# Patient Record
Sex: Male | Born: 1935 | Race: White | Hispanic: No | Marital: Married | Smoking: Former smoker
Health system: Southern US, Community
[De-identification: ages and names within clinical notes are randomized; demographics above are authoritative.]

## PROBLEM LIST (undated history)

## (undated) DIAGNOSIS — D649 Anemia, unspecified: Secondary | ICD-10-CM

## (undated) DIAGNOSIS — Z8701 Personal history of pneumonia (recurrent): Secondary | ICD-10-CM

## (undated) DIAGNOSIS — I2510 Atherosclerotic heart disease of native coronary artery without angina pectoris: Secondary | ICD-10-CM

## (undated) DIAGNOSIS — I10 Essential (primary) hypertension: Secondary | ICD-10-CM

## (undated) DIAGNOSIS — F17200 Nicotine dependence, unspecified, uncomplicated: Secondary | ICD-10-CM

## (undated) DIAGNOSIS — E538 Deficiency of other specified B group vitamins: Secondary | ICD-10-CM

## (undated) DIAGNOSIS — J449 Chronic obstructive pulmonary disease, unspecified: Secondary | ICD-10-CM

## (undated) DIAGNOSIS — I5022 Chronic systolic (congestive) heart failure: Secondary | ICD-10-CM

## (undated) DIAGNOSIS — K5730 Diverticulosis of large intestine without perforation or abscess without bleeding: Secondary | ICD-10-CM

## (undated) DIAGNOSIS — E785 Hyperlipidemia, unspecified: Secondary | ICD-10-CM

## (undated) DIAGNOSIS — M1990 Unspecified osteoarthritis, unspecified site: Secondary | ICD-10-CM

## (undated) DIAGNOSIS — N183 Chronic kidney disease, stage 3 unspecified: Secondary | ICD-10-CM

## (undated) DIAGNOSIS — F1010 Alcohol abuse, uncomplicated: Secondary | ICD-10-CM

## (undated) DIAGNOSIS — I255 Ischemic cardiomyopathy: Secondary | ICD-10-CM

## (undated) DIAGNOSIS — D696 Thrombocytopenia, unspecified: Secondary | ICD-10-CM

## (undated) DIAGNOSIS — I2781 Cor pulmonale (chronic): Secondary | ICD-10-CM

## (undated) HISTORY — DX: Thrombocytopenia, unspecified: D69.6

## (undated) HISTORY — DX: Hyperlipidemia, unspecified: E78.5

## (undated) HISTORY — DX: Chronic obstructive pulmonary disease, unspecified: J44.9

## (undated) HISTORY — DX: Anemia, unspecified: D64.9

## (undated) HISTORY — PX: MRI: SHX5353

## (undated) HISTORY — DX: Alcohol abuse, uncomplicated: F10.10

## (undated) HISTORY — PX: ESOPHAGOGASTRODUODENOSCOPY: SHX1529

## (undated) HISTORY — DX: Deficiency of other specified B group vitamins: E53.8

## (undated) HISTORY — DX: Diverticulosis of large intestine without perforation or abscess without bleeding: K57.30

## (undated) HISTORY — DX: Cor pulmonale (chronic): I27.81

## (undated) HISTORY — DX: Essential (primary) hypertension: I10

## (undated) HISTORY — DX: Unspecified osteoarthritis, unspecified site: M19.90

## (undated) HISTORY — DX: Nicotine dependence, unspecified, uncomplicated: F17.200

## (undated) HISTORY — DX: Atherosclerotic heart disease of native coronary artery without angina pectoris: I25.10

## (undated) HISTORY — PX: CARDIAC CATHETERIZATION: SHX172

---

## 1999-06-15 ENCOUNTER — Inpatient Hospital Stay (HOSPITAL_COMMUNITY): Admission: AD | Admit: 1999-06-15 | Discharge: 1999-06-22 | Payer: Self-pay | Admitting: Cardiology

## 1999-10-13 ENCOUNTER — Encounter: Payer: Self-pay | Admitting: Family Medicine

## 1999-10-13 ENCOUNTER — Ambulatory Visit (HOSPITAL_COMMUNITY): Admission: RE | Admit: 1999-10-13 | Discharge: 1999-10-13 | Payer: Self-pay | Admitting: Family Medicine

## 2000-01-01 ENCOUNTER — Inpatient Hospital Stay (HOSPITAL_COMMUNITY): Admission: EM | Admit: 2000-01-01 | Discharge: 2000-01-09 | Payer: Self-pay | Admitting: Emergency Medicine

## 2000-01-01 ENCOUNTER — Encounter: Payer: Self-pay | Admitting: Family Medicine

## 2000-11-26 ENCOUNTER — Encounter: Payer: Self-pay | Admitting: *Deleted

## 2000-11-26 ENCOUNTER — Inpatient Hospital Stay (HOSPITAL_COMMUNITY): Admission: EM | Admit: 2000-11-26 | Discharge: 2000-11-30 | Payer: Self-pay | Admitting: *Deleted

## 2000-11-27 ENCOUNTER — Encounter: Payer: Self-pay | Admitting: Cardiology

## 2000-11-29 ENCOUNTER — Encounter: Payer: Self-pay | Admitting: *Deleted

## 2002-04-15 ENCOUNTER — Ambulatory Visit (HOSPITAL_BASED_OUTPATIENT_CLINIC_OR_DEPARTMENT_OTHER): Admission: RE | Admit: 2002-04-15 | Discharge: 2002-04-15 | Payer: Self-pay | Admitting: *Deleted

## 2002-04-15 ENCOUNTER — Encounter (INDEPENDENT_AMBULATORY_CARE_PROVIDER_SITE_OTHER): Payer: Self-pay | Admitting: Specialist

## 2003-03-09 ENCOUNTER — Encounter: Payer: Self-pay | Admitting: Family Medicine

## 2003-03-09 ENCOUNTER — Encounter: Admission: RE | Admit: 2003-03-09 | Discharge: 2003-03-09 | Payer: Self-pay | Admitting: Family Medicine

## 2004-02-15 ENCOUNTER — Encounter: Payer: Self-pay | Admitting: Family Medicine

## 2004-07-04 ENCOUNTER — Encounter: Admission: RE | Admit: 2004-07-04 | Discharge: 2004-07-04 | Payer: Self-pay | Admitting: Nephrology

## 2004-12-14 ENCOUNTER — Ambulatory Visit: Payer: Self-pay | Admitting: Family Medicine

## 2004-12-14 ENCOUNTER — Encounter: Admission: RE | Admit: 2004-12-14 | Discharge: 2004-12-14 | Payer: Self-pay | Admitting: Family Medicine

## 2005-01-18 ENCOUNTER — Ambulatory Visit: Payer: Self-pay | Admitting: Family Medicine

## 2005-03-09 ENCOUNTER — Inpatient Hospital Stay (HOSPITAL_COMMUNITY): Admission: EM | Admit: 2005-03-09 | Discharge: 2005-03-16 | Payer: Self-pay | Admitting: *Deleted

## 2005-03-09 ENCOUNTER — Ambulatory Visit: Payer: Self-pay | Admitting: Internal Medicine

## 2005-03-11 ENCOUNTER — Ambulatory Visit: Payer: Self-pay | Admitting: Internal Medicine

## 2005-03-14 ENCOUNTER — Encounter (INDEPENDENT_AMBULATORY_CARE_PROVIDER_SITE_OTHER): Payer: Self-pay | Admitting: Specialist

## 2005-03-30 ENCOUNTER — Ambulatory Visit: Payer: Self-pay | Admitting: Family Medicine

## 2005-03-31 ENCOUNTER — Ambulatory Visit: Payer: Self-pay | Admitting: Cardiovascular Disease

## 2005-07-31 ENCOUNTER — Ambulatory Visit: Payer: Self-pay | Admitting: Family Medicine

## 2005-09-14 ENCOUNTER — Ambulatory Visit: Payer: Self-pay | Admitting: Family Medicine

## 2005-10-04 ENCOUNTER — Ambulatory Visit: Payer: Self-pay | Admitting: Cardiovascular Disease

## 2005-11-03 ENCOUNTER — Ambulatory Visit: Payer: Self-pay | Admitting: Family Medicine

## 2006-03-26 ENCOUNTER — Ambulatory Visit: Payer: Self-pay | Admitting: Cardiovascular Disease

## 2006-03-27 ENCOUNTER — Ambulatory Visit: Payer: Self-pay | Admitting: Internal Medicine

## 2006-05-08 ENCOUNTER — Ambulatory Visit: Payer: Self-pay | Admitting: Internal Medicine

## 2006-09-12 ENCOUNTER — Ambulatory Visit: Payer: Self-pay | Admitting: Family Medicine

## 2006-09-20 ENCOUNTER — Ambulatory Visit: Payer: Self-pay | Admitting: Cardiovascular Disease

## 2006-12-21 ENCOUNTER — Ambulatory Visit: Payer: Self-pay | Admitting: Family Medicine

## 2007-02-13 ENCOUNTER — Ambulatory Visit: Payer: Self-pay | Admitting: Family Medicine

## 2007-03-05 ENCOUNTER — Ambulatory Visit: Payer: Self-pay | Admitting: Family Medicine

## 2007-03-05 LAB — CONVERTED CEMR LAB
Albumin: 3.3 g/dL — ABNORMAL LOW (ref 3.5–5.2)
BUN: 28 mg/dL — ABNORMAL HIGH (ref 6–23)
Basophils Absolute: 0 10*3/uL (ref 0.0–0.1)
CO2: 34 meq/L — ABNORMAL HIGH (ref 19–32)
Calcium: 8.7 mg/dL (ref 8.4–10.5)
Cholesterol: 144 mg/dL (ref 0–200)
Creatinine, Ser: 1.5 mg/dL (ref 0.4–1.5)
Eosinophils Absolute: 0.3 10*3/uL (ref 0.0–0.6)
Eosinophils Relative: 4.6 % (ref 0.0–5.0)
GFR calc non Af Amer: 48 mL/min
Glucose, Bld: 106 mg/dL — ABNORMAL HIGH (ref 70–99)
HCT: 38.5 % — ABNORMAL LOW (ref 39.0–52.0)
HDL: 65.7 mg/dL (ref >39.0)
Lymphocytes Relative: 17.2 % (ref 12.0–46.0)
MCHC: 33.5 g/dL (ref 30.0–36.0)
Neutro Abs: 4.1 10*3/uL (ref 1.4–7.7)
Neutrophils Relative %: 68.8 % (ref 43.0–77.0)
Phosphorus: 3.4 mg/dL (ref 2.3–4.6)
Sodium: 143 meq/L (ref 135–145)
Triglycerides: 95 mg/dL (ref 0–149)
VLDL: 19 mg/dL (ref 0–40)
WBC: 5.9 10*3/uL (ref 4.5–10.5)

## 2007-08-23 ENCOUNTER — Ambulatory Visit: Payer: Self-pay | Admitting: Family Medicine

## 2007-10-08 ENCOUNTER — Telehealth: Payer: Self-pay | Admitting: Family Medicine

## 2007-10-21 ENCOUNTER — Encounter: Payer: Self-pay | Admitting: Family Medicine

## 2007-10-21 DIAGNOSIS — M1990 Unspecified osteoarthritis, unspecified site: Secondary | ICD-10-CM | POA: Insufficient documentation

## 2007-10-21 DIAGNOSIS — F17200 Nicotine dependence, unspecified, uncomplicated: Secondary | ICD-10-CM

## 2007-10-21 DIAGNOSIS — I2510 Atherosclerotic heart disease of native coronary artery without angina pectoris: Secondary | ICD-10-CM

## 2007-10-21 DIAGNOSIS — I10 Essential (primary) hypertension: Secondary | ICD-10-CM

## 2007-10-21 DIAGNOSIS — F1011 Alcohol abuse, in remission: Secondary | ICD-10-CM

## 2007-10-21 DIAGNOSIS — M109 Gout, unspecified: Secondary | ICD-10-CM

## 2007-10-21 DIAGNOSIS — I279 Pulmonary heart disease, unspecified: Secondary | ICD-10-CM | POA: Insufficient documentation

## 2007-10-21 DIAGNOSIS — H9190 Unspecified hearing loss, unspecified ear: Secondary | ICD-10-CM | POA: Insufficient documentation

## 2007-10-21 DIAGNOSIS — D649 Anemia, unspecified: Secondary | ICD-10-CM

## 2007-10-21 DIAGNOSIS — K5730 Diverticulosis of large intestine without perforation or abscess without bleeding: Secondary | ICD-10-CM | POA: Insufficient documentation

## 2007-10-21 DIAGNOSIS — J439 Emphysema, unspecified: Secondary | ICD-10-CM

## 2007-10-21 DIAGNOSIS — N289 Disorder of kidney and ureter, unspecified: Secondary | ICD-10-CM

## 2007-10-22 ENCOUNTER — Ambulatory Visit (HOSPITAL_COMMUNITY): Admission: RE | Admit: 2007-10-22 | Discharge: 2007-10-22 | Payer: Self-pay | Admitting: Rheumatology

## 2007-10-22 ENCOUNTER — Ambulatory Visit: Payer: Self-pay | Admitting: Vascular Surgery

## 2007-10-22 ENCOUNTER — Encounter: Payer: Self-pay | Admitting: Family Medicine

## 2007-10-22 ENCOUNTER — Encounter (INDEPENDENT_AMBULATORY_CARE_PROVIDER_SITE_OTHER): Payer: Self-pay | Admitting: Rheumatology

## 2007-11-01 ENCOUNTER — Encounter: Payer: Self-pay | Admitting: Family Medicine

## 2007-11-13 ENCOUNTER — Ambulatory Visit: Payer: Self-pay | Admitting: Family Medicine

## 2008-01-16 ENCOUNTER — Telehealth: Payer: Self-pay | Admitting: Family Medicine

## 2008-01-22 ENCOUNTER — Encounter: Payer: Self-pay | Admitting: Internal Medicine

## 2008-01-27 ENCOUNTER — Telehealth (INDEPENDENT_AMBULATORY_CARE_PROVIDER_SITE_OTHER): Payer: Self-pay | Admitting: *Deleted

## 2008-01-28 ENCOUNTER — Ambulatory Visit: Payer: Self-pay | Admitting: Family Medicine

## 2008-03-03 ENCOUNTER — Encounter: Payer: Self-pay | Admitting: Family Medicine

## 2008-03-13 ENCOUNTER — Telehealth: Payer: Self-pay | Admitting: Family Medicine

## 2008-06-16 ENCOUNTER — Telehealth: Payer: Self-pay | Admitting: Family Medicine

## 2008-06-29 ENCOUNTER — Encounter: Payer: Self-pay | Admitting: Family Medicine

## 2008-07-03 ENCOUNTER — Encounter: Payer: Self-pay | Admitting: Family Medicine

## 2008-08-11 ENCOUNTER — Ambulatory Visit: Payer: Self-pay | Admitting: Family Medicine

## 2008-10-05 ENCOUNTER — Encounter: Payer: Self-pay | Admitting: Internal Medicine

## 2008-10-12 ENCOUNTER — Encounter: Payer: Self-pay | Admitting: Family Medicine

## 2008-12-28 ENCOUNTER — Encounter: Payer: Self-pay | Admitting: Family Medicine

## 2009-01-06 ENCOUNTER — Telehealth (INDEPENDENT_AMBULATORY_CARE_PROVIDER_SITE_OTHER): Payer: Self-pay | Admitting: *Deleted

## 2009-01-14 ENCOUNTER — Telehealth (INDEPENDENT_AMBULATORY_CARE_PROVIDER_SITE_OTHER): Payer: Self-pay | Admitting: *Deleted

## 2009-01-19 ENCOUNTER — Telehealth (INDEPENDENT_AMBULATORY_CARE_PROVIDER_SITE_OTHER): Payer: Self-pay | Admitting: *Deleted

## 2009-01-22 ENCOUNTER — Encounter: Payer: Self-pay | Admitting: Internal Medicine

## 2009-02-02 ENCOUNTER — Ambulatory Visit: Payer: Self-pay | Admitting: Family Medicine

## 2009-02-05 LAB — CONVERTED CEMR LAB
ALT: 13 units/L (ref 0–53)
Albumin: 3.4 g/dL — ABNORMAL LOW (ref 3.5–5.2)
Basophils Relative: 0.1 % (ref 0.0–3.0)
Cholesterol: 119 mg/dL (ref 0–200)
Creatinine, Ser: 1.5 mg/dL (ref 0.4–1.5)
Glucose, Bld: 107 mg/dL — ABNORMAL HIGH (ref 70–99)
HDL: 64.1 mg/dL (ref >39.00)
Lymphocytes Relative: 14.4 % (ref 12.0–46.0)
Lymphs Abs: 0.8 10*3/uL (ref 0.7–4.0)
Monocytes Absolute: 0.6 10*3/uL (ref 0.1–1.0)
Neutro Abs: 4.4 10*3/uL (ref 1.4–7.7)
Neutrophils Relative %: 74.1 % (ref 43.0–77.0)
Phosphorus: 3.5 mg/dL (ref 2.3–4.6)
Potassium: 4.7 meq/L (ref 3.5–5.1)
Sodium: 143 meq/L (ref 135–145)
TSH: 1.69 microintl units/mL (ref 0.35–5.50)
Total Bilirubin: 0.9 mg/dL (ref 0.3–1.2)
Total CHOL/HDL Ratio: 2
Total Protein: 5.7 g/dL — ABNORMAL LOW (ref 6.0–8.3)
Triglycerides: 51 mg/dL (ref 0.0–149.0)
VLDL: 10.2 mg/dL (ref 0.0–40.0)

## 2009-02-16 ENCOUNTER — Telehealth: Payer: Self-pay | Admitting: Family Medicine

## 2009-02-19 ENCOUNTER — Ambulatory Visit: Payer: Self-pay | Admitting: Family Medicine

## 2009-02-19 DIAGNOSIS — D696 Thrombocytopenia, unspecified: Secondary | ICD-10-CM | POA: Insufficient documentation

## 2009-02-22 LAB — CONVERTED CEMR LAB
Basophils Relative: 0.3 % (ref 0.0–3.0)
Eosinophils Relative: 2.5 % (ref 0.0–5.0)
HCT: 36.1 % — ABNORMAL LOW (ref 39.0–52.0)
Lymphs Abs: 1.1 10*3/uL (ref 0.7–4.0)
Neutro Abs: 4.4 10*3/uL (ref 1.4–7.7)
Platelets: 124 10*3/uL — ABNORMAL LOW (ref 150.0–400.0)
RDW: 13.8 % (ref 11.5–14.6)

## 2009-02-24 ENCOUNTER — Ambulatory Visit: Payer: Self-pay | Admitting: Oncology

## 2009-03-17 ENCOUNTER — Encounter: Payer: Self-pay | Admitting: Family Medicine

## 2009-03-17 LAB — CMP (CANCER CENTER ONLY)
ALT(SGPT): 9 U/L — ABNORMAL LOW (ref 10–47)
AST: 21 U/L (ref 11–38)
Albumin: 3.3 g/dL (ref 3.3–5.5)
Alkaline Phosphatase: 61 U/L (ref 26–84)
Glucose, Bld: 109 mg/dL (ref 73–118)
Potassium: 4 mEq/L (ref 3.3–4.7)
Sodium: 139 mEq/L (ref 128–145)
Total Bilirubin: 0.8 mg/dl (ref 0.20–1.60)
Total Protein: 5.8 g/dL — ABNORMAL LOW (ref 6.4–8.1)

## 2009-03-17 LAB — CBC WITH DIFFERENTIAL (CANCER CENTER ONLY)
BASO%: 0.3 % (ref 0.0–2.0)
HCT: 36.1 % — ABNORMAL LOW (ref 38.7–49.9)
LYMPH%: 18.6 % (ref 14.0–48.0)
MCH: 32.3 pg (ref 28.0–33.4)
MCHC: 34.3 g/dL (ref 32.0–35.9)
MCV: 94 fL (ref 82–98)
MONO#: 0.5 10*3/uL (ref 0.1–0.9)
MONO%: 7.3 % (ref 0.0–13.0)
NEUT%: 72 % (ref 40.0–80.0)
Platelets: 131 10*3/uL — ABNORMAL LOW (ref 145–400)
RDW: 12.4 % (ref 10.5–14.6)
WBC: 6.3 10*3/uL (ref 4.0–10.0)

## 2009-03-17 LAB — MORPHOLOGY - CHCC SATELLITE

## 2009-03-19 LAB — RETICULOCYTES (CHCC)
ABS Retic: 31.4 10*3/uL (ref 19.0–186.0)
RBC.: 3.92 MIL/uL — ABNORMAL LOW (ref 4.22–5.81)
Retic Ct Pct: 0.8 % (ref 0.4–3.1)

## 2009-03-19 LAB — PROTEIN ELECTROPHORESIS, SERUM
Albumin ELP: 65.9 % (ref 55.8–66.1)
Total Protein, Serum Electrophoresis: 5.5 g/dL — ABNORMAL LOW (ref 6.0–8.3)

## 2009-03-19 LAB — FOLATE: Folate: 10 ng/mL

## 2009-03-19 LAB — IRON AND TIBC: %SAT: 43 % (ref 20–55)

## 2009-03-19 LAB — VITAMIN B12: Vitamin B-12: 179 pg/mL — ABNORMAL LOW (ref 211–911)

## 2009-03-23 ENCOUNTER — Ambulatory Visit (HOSPITAL_COMMUNITY): Admission: RE | Admit: 2009-03-23 | Discharge: 2009-03-23 | Payer: Self-pay | Admitting: Oncology

## 2009-03-24 ENCOUNTER — Encounter: Payer: Self-pay | Admitting: Internal Medicine

## 2009-03-31 ENCOUNTER — Encounter: Payer: Self-pay | Admitting: Internal Medicine

## 2009-04-02 ENCOUNTER — Encounter: Payer: Self-pay | Admitting: Internal Medicine

## 2009-04-09 ENCOUNTER — Ambulatory Visit: Payer: Self-pay | Admitting: Oncology

## 2009-04-14 ENCOUNTER — Encounter: Payer: Self-pay | Admitting: Family Medicine

## 2009-04-14 LAB — CBC WITH DIFFERENTIAL (CANCER CENTER ONLY)
BASO#: 0 10*3/uL (ref 0.0–0.2)
Eosinophils Absolute: 0.2 10*3/uL (ref 0.0–0.5)
HCT: 36.1 % — ABNORMAL LOW (ref 38.7–49.9)
HGB: 12.4 g/dL — ABNORMAL LOW (ref 13.0–17.1)
LYMPH%: 20.4 % (ref 14.0–48.0)
MCH: 31.7 pg (ref 28.0–33.4)
MCV: 92 fL (ref 82–98)
MONO#: 0.5 10*3/uL (ref 0.1–0.9)
MONO%: 7.8 % (ref 0.0–13.0)
NEUT%: 68.7 % (ref 40.0–80.0)
Platelets: 132 10*3/uL — ABNORMAL LOW (ref 145–400)
RBC: 3.91 10*6/uL — ABNORMAL LOW (ref 4.20–5.70)
WBC: 6.9 10*3/uL (ref 4.0–10.0)

## 2009-04-16 ENCOUNTER — Ambulatory Visit (HOSPITAL_COMMUNITY): Admission: RE | Admit: 2009-04-16 | Discharge: 2009-04-16 | Payer: Self-pay | Admitting: Oncology

## 2009-05-11 ENCOUNTER — Ambulatory Visit: Payer: Self-pay | Admitting: Oncology

## 2009-05-31 ENCOUNTER — Telehealth: Payer: Self-pay | Admitting: Family Medicine

## 2009-06-10 ENCOUNTER — Ambulatory Visit: Payer: Self-pay | Admitting: Oncology

## 2009-07-04 ENCOUNTER — Inpatient Hospital Stay (HOSPITAL_COMMUNITY): Admission: EM | Admit: 2009-07-04 | Discharge: 2009-07-10 | Payer: Self-pay | Admitting: Emergency Medicine

## 2009-07-04 ENCOUNTER — Ambulatory Visit: Payer: Self-pay | Admitting: Cardiology

## 2009-07-07 ENCOUNTER — Ambulatory Visit: Payer: Self-pay | Admitting: Internal Medicine

## 2009-07-07 ENCOUNTER — Encounter: Payer: Self-pay | Admitting: Cardiovascular Disease

## 2009-07-07 ENCOUNTER — Encounter: Payer: Self-pay | Admitting: Emergency Medicine

## 2009-07-08 ENCOUNTER — Ambulatory Visit: Payer: Self-pay | Admitting: Internal Medicine

## 2009-07-08 ENCOUNTER — Encounter: Payer: Self-pay | Admitting: Family Medicine

## 2009-07-08 ENCOUNTER — Telehealth (INDEPENDENT_AMBULATORY_CARE_PROVIDER_SITE_OTHER): Payer: Self-pay

## 2009-07-13 ENCOUNTER — Ambulatory Visit: Payer: Self-pay | Admitting: Oncology

## 2009-07-14 ENCOUNTER — Encounter: Payer: Self-pay | Admitting: Family Medicine

## 2009-07-14 LAB — CBC WITH DIFFERENTIAL (CANCER CENTER ONLY)
BASO#: 0 10*3/uL (ref 0.0–0.2)
Eosinophils Absolute: 0.3 10*3/uL (ref 0.0–0.5)
HGB: 10.6 g/dL — ABNORMAL LOW (ref 13.0–17.1)
MCH: 30.7 pg (ref 28.0–33.4)
MONO#: 0.4 10*3/uL (ref 0.1–0.9)
MONO%: 6.7 % (ref 0.0–13.0)
NEUT#: 3.9 10*3/uL (ref 1.5–6.5)
Platelets: 170 10*3/uL (ref 145–400)
RBC: 3.46 10*6/uL — ABNORMAL LOW (ref 4.20–5.70)
WBC: 5.7 10*3/uL (ref 4.0–10.0)

## 2009-07-23 ENCOUNTER — Ambulatory Visit: Payer: Self-pay | Admitting: Family Medicine

## 2009-07-26 ENCOUNTER — Telehealth (INDEPENDENT_AMBULATORY_CARE_PROVIDER_SITE_OTHER): Payer: Self-pay | Admitting: Radiology

## 2009-07-27 ENCOUNTER — Ambulatory Visit: Payer: Self-pay

## 2009-07-27 ENCOUNTER — Encounter: Payer: Self-pay | Admitting: Cardiology

## 2009-07-29 DIAGNOSIS — I214 Non-ST elevation (NSTEMI) myocardial infarction: Secondary | ICD-10-CM

## 2009-07-29 DIAGNOSIS — I428 Other cardiomyopathies: Secondary | ICD-10-CM | POA: Insufficient documentation

## 2009-07-29 DIAGNOSIS — E538 Deficiency of other specified B group vitamins: Secondary | ICD-10-CM

## 2009-07-29 DIAGNOSIS — E785 Hyperlipidemia, unspecified: Secondary | ICD-10-CM | POA: Insufficient documentation

## 2009-07-30 ENCOUNTER — Ambulatory Visit: Payer: Self-pay | Admitting: Cardiovascular Disease

## 2009-08-11 ENCOUNTER — Ambulatory Visit: Payer: Self-pay | Admitting: Oncology

## 2009-08-13 ENCOUNTER — Encounter: Payer: Self-pay | Admitting: Family Medicine

## 2009-08-13 LAB — CBC WITH DIFFERENTIAL (CANCER CENTER ONLY)
BASO#: 0 10*3/uL (ref 0.0–0.2)
EOS%: 4.8 % (ref 0.0–7.0)
Eosinophils Absolute: 0.3 10*3/uL (ref 0.0–0.5)
HGB: 12.2 g/dL — ABNORMAL LOW (ref 13.0–17.1)
LYMPH#: 1.2 10*3/uL (ref 0.9–3.3)
NEUT#: 4.3 10*3/uL (ref 1.5–6.5)
Platelets: 121 10*3/uL — ABNORMAL LOW (ref 145–400)
RBC: 3.95 10*6/uL — ABNORMAL LOW (ref 4.20–5.70)

## 2009-08-13 LAB — BASIC METABOLIC PANEL - CANCER CENTER ONLY
CO2: 34 mEq/L — ABNORMAL HIGH (ref 18–33)
Calcium: 9.3 mg/dL (ref 8.0–10.3)
Chloride: 100 mEq/L (ref 98–108)
Glucose, Bld: 113 mg/dL (ref 73–118)
Sodium: 148 mEq/L — ABNORMAL HIGH (ref 128–145)

## 2009-08-18 ENCOUNTER — Ambulatory Visit: Payer: Self-pay | Admitting: Family Medicine

## 2009-08-23 ENCOUNTER — Encounter: Payer: Self-pay | Admitting: Family Medicine

## 2009-09-09 ENCOUNTER — Ambulatory Visit: Payer: Self-pay | Admitting: Oncology

## 2009-09-27 ENCOUNTER — Telehealth: Payer: Self-pay | Admitting: Cardiovascular Disease

## 2009-10-12 ENCOUNTER — Ambulatory Visit: Payer: Self-pay | Admitting: Oncology

## 2009-11-10 ENCOUNTER — Ambulatory Visit: Payer: Self-pay | Admitting: Oncology

## 2009-11-11 ENCOUNTER — Ambulatory Visit: Payer: Self-pay | Admitting: Internal Medicine

## 2009-12-10 ENCOUNTER — Ambulatory Visit: Payer: Self-pay | Admitting: Oncology

## 2009-12-16 ENCOUNTER — Encounter: Payer: Self-pay | Admitting: Family Medicine

## 2009-12-16 LAB — CBC WITH DIFFERENTIAL (CANCER CENTER ONLY)
BASO%: 0.4 % (ref 0.0–2.0)
EOS%: 2.8 % (ref 0.0–7.0)
HCT: 36 % — ABNORMAL LOW (ref 38.7–49.9)
LYMPH#: 0.9 10*3/uL (ref 0.9–3.3)
MCHC: 33.4 g/dL (ref 32.0–35.9)
MONO#: 0.5 10*3/uL (ref 0.1–0.9)
NEUT#: 5.2 10*3/uL (ref 1.5–6.5)
NEUT%: 76.4 % (ref 40.0–80.0)
Platelets: 126 10*3/uL — ABNORMAL LOW (ref 145–400)
RDW: 14.7 % — ABNORMAL HIGH (ref 10.5–14.6)
WBC: 6.8 10*3/uL (ref 4.0–10.0)

## 2009-12-16 LAB — BASIC METABOLIC PANEL - CANCER CENTER ONLY
CO2: 35 mEq/L — ABNORMAL HIGH (ref 18–33)
Chloride: 102 mEq/L (ref 98–108)
Creat: 1.7 mg/dl — ABNORMAL HIGH (ref 0.6–1.2)
Potassium: 4.2 mEq/L (ref 3.3–4.7)
Sodium: 145 mEq/L (ref 128–145)

## 2010-01-11 ENCOUNTER — Ambulatory Visit: Payer: Self-pay | Admitting: Oncology

## 2010-01-19 ENCOUNTER — Ambulatory Visit: Payer: Self-pay | Admitting: Cardiovascular Disease

## 2010-01-24 ENCOUNTER — Telehealth: Payer: Self-pay | Admitting: Family Medicine

## 2010-02-11 ENCOUNTER — Ambulatory Visit: Payer: Self-pay | Admitting: Oncology

## 2010-02-24 ENCOUNTER — Telehealth: Payer: Self-pay | Admitting: Family Medicine

## 2010-02-24 ENCOUNTER — Encounter: Payer: Self-pay | Admitting: Family Medicine

## 2010-02-25 ENCOUNTER — Ambulatory Visit: Payer: Self-pay | Admitting: Family Medicine

## 2010-02-25 DIAGNOSIS — R5383 Other fatigue: Secondary | ICD-10-CM

## 2010-02-25 DIAGNOSIS — R5381 Other malaise: Secondary | ICD-10-CM

## 2010-02-25 DIAGNOSIS — R3915 Urgency of urination: Secondary | ICD-10-CM

## 2010-02-25 LAB — CONVERTED CEMR LAB
Bilirubin Urine: NEGATIVE
Casts: 0 /lpf
Glucose, Urine, Semiquant: NEGATIVE
Ketones, urine, test strip: NEGATIVE
Nitrite: NEGATIVE
Specific Gravity, Urine: 1.01
WBC Urine, dipstick: NEGATIVE
Yeast, UA: 0

## 2010-02-26 ENCOUNTER — Encounter: Payer: Self-pay | Admitting: Family Medicine

## 2010-03-01 LAB — CONVERTED CEMR LAB
ALT: 44 units/L (ref 0–53)
AST: 33 units/L (ref 0–37)
Albumin: 3.1 g/dL — ABNORMAL LOW (ref 3.5–5.2)
Glucose, Bld: 109 mg/dL — ABNORMAL HIGH (ref 70–99)
HCT: 32.7 % — ABNORMAL LOW (ref 39.0–52.0)
Hemoglobin: 11.1 g/dL — ABNORMAL LOW (ref 13.0–17.0)
MCV: 95.5 fL (ref 78.0–100.0)
Neutro Abs: 7 10*3/uL (ref 1.4–7.7)
Neutrophils Relative %: 81.4 % — ABNORMAL HIGH (ref 43.0–77.0)
Phosphorus: 3.8 mg/dL (ref 2.3–4.6)
Platelets: 162 10*3/uL (ref 150.0–400.0)
Potassium: 4.9 meq/L (ref 3.5–5.1)
Pro B Natriuretic peptide (BNP): 142 pg/mL — ABNORMAL HIGH (ref 0.0–100.0)
RDW: 15.7 % — ABNORMAL HIGH (ref 11.5–14.6)
Sodium: 137 meq/L (ref 135–145)
TSH: 1.93 microintl units/mL (ref 0.35–5.50)
Total Protein: 5.6 g/dL — ABNORMAL LOW (ref 6.0–8.3)
Vitamin B-12: >1500 pg/mL — ABNORMAL HIGH (ref 211–911)

## 2010-03-08 ENCOUNTER — Ambulatory Visit: Payer: Self-pay | Admitting: Cardiovascular Disease

## 2010-03-10 ENCOUNTER — Telehealth: Payer: Self-pay | Admitting: Cardiovascular Disease

## 2010-03-10 LAB — CONVERTED CEMR LAB
BUN: 30 mg/dL — ABNORMAL HIGH (ref 6–23)
CO2: 35 meq/L — ABNORMAL HIGH (ref 19–32)
Calcium: 8.4 mg/dL (ref 8.4–10.5)
Creatinine, Ser: 1.6 mg/dL — ABNORMAL HIGH (ref 0.4–1.5)
Pro B Natriuretic peptide (BNP): 102 pg/mL — ABNORMAL HIGH (ref 0.0–100.0)

## 2010-03-14 ENCOUNTER — Telehealth (INDEPENDENT_AMBULATORY_CARE_PROVIDER_SITE_OTHER): Payer: Self-pay | Admitting: *Deleted

## 2010-03-14 ENCOUNTER — Ambulatory Visit: Payer: Self-pay | Admitting: Oncology

## 2010-03-24 ENCOUNTER — Ambulatory Visit: Payer: Self-pay | Admitting: Internal Medicine

## 2010-03-24 ENCOUNTER — Inpatient Hospital Stay (HOSPITAL_COMMUNITY): Admission: EM | Admit: 2010-03-24 | Discharge: 2010-03-26 | Payer: Self-pay | Admitting: Emergency Medicine

## 2010-03-25 ENCOUNTER — Ambulatory Visit (HOSPITAL_COMMUNITY): Admission: RE | Admit: 2010-03-25 | Discharge: 2010-03-25 | Payer: Self-pay | Admitting: Cardiovascular Disease

## 2010-04-05 ENCOUNTER — Telehealth: Payer: Self-pay | Admitting: Family Medicine

## 2010-04-12 ENCOUNTER — Ambulatory Visit: Payer: Self-pay | Admitting: Cardiovascular Disease

## 2010-04-13 ENCOUNTER — Ambulatory Visit: Payer: Self-pay | Admitting: Oncology

## 2010-04-15 ENCOUNTER — Encounter: Payer: Self-pay | Admitting: Family Medicine

## 2010-04-15 LAB — CBC WITH DIFFERENTIAL (CANCER CENTER ONLY)
BASO%: 0.4 % (ref 0.0–2.0)
Eosinophils Absolute: 0.2 10*3/uL (ref 0.0–0.5)
LYMPH%: 15.8 % (ref 14.0–48.0)
MCH: 31.3 pg (ref 28.0–33.4)
MCV: 94 fL (ref 82–98)
MONO%: 7.2 % (ref 0.0–13.0)
NEUT#: 5.3 10*3/uL (ref 1.5–6.5)
Platelets: 149 10*3/uL (ref 145–400)
RBC: 3.71 10*6/uL — ABNORMAL LOW (ref 4.20–5.70)
RDW: 14.4 % (ref 10.5–14.6)
WBC: 7.1 10*3/uL (ref 4.0–10.0)

## 2010-04-15 LAB — VITAMIN B12: Vitamin B-12: 493 pg/mL (ref 211–911)

## 2010-04-28 ENCOUNTER — Encounter: Payer: Self-pay | Admitting: Family Medicine

## 2010-05-13 ENCOUNTER — Ambulatory Visit: Payer: Self-pay | Admitting: Oncology

## 2010-05-31 ENCOUNTER — Encounter: Payer: Self-pay | Admitting: Family Medicine

## 2010-06-08 ENCOUNTER — Encounter: Payer: Self-pay | Admitting: Family Medicine

## 2010-07-07 ENCOUNTER — Ambulatory Visit: Payer: Self-pay | Admitting: Oncology

## 2010-07-11 ENCOUNTER — Ambulatory Visit: Payer: Self-pay | Admitting: Cardiovascular Disease

## 2010-07-11 DIAGNOSIS — T82190A Other mechanical complication of cardiac electrode, initial encounter: Secondary | ICD-10-CM | POA: Insufficient documentation

## 2010-07-11 DIAGNOSIS — R609 Edema, unspecified: Secondary | ICD-10-CM

## 2010-07-13 ENCOUNTER — Telehealth: Payer: Self-pay | Admitting: Cardiovascular Disease

## 2010-07-13 LAB — CONVERTED CEMR LAB
Calcium: 8.5 mg/dL (ref 8.4–10.5)
Creatinine, Ser: 1.4 mg/dL (ref 0.4–1.5)
GFR calc non Af Amer: 51.32 mL/min (ref >60)

## 2010-07-25 ENCOUNTER — Encounter: Payer: Self-pay | Admitting: Family Medicine

## 2010-08-21 IMAGING — CR DG CHEST 1V PORT
1 series · 1 of 1 positions shown · non-contrast
Comparison: Chest radiograph 11/11/2009

CLINICAL DATA: Chest pain

PORTABLE CHEST - 1 VIEW

[view not recorded]
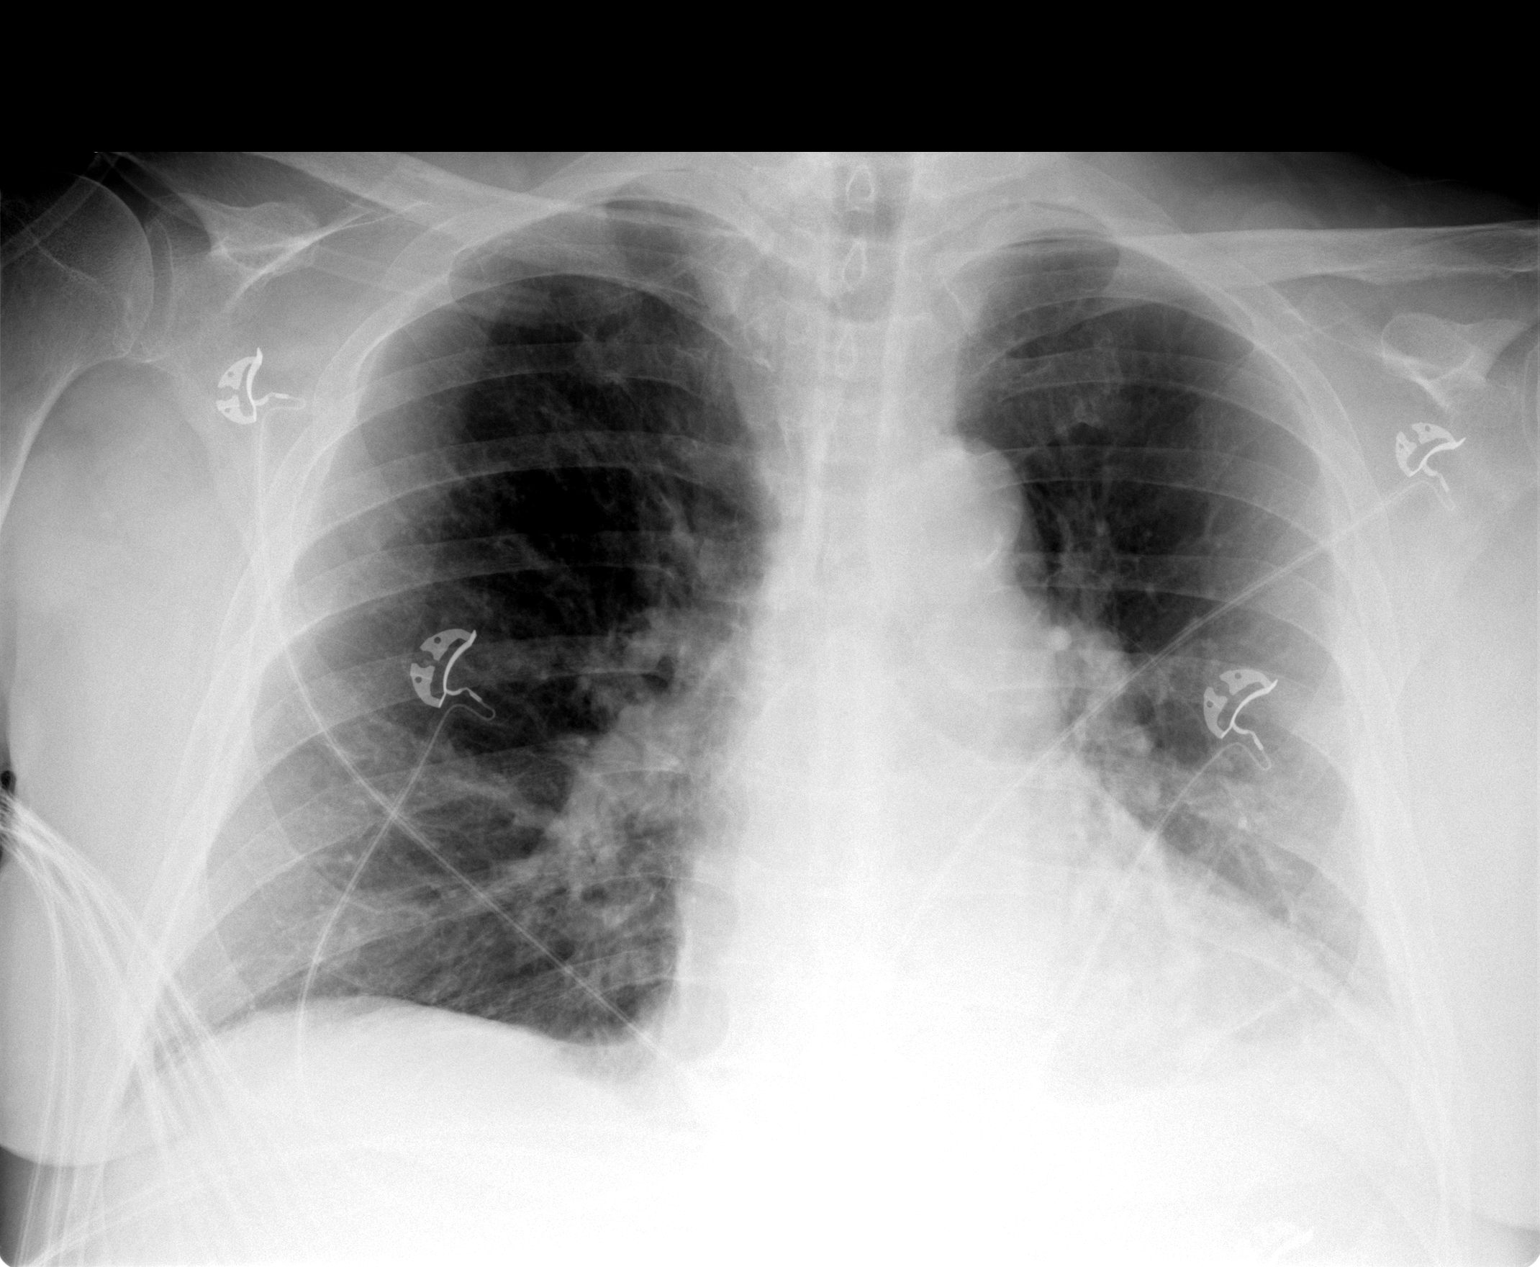

[1 of 1 positions shown; findings below may reference images not displayed]

FINDINGS: Stable mildly enlarged cardiac silhouette.  Costophrenic
angles are clear.  There is bibasilar atelectasis.  Small left
effusion.  No evidence effusion, infiltrate, or pneumothorax.
IMPRESSION: 1. Small left base effusion.

2.  Bibasilar atelectasis.

## 2010-08-30 ENCOUNTER — Ambulatory Visit: Payer: Self-pay | Admitting: Oncology

## 2010-09-01 ENCOUNTER — Ambulatory Visit: Payer: Self-pay | Admitting: Family Medicine

## 2010-09-01 LAB — CBC WITH DIFFERENTIAL (CANCER CENTER ONLY)
BASO#: 0 10*3/uL (ref 0.0–0.2)
EOS%: 0.9 % (ref 0.0–7.0)
HGB: 12.6 g/dL — ABNORMAL LOW (ref 13.0–17.1)
LYMPH#: 0.8 10*3/uL — ABNORMAL LOW (ref 0.9–3.3)
MCH: 31.2 pg (ref 28.0–33.4)
MCHC: 33.6 g/dL (ref 32.0–35.9)
MCV: 93 fL (ref 82–98)
MONO%: 6.6 % (ref 0.0–13.0)
NEUT#: 6.6 10*3/uL — ABNORMAL HIGH (ref 1.5–6.5)
RBC: 4.03 10*6/uL — ABNORMAL LOW (ref 4.20–5.70)

## 2010-09-07 ENCOUNTER — Ambulatory Visit: Payer: Self-pay | Admitting: Vascular Surgery

## 2010-09-07 ENCOUNTER — Encounter (INDEPENDENT_AMBULATORY_CARE_PROVIDER_SITE_OTHER): Payer: Self-pay | Admitting: Rheumatology

## 2010-09-07 ENCOUNTER — Ambulatory Visit (HOSPITAL_COMMUNITY): Admission: RE | Admit: 2010-09-07 | Discharge: 2010-09-07 | Payer: Self-pay | Admitting: Rheumatology

## 2010-09-28 ENCOUNTER — Telehealth: Payer: Self-pay | Admitting: Family Medicine

## 2010-09-29 ENCOUNTER — Ambulatory Visit: Payer: Self-pay | Admitting: Oncology

## 2010-10-27 ENCOUNTER — Ambulatory Visit: Payer: Self-pay | Admitting: Family Medicine

## 2010-11-25 ENCOUNTER — Ambulatory Visit: Payer: Self-pay | Admitting: Oncology

## 2010-11-29 ENCOUNTER — Encounter: Payer: Self-pay | Admitting: Family Medicine

## 2010-11-29 LAB — CBC WITH DIFFERENTIAL/PLATELET
BASO%: 0.4 % (ref 0.0–2.0)
Basophils Absolute: 0 10*3/uL (ref 0.0–0.1)
EOS%: 3.4 % (ref 0.0–7.0)
Eosinophils Absolute: 0.3 10*3/uL (ref 0.0–0.5)
HCT: 36.2 % — ABNORMAL LOW (ref 38.4–49.9)
HGB: 11.7 g/dL — ABNORMAL LOW (ref 13.0–17.1)
LYMPH%: 10.7 % — ABNORMAL LOW (ref 14.0–49.0)
MCH: 31.5 pg (ref 27.2–33.4)
MCHC: 32.4 g/dL (ref 32.0–36.0)
MCV: 96.9 fL (ref 79.3–98.0)
MONO#: 0.6 10*3/uL (ref 0.1–0.9)
MONO%: 7.3 % (ref 0.0–14.0)
NEUT#: 6.3 10*3/uL (ref 1.5–6.5)
NEUT%: 78.2 % — ABNORMAL HIGH (ref 39.0–75.0)
Platelets: 139 10*3/uL — ABNORMAL LOW (ref 140–400)
RBC: 3.73 10*6/uL — ABNORMAL LOW (ref 4.20–5.82)
RDW: 16.1 % — ABNORMAL HIGH (ref 11.0–14.6)
WBC: 8.1 10*3/uL (ref 4.0–10.3)
lymph#: 0.9 10*3/uL (ref 0.9–3.3)

## 2010-11-29 LAB — COMPREHENSIVE METABOLIC PANEL
ALT: <8 U/L (ref 0–53)
AST: 14 U/L (ref 0–37)
Albumin: 4 g/dL (ref 3.5–5.2)
Alkaline Phosphatase: 67 U/L (ref 39–117)
BUN: 44 mg/dL — ABNORMAL HIGH (ref 6–23)
CO2: 35 mEq/L — ABNORMAL HIGH (ref 19–32)
Calcium: 9.3 mg/dL (ref 8.4–10.5)
Chloride: 102 mEq/L (ref 96–112)
Creatinine, Ser: 1.75 mg/dL — ABNORMAL HIGH (ref 0.40–1.50)
Glucose, Bld: 106 mg/dL — ABNORMAL HIGH (ref 70–99)
Potassium: 4.8 mEq/L (ref 3.5–5.3)
Sodium: 143 mEq/L (ref 135–145)
Total Bilirubin: 0.5 mg/dL (ref 0.3–1.2)
Total Protein: 5.7 g/dL — ABNORMAL LOW (ref 6.0–8.3)

## 2010-11-29 LAB — VITAMIN B12: Vitamin B-12: 611 pg/mL (ref 211–911)

## 2010-11-30 ENCOUNTER — Telehealth: Payer: Self-pay | Admitting: Family Medicine

## 2010-12-13 ENCOUNTER — Encounter: Payer: Self-pay | Admitting: Family Medicine

## 2010-12-13 ENCOUNTER — Ambulatory Visit
Admission: RE | Admit: 2010-12-13 | Discharge: 2010-12-13 | Payer: Self-pay | Source: Home / Self Care | Attending: Family Medicine | Admitting: Family Medicine

## 2010-12-13 ENCOUNTER — Telehealth (INDEPENDENT_AMBULATORY_CARE_PROVIDER_SITE_OTHER): Payer: Self-pay | Admitting: *Deleted

## 2010-12-15 NOTE — Assessment & Plan Note (Signed)
Summary: FLU SHOT/TOWER/JRR  Nurse Visit   Allergies: 1)  Penicillin 2)  Cardura  Orders Added: 1)  Flu Vaccine 79yrs + MEDICARE PATIENTS [Q2039] 2)  Administration Flu vaccine - MCR [G0008]        Flu Vaccine Consent Questions     Do you have a history of severe allergic reactions to this vaccine? no    Any prior history of allergic reactions to egg and/or gelatin? no    Do you have a sensitivity to the preservative Thimersol? no    Do you have a past history of Guillan-Barre Syndrome? no    Do you currently have an acute febrile illness? no    Have you ever had a severe reaction to latex? no    Vaccine information given and explained to patient? yes    Are you currently pregnant? no    Lot Number:[id number]   Exp Date:05/13/2011   Site Given  Left Deltoid IM1 Lewanda Rife LPN  September 01, 2010 3:27 PM

## 2010-12-15 NOTE — Progress Notes (Signed)
Summary: wants appt for tomorrow  Phone Note Call from Patient Call back at Home Phone [phone]   Caller: Spouse- Ann Call For: [name] MD Summary of Call: Wife says that patient has been feeling sick for the past week. She says that he's very tired and can't walk but just a little bit without getting tired and having to sit down. She says that he has not been eating all week. He is doing crazy things. He has not had any vomitting, but did notice him sorta spitting up at the first of the week. Wife has an appt with you tomorrow at 9:30 and she wants to know if you could see him when she comes in. Could I put him on the schedule for the 10:00. Please advise.  Initial call taken by: Melody Comas,  February 24, 2010 9:07 AM  Follow-up for Phone Call        since he is a complex pt - and does sound sick- I would prefer to give him her 30 min appt and re sched her asap for any 30 min availible - see if that is ok with them please Follow-up by: [name] MD,  February 24, 2010 9:19 AM  Additional Follow-up for Phone Call Additional follow up Details #1::        Wife rescheduled appt. Husband coming in her place.  Additional Follow-up by: Melody Comas,  February 24, 2010 9:45 AM

## 2010-12-15 NOTE — Progress Notes (Signed)
Summary: wants to get B12 here  Phone Note Call from Patient   Caller: Aram Beecham at [facility]    [phone] Call For: [name] MD Summary of Call: Pt is currently going to Dr Santo Held office for B12 injections but that office is closing and the pt doesnt want to travel to [place].  He would like to start getting these injections here.  Please advise if ok. Initial call taken by: Lowella Petties CMA, AAMA,  September 28, 2010 11:29 AM  Follow-up for Phone Call        it should not be a problem-- just schedule nurse visit when it is due  sounds like he gets them monthly Follow-up by: [name] MD,  September 28, 2010 11:35 AM  Additional Follow-up for Phone Call Additional follow up Details #1::        cancer center advised and they will advise patient of instruction.Consuello Masse CMA   Additional Follow-up by: Benny Lennert CMA Duncan Dull),  September 28, 2010 11:59 AM

## 2010-12-15 NOTE — Letter (Signed)
Summary: [facility]  [facility]   Imported By: Lanelle Bal 09/16/2010 09:32:43  _____________________________________________________________________  External Attachment:    Type:   Image     Comment:   External Document

## 2010-12-15 NOTE — Assessment & Plan Note (Signed)
Summary: 30 min acute/ per dr. Milinda Antis   Vital Signs:  Patient profile:   77 year old male Height:      71 inches Weight:      259.25 pounds BMI:     36.29 O2 Sat:      94 % on 2 L/min Temp:     97.5 degrees F oral Pulse rate:   80 / minute Pulse rhythm:   regular Resp:     18 per minute BP sitting:   114 / 64  (left arm) Cuff size:   large  Vitals Entered By: Lewanda Rife LPN (February 25, 2010 9:43 AM)  O2 Flow:  2 L/min CC: Very tired, walk short distances and not eating  all week   History of Present Illness: here because he is not feeling well is very tired -- esp on exertion -- gets tired (not really sob)   no appetite-- just feeling bad for 4-5 days  that is unusual for him  ? if a little confusion -- was imagining things -- and that is better (this was brief)  this started end of last week -- started having some chest pains (thinks it was his angina)  took his nitroglycerin and aspirin as instructed by cardiol in past  eased off fairly quickly  that has not come back yet -- (this happens occasionally ) -- does not tend to call cardiologist unless it does not resolve with nitro   one small cough spell with pollen - some drippy nose - not too bad breathing has been pretty good   one day had frequency of urination - better the next day  no burnig/ blood or back pain   just saw Dr Eden Emms last month- has been doing very well with his medically controlled coronary artery disease   Allergies: 1)  Penicillin 2)  Cardura  Past History:  Past Medical History: Last updated: 11/11/2009 CARDIOMYOPATHY (ICD-425.4) HYPERTENSION (ICD-401.9) CORONARY ARTERY DISEASE (ICD-414.00) MI (ICD-410.90) HYPERLIPIDEMIA (ICD-272.4) VITAMIN B12 DEFICIENCY (ICD-266.2) PNEUMONIA, LEFT (ICD-486) PNEUMONIA ORGANISM NOS (ICD-486) THROMBOCYTOPENIA (ICD-287.5) COR PULMONALE (ICD-416.9) HEARING LOSS (ICD-389.9) Hx of ALCOHOL ABUSE (ICD-305.00) Hx of TOBACCO ABUSE (ICD-305.1) RENAL  INSUFFICIENCY (ICD-588.9) OSTEOARTHRITIS (ICD-715.90) GOUT (ICD-274.9) DIVERTICULOSIS, COLON (ICD-562.10) COPD (ICD-496) ANEMIA-NOS (ICD-285.9)(follwed by heme) PURE HYPERCHOLESTEROLEMIA (ICD-272.0)  BRONCHIECTASIS    -  CT Chest 07/07/09 mild  bilateral lower lobe medial bronchiectasis Coronary artery disease Diverticulosis, colon pulm- Wert (in hosp) cardiol- Nishan  heme- Dr Park Breed  Past Surgical History: Last updated: 07/29/2009 PTCA/stent (1997) Cardiac catheterization and stent implantation.  Coronary artery disease, status post prior stenting of the mid right coronary artery in 1997 with 30% left main stenosis, 70-80% stenosis in the proximal left anterior descending with 80% stenosis in the second large diagonal branch and total occlusion of the mid left anterior descending,50% stenosis in the distal circumflex artery, a 95% stenosis in the proximal right coronary artery with less than 20% stenosis at the stent site in the mid right coronary artery and 70% stenosis in the distal right coronary artery with apical wall akinesis. Successful placement of tandem overlying stent in the proximal right coronary artery with improvement in percent diameter narrowing from 95% to 0%. Hosp- CHF (06/1999) Echo- EF 45%- dilated RV MRILS- disk disease (09/1999) Hosp- diverticul- bleed (10/2000) EGD- gastritis (12/1999) Colonoscopy- diverticulosis (12/1999) Cardiac cath- 4 vessel disease- stent PTCA (12/2000) Cardiolite- no change, LVH (01/2002) Olecranon bursitis- gout, not operable (10/2004) Admit- EGD, candidal esophagitis (03/2005) cardiac cath 8/10 - with total  LAD occlusion/ other partial occlusions/patent stent   Family History: Last updated: 11/11/2009 Lymphoma- Brother  Social History: Last updated: 11/11/2009  The patient lives in [place] with his wife.  He is   retired.  He has no significant tobacco, EtOH, or illicit drug use   history.  He takes no herbal medications, has a  regular diet, and does   not regularly exercise secondary to his comorbidities.  He is able to   get round a little bit, but cannot walk long distances.  Former smoker.  Quit in 1998.  Smoked for approx 48 yrs 1 ppd  Risk Factors: Smoking Status: quit (10/21/2007)  Review of Systems General:  Complains of fatigue and loss of appetite; denies chills, fever, and malaise. Eyes:  Denies blurring, discharge, and eye irritation. CV:  Denies chest pain or discomfort, lightheadness, palpitations, and swelling of feet. Resp:  Denies cough, shortness of breath, and wheezing. GI:  Complains of loss of appetite; denies abdominal pain, change in bowel habits, indigestion, nausea, and vomiting. GU:  Complains of urinary frequency; denies discharge, dysuria, and hematuria. MS:  Denies joint redness and joint swelling. Derm:  Denies itching, lesion(s), poor wound healing, and rash. Neuro:  Denies falling down, headaches, inability to speak, memory loss, numbness, tingling, and tremors. Psych:  Denies anxiety and depression; mood has been generally good . Endo:  Denies cold intolerance, excessive thirst, excessive urination, and heat intolerance. Heme:  Denies abnormal bruising and bleeding.  Physical Exam  General:  overweight but generally well appearing  Head:  normocephalic, atraumatic, and no abnormalities observed.  no sinus tenderness  Eyes:  vision grossly intact, pupils equal, pupils round, and pupils reactive to light.  no conjunctival pallor, injection or icterus  Ears:  hearing aides   Nose:  no nasal discharge.   Mouth:  pharynx pink and moist.   Neck:  .supple with full rom and no masses or thyromegally, no JVD or carotid bruit Chest Wall:  No deformities, masses, tenderness or gynecomastia noted. Lungs:  CTA with diffusely distant bs- harsh at bases no wheeze/rales/rhonchi not sob today   Heart:  Normal rate and regular rhythm. S1 and S2 normal without gallop, murmur, click, rub  or other extra sounds. Abdomen:  Bowel sounds positive,abdomen soft and non-tender without masses, organomegaly or hernias noted. no renal bruits  Msk:  No deformity or scoliosis noted of thoracic or lumbar spine.   Extremities:  trace edema - baseline pedal  Neurologic:  sensation intact to light touch, gait normal, and DTRs symmetrical and normal.   Skin:  Intact without suspicious lesions or rashes nl skin color and turgor with brisk cap refil time  Cervical Nodes:  No lymphadenopathy noted Psych:  nl affect hoh but mentally sharp and can give good hx    Impression & Recommendations:  Problem # 1:  FATIGUE (ICD-780.79) Assessment New new - ? multifactorial no acute change on ekg -- pt adv to call cardiol of any more anginal symptoms  pend urine cx labs todayOrders: Venipuncture (16109) TLB-Renal Function Panel (80069-RENAL) TLB-CBC Platelet - w/Differential (85025-CBCD) TLB-Hepatic/Liver Function Pnl (80076-HEPATIC) TLB-TSH (Thyroid Stimulating Hormone) (84443-TSH) TLB-B12 + Folate Pnl (60454_09811-B14/NWG) TLB-BNP (B-Natriuretic Peptide) (83880-BNPR) T-Culture, Urine (95621-30865) Specimen Handling (78469) UA Dipstick W/ Micro (manual) (81000) EKG w/ Interpretation (93000)  Problem # 2:  URGENCY OF URINATION (GEX-528.41) Assessment: New ua micro looks fairly clear in light of fatigue - cx sent Orders: T-Culture, Urine (32440-10272) Specimen Handling (53664) UA Dipstick W/ Micro (manual) (81000)  Problem # 3:  CARDIOMYOPATHY (ICD-425.4) Assessment: Unchanged bnp today with labs in light of fatigue on exertion Orders: Venipuncture (11914) TLB-Renal Function Panel (80069-RENAL) TLB-CBC Platelet - w/Differential (85025-CBCD) TLB-Hepatic/Liver Function Pnl (80076-HEPATIC) TLB-TSH (Thyroid Stimulating Hormone) (84443-TSH) TLB-B12 + Folate Pnl (78295_62130-Q65/HQI) TLB-BNP (B-Natriuretic Peptide) (83880-BNPR) EKG w/ Interpretation (93000)  Problem # 4:  CORONARY  ARTERY DISEASE (ICD-414.00) Assessment: Unchanged  EKG no acute changes from summer one episode of angina  will keep cardiologist updated  feeling fine today  His updated medication list for this problem includes:    Furosemide 80 Mg Tabs (Furosemide) ..... One by mouth once daily    Imdur 30 Mg Tb24 (Isosorbide mononitrate) .Marland Kitchen... 2 by mouth once daily    Toprol Xl 100 Mg Tb24 (Metoprolol succinate) ..... One by mouth once daily    Cozaar 50 Mg Tabs (Losartan potassium) ..... One by mouth two times a day    Plavix 75 Mg Tabs (Clopidogrel bisulfate) ..... One by mouth once daily    Aspirin 325 Mg Tabs (Aspirin) ..... One by mouth qd    Nitroglycerin 0.4 Mg Subl (Nitroglycerin) .Marland Kitchen... As needed chest pain  Labs Reviewed: Chol: 119 (02/02/2009)   HDL: 64.10 (02/02/2009)   LDL: 45 (02/02/2009)   TG: 51.0 (02/02/2009)  Orders: EKG w/ Interpretation (93000)  Problem # 5:  VITAMIN B12 DEFICIENCY (ICD-266.2) Assessment: Unchanged check B12 today in light of worsening fatigue  Orders: Venipuncture (69629) TLB-Renal Function Panel (80069-RENAL) TLB-CBC Platelet - w/Differential (85025-CBCD) TLB-Hepatic/Liver Function Pnl (80076-HEPATIC) TLB-TSH (Thyroid Stimulating Hormone) (84443-TSH) TLB-B12 + Folate Pnl (52841_32440-N02/VOZ) TLB-BNP (B-Natriuretic Peptide) (83880-BNPR)  Problem # 6:  RENAL INSUFFICIENCY (ICD-588.9) Assessment: Unchanged lab today- has been stable have to remind about fluid intake Orders: Venipuncture (36644) TLB-Renal Function Panel (80069-RENAL) TLB-CBC Platelet - w/Differential (85025-CBCD) TLB-Hepatic/Liver Function Pnl (80076-HEPATIC) TLB-TSH (Thyroid Stimulating Hormone) (84443-TSH) TLB-B12 + Folate Pnl (03474_25956-L87/FIE) TLB-BNP (B-Natriuretic Peptide) (83880-BNPR)  Problem # 7:  ANEMIA-NOS (ICD-285.9) Assessment: Comment Only check cbc today in light of fatigue  Orders: Venipuncture (33295) TLB-Renal Function Panel (80069-RENAL) TLB-CBC Platelet  - w/Differential (85025-CBCD) TLB-Hepatic/Liver Function Pnl (80076-HEPATIC) TLB-TSH (Thyroid Stimulating Hormone) (84443-TSH) TLB-B12 + Folate Pnl (18841_66063-K16/WFU) TLB-BNP (B-Natriuretic Peptide) (83880-BNPR)  Complete Medication List: 1)  Spiriva Handihaler 18 Mcg Caps (Tiotropium bromide monohydrate) .... Inhale 1 capsule by mouth once a day 2)  Furosemide 80 Mg Tabs (Furosemide) .... One by mouth once daily 3)  Imdur 30 Mg Tb24 (Isosorbide mononitrate) .... 2 by mouth once daily 4)  Toprol Xl 100 Mg Tb24 (Metoprolol succinate) .... One by mouth once daily 5)  Cozaar 50 Mg Tabs (Losartan potassium) .... One by mouth two times a day 6)  Plavix 75 Mg Tabs (Clopidogrel bisulfate) .... One by mouth once daily 7)  Zocor 20 Mg Tabs (Simvastatin) .... One by mouth once daily 8)  Aspirin 325 Mg Tabs (Aspirin) .... One by mouth qd 9)  Allopurinol 300 Mg Tabs (Allopurinol) .... One by mouth qd 10)  Oxygen  .... 2 litres q 24 hours 11)  Nitroglycerin 0.4 Mg Subl (Nitroglycerin) .... As needed chest pain 12)  B12 Shot  .... Once monthly  Patient Instructions: 1)  doing labs today and urine culture 2)  let me know if worse or seek care inER  if worse after hours  Prescriptions: PLAVIX 75 MG  TABS (CLOPIDOGREL BISULFATE) one by mouth once daily  #90 x 3   Entered and Authorized by:   [name] MD   Signed by:   Colon Flattery  Jamille Fisher MD on 02/25/2010   Method used:   Print then Give to Patient   RxID:   [id number] SPIRIVA HANDIHALER 18 MCG CAPS (TIOTROPIUM BROMIDE MONOHYDRATE) Inhale 1 capsule by mouth once a day  #3 months x 3   Entered and Authorized by:   [name] MD   Signed by:   [name] MD on 02/25/2010   Method used:   Print then Give to Patient   RxID:   [id number]   Current Allergies (reviewed today): PENICILLIN CARDURA   Laboratory Results   Urine Tests  Date/Time Received: February 25, 2010 10:24 AM  Date/Time Reported: February 25, 2010 10:24 AM    Routine Urinalysis   Color: yellow Appearance: Clear Glucose: negative   (Normal Range: Negative) Bilirubin: negative   (Normal Range: Negative) Ketone: negative   (Normal Range: Negative) Spec. Gravity: 1.010   (Normal Range: 1.003-1.035) Blood: small   (Normal Range: Negative) pH: 6.0   (Normal Range: 5.0-8.0) Protein: trace   (Normal Range: Negative) Urobilinogen: 0.2   (Normal Range: 0-1) Nitrite: negative   (Normal Range: Negative) Leukocyte Esterace: negative   (Normal Range: Negative)  Urine Microscopic WBC/HPF: 1 RBC/HPF: 0-1 Bacteria/HPF: few Mucous/HPF: few Epithelial/HPF: 2-3 Crystals/HPF: few Casts/LPF: 0 Yeast/HPF: 0 Other: 0        Laboratory Results   Urine Tests    Routine Urinalysis   Color: yellow Appearance: Clear Glucose: negative   (Normal Range: Negative) Bilirubin: negative   (Normal Range: Negative) Ketone: negative   (Normal Range: Negative) Spec. Gravity: 1.010   (Normal Range: 1.003-1.035) Blood: small   (Normal Range: Negative) pH: 6.0   (Normal Range: 5.0-8.0) Protein: trace   (Normal Range: Negative) Urobilinogen: 0.2   (Normal Range: 0-1) Nitrite: negative   (Normal Range: Negative) Leukocyte Esterace: negative   (Normal Range: Negative)  Urine Microscopic WBC/hpf: 1 RBC/hpf: 0-1 Bacteria: few Mucous: few Epithelial: 2-3 Crystals/LPF: few Casts/LPF: 0 Yeast/HPF: 0 Other: 0         EKG  Procedure date:  02/25/2010  Findings:      nsr with rate of 79  changes of prev infarct are stable from last EKG

## 2010-12-15 NOTE — Progress Notes (Signed)
Summary: pt is to continue B12 injections  Phone Note From Other Clinic   Caller: Amy with Dr Santo Held office Summary of Call: Pt was seen yesterday at the cancer center and they want him to continue to get monthly B12 injections for the next year.  Just an FYI. Initial call taken by: Lowella Petties CMA, AAMA,  November 30, 2010 12:39 PM  Follow-up for Phone Call        thanks for the heads up  schedule next shot when he needs it  I think he gets one monthly Follow-up by: [name] MD,  November 30, 2010 1:48 PM  Additional Follow-up for Phone Call Additional follow up Details #1::        Per patient's wife-B12 yesterday at [facility] center. Scheduled here for February and March.  Additional Follow-up by: Janee Morn CMA (AAMA),  November 30, 2010 3:12 PM

## 2010-12-15 NOTE — Letter (Signed)
Summary: Stacey Drain MD Rheumatology  Stacey Drain MD Rheumatology   Imported By: Lanelle Bal 08/05/2010 09:28:42  _____________________________________________________________________  External Attachment:    Type:   Image     Comment:   External Document

## 2010-12-15 NOTE — Assessment & Plan Note (Signed)
Summary: F3M/DM   Referring Provider:  Dr. Eden Emms Primary Provider:  Dr. Milinda Antis  CC:  check up.  History of Present Illness: Bill Walker is seen today post hospital D/C.  He was cathed by Dr Riley Kill and had 3VD.  I reviewed his cath films and agree that his lesions are not amenable to PCI and he is not a CABG candidate due to his age and advanced oxygen dependant COPD.  Since D/C he has not had SSCP  His activity is limited by dypnea.  The heat in particular bothers him.  He has been compliant with his meds and had is sl nitro with him today. He has had some increasing LE edema.  His wife has taken the salt shaker away.  He has not needed any nitro.      Current Problems (verified): 1)  Urgency of Urination  (ICD-788.63) 2)  Fatigue  (ICD-780.79) 3)  Cardiomyopathy  (ICD-425.4) 4)  Hypertension  (ICD-401.9) 5)  Coronary Artery Disease  (ICD-414.00) 6)  Mi  (ICD-410.90) 7)  Hyperlipidemia  (ICD-272.4) 8)  Vitamin B12 Deficiency  (ICD-266.2) 9)  Thrombocytopenia  (ICD-287.5) 10)  Cor Pulmonale  (ICD-416.9) 11)  Hearing Loss  (ICD-389.9) 12)  Hx of Alcohol Abuse  (ICD-305.00) 13)  Hx of Tobacco Abuse  (ICD-305.1) 14)  Renal Insufficiency  (ICD-588.9) 15)  Osteoarthritis  (ICD-715.90) 16)  Gout  (ICD-274.9) 17)  Diverticulosis, Colon  (ICD-562.10) 18)  COPD  (ICD-496) 19)  Anemia-nos  (ICD-285.9) 20)  Pure Hypercholesterolemia  (ICD-272.0)  Current Medications (verified): 1)  Spiriva Handihaler 18 Mcg Caps (Tiotropium Bromide Monohydrate) .... Inhale 1 Capsule By Mouth Once A Day 2)  Furosemide 80 Mg  Tabs (Furosemide) .... One By Mouth Once Daily 3)  Imdur 30 Mg  Tb24 (Isosorbide Mononitrate) .... 2 By Mouth Once Daily 4)  Toprol Xl 100 Mg  Tb24 (Metoprolol Succinate) .... One By Mouth Once Daily 5)  Cozaar 50 Mg  Tabs (Losartan Potassium) .... One By Mouth Two Times A Day 6)  Plavix 75 Mg  Tabs (Clopidogrel Bisulfate) .... One By Mouth Once Daily 7)  Zocor 20 Mg  Tabs (Simvastatin)  .... One By Mouth Once Daily 8)  Aspirin 325 Mg  Tabs (Aspirin) .... One By Mouth Qd 9)  Allopurinol 300 Mg  Tabs (Allopurinol) .... One By Mouth Qd 10)  Oxygen .... 2 Litres Q 24 Hours 11)  Nitroglycerin 0.4 Mg Subl (Nitroglycerin) .... As Needed Chest Pain 12)  B12 Shot .... Once Monthly  Allergies (verified): 1)  Penicillin 2)  Cardura  Past History:  Past Medical History: Last updated: 11/11/2009 CARDIOMYOPATHY (ICD-425.4) HYPERTENSION (ICD-401.9) CORONARY ARTERY DISEASE (ICD-414.00) MI (ICD-410.90) HYPERLIPIDEMIA (ICD-272.4) VITAMIN B12 DEFICIENCY (ICD-266.2) PNEUMONIA, LEFT (ICD-486) PNEUMONIA ORGANISM NOS (ICD-486) THROMBOCYTOPENIA (ICD-287.5) COR PULMONALE (ICD-416.9) HEARING LOSS (ICD-389.9) Hx of ALCOHOL ABUSE (ICD-305.00) Hx of TOBACCO ABUSE (ICD-305.1) RENAL INSUFFICIENCY (ICD-588.9) OSTEOARTHRITIS (ICD-715.90) GOUT (ICD-274.9) DIVERTICULOSIS, COLON (ICD-562.10) COPD (ICD-496) ANEMIA-NOS (ICD-285.9)(follwed by heme) PURE HYPERCHOLESTEROLEMIA (ICD-272.0)  BRONCHIECTASIS    -  CT Chest 07/07/09 mild  bilateral lower lobe medial bronchiectasis Coronary artery disease Diverticulosis, colon pulm- Wert (in hosp) cardiol- Nishan  heme- Dr Park Breed  Past Surgical History: Last updated: 07/29/2009 PTCA/stent (1997) Cardiac catheterization and stent implantation.  Coronary artery disease, status post prior stenting of the mid right coronary artery in 1997 with 30% left main stenosis, 70-80% stenosis in the proximal left anterior descending with 80% stenosis in the second large diagonal branch and total occlusion of the mid left anterior descending,50%  stenosis in the distal circumflex artery, a 95% stenosis in the proximal right coronary artery with less than 20% stenosis at the stent site in the mid right coronary artery and 70% stenosis in the distal right coronary artery with apical wall akinesis. Successful placement of tandem overlying stent in the proximal right  coronary artery with improvement in percent diameter narrowing from 95% to 0%. Hosp- CHF (06/1999) Echo- EF 45%- dilated RV MRILS- disk disease (09/1999) Hosp- diverticul- bleed (10/2000) EGD- gastritis (12/1999) Colonoscopy- diverticulosis (12/1999) Cardiac cath- 4 vessel disease- stent PTCA (12/2000) Cardiolite- no change, LVH (01/2002) Olecranon bursitis- gout, not operable (10/2004) Admit- EGD, candidal esophagitis (03/2005) cardiac cath 8/10 - with total LAD occlusion/ other partial occlusions/patent stent   Family History: Last updated: 11/11/2009 Lymphoma- Brother  Social History: Last updated: 11/11/2009  The patient lives in [place] with his wife.  He is   retired.  He has no significant tobacco, EtOH, or illicit drug use   history.  He takes no herbal medications, has a regular diet, and does   not regularly exercise secondary to his comorbidities.  He is able to   get round a little bit, but cannot walk long distances.  Former smoker.  Quit in 1998.  Smoked for approx 48 yrs 1 ppd  Review of Systems       Denies fever, malais, weight loss, blurry vision, decreased visual acuity, cough, sputum, SOB, hemoptysis, pleuritic pain, palpitaitons, heartburn, abdominal pain, melena,  claudication, or rash.   Vital Signs:  Patient profile:   77 year old male Height:      71 inches Weight:      266 pounds BMI:     37.23 Pulse rate:   69 / minute Resp:     18 per minute BP sitting:   121 / 56  (left arm)  Vitals Entered By: Kem Parkinson (July 11, 2010 9:04 AM)  Physical Exam  General:  Affect appropriate Healthy:  appears stated age HEENT: normal Neck supple with no adenopathy JVP normal no bruits no thyromegaly Lungs clear with no wheezing and good diaphragmatic motion Heart:  S1/S2 no murmur,rub, gallop or click PMI normal Abdomen: benighn, BS positve, no tenderness, no AAA no bruit.  No HSM or HJR Distal pulses intact with no bruits Plus 2 bilateral    Neuro non-focal Skin warm and dry    Impression & Recommendations:  Problem # 1:  CORONARY ARTERY DISEASE (ICD-414.00) 3VD not amenable to CABG or PCI.  Continue medical Rx His updated medication list for this problem includes:    Imdur 30 Mg Tb24 (Isosorbide mononitrate) .Marland Kitchen... 2 by mouth once daily    Toprol Xl 100 Mg Tb24 (Metoprolol succinate) ..... One by mouth once daily    Plavix 75 Mg Tabs (Clopidogrel bisulfate) ..... One by mouth once daily    Aspirin 325 Mg Tabs (Aspirin) ..... One by mouth qd    Nitroglycerin 0.4 Mg Subl (Nitroglycerin) .Marland Kitchen... As needed chest pain  Problem # 2:  HYPERTENSION (ICD-401.9)  Well controlled His updated medication list for this problem includes:    Furosemide 80 Mg Tabs (Furosemide) ..... One by mouth once daily and 1/2 tablet in the evening on mon, wed, fri    Toprol Xl 100 Mg Tb24 (Metoprolol succinate) ..... One by mouth once daily    Cozaar 50 Mg Tabs (Losartan potassium) ..... One by mouth two times a day    Aspirin 325 Mg Tabs (Aspirin) ..... One by mouth qd  Orders: TLB-BMP (Basic Metabolic Panel-BMET) (80048-METABOL)  Problem # 3:  HYPERLIPIDEMIA (ICD-272.4) At goal with no side effects His updated medication list for this problem includes:    Zocor 20 Mg Tabs (Simvastatin) ..... One by mouth once daily  CHOL: 119 (02/02/2009)   LDL: 45 (02/02/2009)   HDL: 64.10 (02/02/2009)   TG: 51.0 (02/02/2009)  Problem # 4:  COPD (ICD-496) Stable has portable oxygen.  f/U pulmonary His updated medication list for this problem includes:    Spiriva Handihaler 18 Mcg Caps (Tiotropium bromide monohydrate) ..... Inhale 1 capsule by mouth once a day  Problem # 5:  EDEMA (ICD-782.3) Increase Lasix 80/40 M,W,F BMET today.  Continue low sodium diet  Patient Instructions: 1)  Your physician recommends that you schedule a follow-up appointment in: 6 MONTHS 2)  Your physician has recommended you make the following change in your medication:  INCREASE FUROSEMIDE TO 80MG  IN THE MORNING AND 40MG  IN THE EVENING ON MONDAY, [facility] AND FRIDAY Prescriptions: FUROSEMIDE 80 MG  TABS (FUROSEMIDE) one by mouth once daily and 1/2 tablet in the evening on mon, wed, fri  #135 x 4   Entered by:   Deliah Goody, RN   Authorized by:   Colon Branch, MD, [facility]   Signed by:   Deliah Goody, RN on 07/11/2010   Method used:   Electronically to        [facility]* (mail-order)       [address] [place], [place]  [address]       Ph: [phone]       Fax: [phone]   RxID:   [id number] TOPROL XL 100 MG  TB24 (METOPROLOL SUCCINATE) one by mouth once daily  #90 x 3   Entered by:   Deliah Goody, RN   Authorized by:   Colon Branch, MD, [facility]   Signed by:   Deliah Goody, RN on 07/11/2010   Method used:   Electronically to        [facility]* (mail-order)       [address] [place], [place]  [address]       Ph: [phone]       Fax: [phone]   RxID:   [id number] TOPROL XL 100 MG  TB24 (METOPROLOL SUCCINATE) one by mouth once daily  #90 x 3   Entered by:   Kem Parkinson   Authorized by:   Colon Branch, MD, [facility]   Signed by:   Kem Parkinson on 07/11/2010   Method used:   Electronically to        [facility]* (mail-order)       [address] [place], [place]  [address]       Ph: [phone]       Fax: [phone]   RxID:   [id number] FUROSEMIDE 80 MG  TABS (FUROSEMIDE) one by mouth once daily  #90 x 3   Entered by:   Kem Parkinson   Authorized by:   Colon Branch, MD, [facility]   Signed by:   Kem Parkinson on 07/11/2010   Method used:   Electronically to        [facility]* (mail-order)       [address] [place], [place]  [address]       Ph: [phone]  Fax: [phone]   RxID:   [id number]

## 2010-12-15 NOTE — Assessment & Plan Note (Signed)
Summary: EPH   Visit Type:  Follow-up Referring Provider:  Dr. Eden Emms Primary Provider:  Dr. Milinda Antis  CC:  no   complaints.  History of Present Illness: Bill Walker is seen today post hospital D/C.  He was cathed by Dr Riley Kill and had 3VD.  I reviewed his cath films and agree that his lesions are not amenable to PCI and he is not a CABG candidate due to his age and advanced oxygen dependant COPD.  Since D/C he has not had SSCP and not taken any angina.  His activity is limited by dypnea.  The heat in particular bothers him.  He has been compliant with his meds and had is sl nitro with him today.  Current Problems (verified): 1)  Urgency of Urination  (ICD-788.63) 2)  Fatigue  (ICD-780.79) 3)  Cardiomyopathy  (ICD-425.4) 4)  Hypertension  (ICD-401.9) 5)  Coronary Artery Disease  (ICD-414.00) 6)  Mi  (ICD-410.90) 7)  Hyperlipidemia  (ICD-272.4) 8)  Vitamin B12 Deficiency  (ICD-266.2) 9)  Thrombocytopenia  (ICD-287.5) 10)  Cor Pulmonale  (ICD-416.9) 11)  Hearing Loss  (ICD-389.9) 12)  Hx of Alcohol Abuse  (ICD-305.00) 13)  Hx of Tobacco Abuse  (ICD-305.1) 14)  Renal Insufficiency  (ICD-588.9) 15)  Osteoarthritis  (ICD-715.90) 16)  Gout  (ICD-274.9) 17)  Diverticulosis, Colon  (ICD-562.10) 18)  COPD  (ICD-496) 19)  Anemia-nos  (ICD-285.9) 20)  Pure Hypercholesterolemia  (ICD-272.0)  Current Medications (verified): 1)  Spiriva Handihaler 18 Mcg Caps (Tiotropium Bromide Monohydrate) .... Inhale 1 Capsule By Mouth Once A Day 2)  Furosemide 80 Mg  Tabs (Furosemide) .... One By Mouth Once Daily 3)  Imdur 30 Mg  Tb24 (Isosorbide Mononitrate) .... 2 By Mouth Once Daily 4)  Toprol Xl 100 Mg  Tb24 (Metoprolol Succinate) .... One By Mouth Once Daily 5)  Cozaar 50 Mg  Tabs (Losartan Potassium) .... One By Mouth Two Times A Day 6)  Plavix 75 Mg  Tabs (Clopidogrel Bisulfate) .... One By Mouth Once Daily 7)  Zocor 20 Mg  Tabs (Simvastatin) .... One By Mouth Once Daily 8)  Aspirin 325 Mg  Tabs  (Aspirin) .... One By Mouth Qd 9)  Allopurinol 300 Mg  Tabs (Allopurinol) .... One By Mouth Qd 10)  Oxygen .... 2 Litres Q 24 Hours 11)  Nitroglycerin 0.4 Mg Subl (Nitroglycerin) .... As Needed Chest Pain 12)  B12 Shot .... Once Monthly  Allergies (verified): 1)  Penicillin 2)  Cardura  Past History:  Past Medical History: Last updated: 11/11/2009 CARDIOMYOPATHY (ICD-425.4) HYPERTENSION (ICD-401.9) CORONARY ARTERY DISEASE (ICD-414.00) MI (ICD-410.90) HYPERLIPIDEMIA (ICD-272.4) VITAMIN B12 DEFICIENCY (ICD-266.2) PNEUMONIA, LEFT (ICD-486) PNEUMONIA ORGANISM NOS (ICD-486) THROMBOCYTOPENIA (ICD-287.5) COR PULMONALE (ICD-416.9) HEARING LOSS (ICD-389.9) Hx of ALCOHOL ABUSE (ICD-305.00) Hx of TOBACCO ABUSE (ICD-305.1) RENAL INSUFFICIENCY (ICD-588.9) OSTEOARTHRITIS (ICD-715.90) GOUT (ICD-274.9) DIVERTICULOSIS, COLON (ICD-562.10) COPD (ICD-496) ANEMIA-NOS (ICD-285.9)(follwed by heme) PURE HYPERCHOLESTEROLEMIA (ICD-272.0)  BRONCHIECTASIS    -  CT Chest 07/07/09 mild  bilateral lower lobe medial bronchiectasis Coronary artery disease Diverticulosis, colon pulm- Wert (in hosp) cardiol- Eliel Dudding  heme- Dr Park Breed  Past Surgical History: Last updated: 07/29/2009 PTCA/stent (1997) Cardiac catheterization and stent implantation.  Coronary artery disease, status post prior stenting of the mid right coronary artery in 1997 with 30% left main stenosis, 70-80% stenosis in the proximal left anterior descending with 80% stenosis in the second large diagonal branch and total occlusion of the mid left anterior descending,50% stenosis in the distal circumflex artery, a 95% stenosis in the proximal right coronary artery with  less than 20% stenosis at the stent site in the mid right coronary artery and 70% stenosis in the distal right coronary artery with apical wall akinesis. Successful placement of tandem overlying stent in the proximal right coronary artery with improvement in percent diameter  narrowing from 95% to 0%. Hosp- CHF (06/1999) Echo- EF 45%- dilated RV MRILS- disk disease (09/1999) Hosp- diverticul- bleed (10/2000) EGD- gastritis (12/1999) Colonoscopy- diverticulosis (12/1999) Cardiac cath- 4 vessel disease- stent PTCA (12/2000) Cardiolite- no change, LVH (01/2002) Olecranon bursitis- gout, not operable (10/2004) Admit- EGD, candidal esophagitis (03/2005) cardiac cath 8/10 - with total LAD occlusion/ other partial occlusions/patent stent   Family History: Last updated: 11/11/2009 Lymphoma- Brother  Social History: Last updated: 11/11/2009  The patient lives in [place] with his wife.  He is   retired.  He has no significant tobacco, EtOH, or illicit drug use   history.  He takes no herbal medications, has a regular diet, and does   not regularly exercise secondary to his comorbidities.  He is able to   get round a little bit, but cannot walk long distances.  Former smoker.  Quit in 1998.  Smoked for approx 48 yrs 1 ppd  Review of Systems       Denies fever, malais, weight loss, blurry vision, decreased visual acuity, cough, sputum, , hemoptysis, pleuritic pain, palpitaitons, heartburn, abdominal pain, melena, lower extremity edema, claudication, or rash.   Vital Signs:  Patient profile:   77 year old male Height:      71 inches Weight:      255 pounds Pulse rate:   74 / minute BP sitting:   134 / 69  (left arm) Cuff size:   large  Vitals Entered By: Burnett Kanaris, CNA (Apr 12, 2010 11:11 AM)  Physical Exam  General:  Affect appropriate Healthy:  appears stated age HEENT: normal Neck supple with no adenopathy JVP elevated no bruits no thyromegaly Lungs clear with no wheezing and good diaphragmatic motion Heart:  S1/S2 no murmur,rub, gallop or click PMI normal Abdomen: benighn, BS positve, no tenderness, no AAA no bruit.  No HSM or HJR Distal pulses intact with no bruits Plus one bilateral edema Neuro non-focal Skin warm and  dry    Impression & Recommendations:  Problem # 1:  CORONARY ARTERY DISEASE (ICD-414.00) 3VD recent cath.  Angina relieved by medical Rx.  Consdier Ranexa in future if symptoms worsen His updated medication list for this problem includes:    Imdur 30 Mg Tb24 (Isosorbide mononitrate) .Marland Kitchen... 2 by mouth once daily    Toprol Xl 100 Mg Tb24 (Metoprolol succinate) ..... One by mouth once daily    Plavix 75 Mg Tabs (Clopidogrel bisulfate) ..... One by mouth once daily    Aspirin 325 Mg Tabs (Aspirin) ..... One by mouth qd    Nitroglycerin 0.4 Mg Subl (Nitroglycerin) .Marland Kitchen... As needed chest pain  Problem # 2:  HYPERTENSION (ICD-401.9) Well controlled His updated medication list for this problem includes:    Furosemide 80 Mg Tabs (Furosemide) ..... One by mouth once daily    Toprol Xl 100 Mg Tb24 (Metoprolol succinate) ..... One by mouth once daily    Cozaar 50 Mg Tabs (Losartan potassium) ..... One by mouth two times a day    Aspirin 325 Mg Tabs (Aspirin) ..... One by mouth qd  Problem # 3:  HYPERLIPIDEMIA (ICD-272.4) At goal with no side effects His updated medication list for this problem includes:    Zocor 20 Mg Tabs (Simvastatin) .Marland KitchenMarland KitchenMarland KitchenMarland Kitchen  One by mouth once daily  CHOL: 119 (02/02/2009)   LDL: 45 (02/02/2009)   HDL: 64.10 (02/02/2009)   TG: 51.0 (02/02/2009)  Problem # 4:  COR PULMONALE (ICD-416.9) On oxygen.  Continue diurectic for right sided symptoms and edema.  F/U pulmonary  Patient Instructions: 1)  Your physician recommends that you schedule a follow-up appointment in: 3 MONTHS Prescriptions: IMDUR 30 MG  TB24 (ISOSORBIDE MONONITRATE) 2 by mouth once daily  #180 x 3   Entered by:   Burnett Kanaris, CNA   Authorized by:   Colon Branch, MD, [facility]   Signed by:   Burnett Kanaris, CNA on 04/12/2010   Method used:   Electronically to        [facility]* (mail-order)       [address] [place], [place]  [address]       Ph: [phone]       Fax: [phone]    RxID:   [id number]

## 2010-12-15 NOTE — Letter (Signed)
Summary: Dr.Jay Patel,[facility],Note  Dr.Jay Patel,[facility],Note   Imported By: Beau Fanny 06/24/2010 08:51:21  _____________________________________________________________________  External Attachment:    Type:   Image     Comment:   External Document

## 2010-12-15 NOTE — Progress Notes (Signed)
Summary: rx vreifacation  Phone Note From Pharmacy   Caller: [facility]* Summary of Call: pharmacy rec two faxes for furosemide with different directions-pls verfiy directions [phone] ref #[id number] Initial call taken by: Glynda Jaeger,  July 13, 2010 12:36 PM  Follow-up for Phone Call        talked to pharmacist gave pt 3 refills...furoemide was increased Follow-up by: Kem Parkinson,  July 13, 2010 12:55 PM

## 2010-12-15 NOTE — Progress Notes (Signed)
Summary: refill request for zocor  Phone Note Refill Request Message from:  Fax from Pharmacy  Refills Requested: Medication #1:  ZOCOR 20 MG  TABS one by mouth once daily F[facility]ed form from [facility] is on your shelf.  Initial call taken by: Lowella Petties CMA,  January 24, 2010 8:28 AM  Follow-up for Phone Call        he is actually due for lab this month  sched fasting lab when able  lipid/ast/alt/renal / cbc with diff/ tsh 401.1, 272,   form done and in nurse in box  Follow-up by: [name] MD,  January 24, 2010 11:48 AM  Additional Follow-up for Phone Call Additional follow up Details #1::        completed form faxed to [phone].Lewanda Rife LPN  January 24, 2010 12:12 PM     New/Updated Medications: ZOCOR 20 MG  TABS (SIMVASTATIN) one by mouth once daily Prescriptions: ZOCOR 20 MG  TABS (SIMVASTATIN) one by mouth once daily  #90 x 3   Entered and Authorized by:   [name] MD   Signed by:   [name] MD on 01/24/2010   Method used:   Historical   RxID:   [id number]

## 2010-12-15 NOTE — Progress Notes (Signed)
Summary: Referral Update  Phone Note From Other Clinic   Caller: Referral Coordinator Summary of Call: Referral was sent to [facility]. Pt seen there years ago. [facility] called back to let us know that Pts may not get an appt until July 2011 or longer, after doctor reviewed the records.Marland KitchenMarland KitchenDaine Gip  Mar 14, 2010 12:07 PM  Initial call taken by: Daine Gip,  Mar 14, 2010 12:07 PM  Follow-up for Phone Call        that is ok with me for summer if they reviewed the records  Follow-up by: [name] MD,  Mar 14, 2010 1:21 PM

## 2010-12-15 NOTE — Progress Notes (Signed)
Summary: Appointment to [facility]  Phone Note Call from Patient Call back at Home Phone [phone]   Caller: Spouse Call For: Bill Part MD Summary of Call: Ms. Curl is responding to your question:  If [facility] has  contacted him directly about an appointment.  They have contacted them  and it is taken care of. Initial call taken by: Delilah Shan CMA Duncan Dull),  Apr 05, 2010 11:38 AM

## 2010-12-15 NOTE — Assessment & Plan Note (Signed)
Summary: rov/edema in lower/jml   Referring Provider:  Dr. Eden Emms Primary Provider:  Dr. Milinda Antis  CC:  ROV; Fatigue; occasional ankle edema.  History of Present Illness: Bill Walker is seen today for F/U of dyspnea, CAD and DCM  He has had some pneumonia this winter and is on chronic home oxygen.   His last  heart cath that showed an occluded LAD that was collaterlized  He has an old IMI with PCI in 2002 and and EF of 41%.  His myovue 07/29/09 was reviewed and non-ischemic with an old IMI.  He has seen Dr. Milinda Antis since D/C and needs a F/U appt with Dr. Sherene Sires as he has server COPD on home oxygen. .    Given the advanced stage of his COPD I told Bill Walker that I think we should Rx him medically unless his symptoms worsen.  He has had malaise and weakness for about 2 weeks.  He saw Dr Milinda Antis and lab work showed significant prerenal azotemia.  Apparantly his Lasix was not decreased.  We will chekc a BMET and BNP today.  Current Problems (verified): 1)  Urgency of Urination  (ICD-788.63) 2)  Fatigue  (ICD-780.79) 3)  Cardiomyopathy  (ICD-425.4) 4)  Hypertension  (ICD-401.9) 5)  Coronary Artery Disease  (ICD-414.00) 6)  Mi  (ICD-410.90) 7)  Hyperlipidemia  (ICD-272.4) 8)  Vitamin B12 Deficiency  (ICD-266.2) 9)  Thrombocytopenia  (ICD-287.5) 10)  Cor Pulmonale  (ICD-416.9) 11)  Hearing Loss  (ICD-389.9) 12)  Hx of Alcohol Abuse  (ICD-305.00) 13)  Hx of Tobacco Abuse  (ICD-305.1) 14)  Renal Insufficiency  (ICD-588.9) 15)  Osteoarthritis  (ICD-715.90) 16)  Gout  (ICD-274.9) 17)  Diverticulosis, Colon  (ICD-562.10) 18)  COPD  (ICD-496) 19)  Anemia-nos  (ICD-285.9) 20)  Pure Hypercholesterolemia  (ICD-272.0)  Current Medications (verified): 1)  Spiriva Handihaler 18 Mcg Caps (Tiotropium Bromide Monohydrate) .... Inhale 1 Capsule By Mouth Once A Day 2)  Furosemide 80 Mg  Tabs (Furosemide) .... One By Mouth Once Daily 3)  Imdur 30 Mg  Tb24 (Isosorbide Mononitrate) .... 2 By Mouth Once Daily 4)  Toprol  Xl 100 Mg  Tb24 (Metoprolol Succinate) .... One By Mouth Once Daily 5)  Cozaar 50 Mg  Tabs (Losartan Potassium) .... One By Mouth Two Times A Day 6)  Plavix 75 Mg  Tabs (Clopidogrel Bisulfate) .... One By Mouth Once Daily 7)  Zocor 20 Mg  Tabs (Simvastatin) .... One By Mouth Once Daily 8)  Aspirin 325 Mg  Tabs (Aspirin) .... One By Mouth Qd 9)  Allopurinol 300 Mg  Tabs (Allopurinol) .... One By Mouth Qd 10)  Oxygen .... 2 Litres Q 24 Hours 11)  Nitroglycerin 0.4 Mg Subl (Nitroglycerin) .... As Needed Chest Pain 12)  B12 Shot .... Once Monthly  Allergies: 1)  Penicillin 2)  Cardura  Past History:  Past Medical History: Last updated: 11/11/2009 CARDIOMYOPATHY (ICD-425.4) HYPERTENSION (ICD-401.9) CORONARY ARTERY DISEASE (ICD-414.00) MI (ICD-410.90) HYPERLIPIDEMIA (ICD-272.4) VITAMIN B12 DEFICIENCY (ICD-266.2) PNEUMONIA, LEFT (ICD-486) PNEUMONIA ORGANISM NOS (ICD-486) THROMBOCYTOPENIA (ICD-287.5) COR PULMONALE (ICD-416.9) HEARING LOSS (ICD-389.9) Hx of ALCOHOL ABUSE (ICD-305.00) Hx of TOBACCO ABUSE (ICD-305.1) RENAL INSUFFICIENCY (ICD-588.9) OSTEOARTHRITIS (ICD-715.90) GOUT (ICD-274.9) DIVERTICULOSIS, COLON (ICD-562.10) COPD (ICD-496) ANEMIA-NOS (ICD-285.9)(follwed by heme) PURE HYPERCHOLESTEROLEMIA (ICD-272.0)  BRONCHIECTASIS    -  CT Chest 07/07/09 mild  bilateral lower lobe medial bronchiectasis Coronary artery disease Diverticulosis, colon pulm- Wert (in hosp) cardiol- [facility]  heme- Dr Park Breed  Past Surgical History: Last updated: 07/29/2009 PTCA/stent (1997) Cardiac catheterization and stent implantation.  Coronary artery disease, status post prior stenting of the mid right coronary artery in 1997 with 30% left main stenosis, 70-80% stenosis in the proximal left anterior descending with 80% stenosis in the second large diagonal branch and total occlusion of the mid left anterior descending,50% stenosis in the distal circumflex artery, a 95% stenosis in the proximal right  coronary artery with less than 20% stenosis at the stent site in the mid right coronary artery and 70% stenosis in the distal right coronary artery with apical wall akinesis. Successful placement of tandem overlying stent in the proximal right coronary artery with improvement in percent diameter narrowing from 95% to 0%. Hosp- CHF (06/1999) Echo- EF 45%- dilated RV MRILS- disk disease (09/1999) Hosp- diverticul- bleed (10/2000) EGD- gastritis (12/1999) Colonoscopy- diverticulosis (12/1999) Cardiac cath- 4 vessel disease- stent PTCA (12/2000) Cardiolite- no change, LVH (01/2002) Olecranon bursitis- gout, not operable (10/2004) Admit- EGD, candidal esophagitis (03/2005) cardiac cath 8/10 - with total LAD occlusion/ other partial occlusions/patent stent   Family History: Last updated: 11/11/2009 Lymphoma- Brother  Social History: Last updated: 11/11/2009  The patient lives in [place] with his wife.  He is   retired.  He has no significant tobacco, EtOH, or illicit drug use   history.  He takes no herbal medications, has a regular diet, and does   not regularly exercise secondary to his comorbidities.  He is able to   get round a little bit, but cannot walk long distances.  Former smoker.  Quit in 1998.  Smoked for approx 48 yrs 1 ppd  Review of Systems       Denies fever, malais, weight loss, blurry vision, decreased visual acuity, cough, sputum, , hemoptysis, pleuritic pain, palpitaitons, heartburn, abdominal pain, melena, lower extremity edema, claudication, or rash.   Vital Signs:  Patient profile:   77 year old male Height:      71 inches Weight:      255 pounds BMI:     35.69 O2 Sat:      95 % on 2 L/min Pulse rate:   68 / minute BP sitting:   140 / 70  (left arm)  Vitals Entered By: Stanton Kidney, EMT-P (March 08, 2010 2:41 PM)  O2 Flow:  2 L/min  Physical Exam  General:  Affect appropriate Healthy:  appears stated age HEENT: normal Neck supple with no  adenopathy JVP normal no bruits no thyromegaly Lungs clear with no wheezing and good diaphragmatic motion Heart:  S1/S2 no murmur,rub, gallop or click PMI normal Abdomen: benighn, BS positve, no tenderness, no AAA no bruit.  No HSM or HJR Distal pulses intact with no bruits Plus one  edema Neuro non-focal Skin warm and dry    Impression & Recommendations:  Problem # 1:  CARDIOMYOPATHY (ICD-425.4) Stable check EF by echo.  BNP and BMET to see if prerenal azotemia persists and Lasix should be decreased His updated medication list for this problem includes:    Furosemide 80 Mg Tabs (Furosemide) ..... One by mouth once daily    Imdur 30 Mg Tb24 (Isosorbide mononitrate) .Marland Kitchen... 2 by mouth once daily    Toprol Xl 100 Mg Tb24 (Metoprolol succinate) ..... One by mouth once daily    Cozaar 50 Mg Tabs (Losartan potassium) ..... One by mouth two times a day    Plavix 75 Mg Tabs (Clopidogrel bisulfate) ..... One by mouth once daily    Aspirin 325 Mg Tabs (Aspirin) ..... One by mouth qd    Nitroglycerin 0.4 Mg  Subl (Nitroglycerin) .Marland Kitchen... As needed chest pain  Orders: TLB-BNP (B-Natriuretic Peptide) (83880-BNPR) Echocardiogram (Echo)  Problem # 2:  HYPERTENSION (ICD-401.9) Well controlled His updated medication list for this problem includes:    Furosemide 80 Mg Tabs (Furosemide) ..... One by mouth once daily    Toprol Xl 100 Mg Tb24 (Metoprolol succinate) ..... One by mouth once daily    Cozaar 50 Mg Tabs (Losartan potassium) ..... One by mouth two times a day    Aspirin 325 Mg Tabs (Aspirin) ..... One by mouth qd  Orders: TLB-BMP (Basic Metabolic Panel-BMET) (80048-METABOL)  Problem # 3:  CORONARY ARTERY DISEASE (ICD-414.00) Stable no angina Continue ASA and BB His updated medication list for this problem includes:    Imdur 30 Mg Tb24 (Isosorbide mononitrate) .Marland Kitchen... 2 by mouth once daily    Toprol Xl 100 Mg Tb24 (Metoprolol succinate) ..... One by mouth once daily    Plavix 75 Mg Tabs  (Clopidogrel bisulfate) ..... One by mouth once daily    Aspirin 325 Mg Tabs (Aspirin) ..... One by mouth qd    Nitroglycerin 0.4 Mg Subl (Nitroglycerin) .Marland Kitchen... As needed chest pain  Problem # 4:  HYPERLIPIDEMIA (ICD-272.4) At goal with no side effects His updated medication list for this problem includes:    Zocor 20 Mg Tabs (Simvastatin) ..... One by mouth once daily  CHOL: 119 (02/02/2009)   LDL: 45 (02/02/2009)   HDL: 64.10 (02/02/2009)   TG: 51.0 (02/02/2009)  Problem # 5:  COR PULMONALE (ICD-416.9) Home oxygen with chronic dyspnea.  F/ U pulmonary  Patient Instructions: 1)  Your physician recommends that you schedule a follow-up appointment in: 6 MONTHS 2)  Your physician has requested that you have an echocardiogram.  Echocardiography is a painless test that uses sound waves to create images of your heart. It provides your doctor with information about the size and shape of your heart and how well your heart's chambers and valves are working.  This procedure takes approximately one hour. There are no restrictions for this procedure.

## 2010-12-15 NOTE — Consult Note (Signed)
Summary: Dr.Jay Patel,[facility],Note  Dr.Jay Patel,[facility],Note   Imported By: Beau Fanny 05/10/2010 15:38:55  _____________________________________________________________________  External Attachment:    Type:   Image     Comment:   External Document

## 2010-12-15 NOTE — Assessment & Plan Note (Signed)
Summary: 6 month rov/sl   Referring Provider:  Dr. Eden Emms Primary Provider:  Dr. Milinda Antis  CC:  check up.  History of Present Illness: Bill Walker is seen today for F/U of dyspnea, CAD and DCM  He has had some pneumonia this winter and is on chronic home oxygen.   His last  heart cath that showed an occluded LAD that was collaterlized  He has an old IMI with PCI form 2002 and and EF of 41%.  His myovue 9/16 was reviewed and non-ischemic with an old IMI.  He has seen Dr. Milinda Antis since D/C and needs a F/U appt with Dr. Sherene Sires as he has server COPD on home oxygen. Marland Kitchen   He indicates having one episode of angina in the last 3 months and only took one nitro for this.  Given the advanced stage of his COPD I told Bill Walker that I think we should Rx him medically unless his symptoms worsen.  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  I told him the only way it could be removed safely is with local as he is not a candidate for general anesthesia  Current Problems (verified): 1)  Septic/hypovolemic Shock  (ICD-785.59) 2)  Cardiomyopathy  (ICD-425.4) 3)  Hypertension  (ICD-401.9) 4)  Coronary Artery Disease  (ICD-414.00) 5)  Mi  (ICD-410.90) 6)  Hyperlipidemia  (ICD-272.4) 7)  Vitamin B12 Deficiency  (ICD-266.2) 8)  Pneumonia, Left  (ICD-486) 9)  Pneumonia Organism Nos  (ICD-486) 10)  Thrombocytopenia  (ICD-287.5) 11)  Cor Pulmonale  (ICD-416.9) 12)  Hearing Loss  (ICD-389.9) 13)  Hx of Alcohol Abuse  (ICD-305.00) 14)  Hx of Tobacco Abuse  (ICD-305.1) 15)  Renal Insufficiency  (ICD-588.9) 16)  Osteoarthritis  (ICD-715.90) 17)  Gout  (ICD-274.9) 18)  Diverticulosis, Colon  (ICD-562.10) 19)  COPD  (ICD-496) 20)  Anemia-nos  (ICD-285.9) 21)  Pure Hypercholesterolemia  (ICD-272.0)  Current Medications (verified): 1)  Spiriva Handihaler 18 Mcg Caps (Tiotropium Bromide Monohydrate) .... Inhale 1 Capsule By Mouth Once A Day 2)  Furosemide 80 Mg  Tabs (Furosemide) .... One By Mouth  Once Daily 3)  Imdur 30 Mg  Tb24 (Isosorbide Mononitrate) .... 2 By Mouth Once Daily 4)  Toprol Xl 100 Mg  Tb24 (Metoprolol Succinate) .... One By Mouth Once Daily 5)  Cozaar 50 Mg  Tabs (Losartan Potassium) .... One By Mouth Two Times A Day 6)  Plavix 75 Mg  Tabs (Clopidogrel Bisulfate) .... One By Mouth Once Daily 7)  Zocor 20 Mg  Tabs (Simvastatin) .... One By Mouth Once Daily 8)  Aspirin 325 Mg  Tabs (Aspirin) .... One By Mouth Qd 9)  Allopurinol 300 Mg  Tabs (Allopurinol) .... One By Mouth Qd 10)  Oxygen .... 2 Litres Q 24 Hours 11)  Nitroglycerin 0.4 Mg Subl (Nitroglycerin) .... As Needed Chest Pain 12)  B12 Shot .... Once Monthly  Allergies (verified): 1)  Penicillin 2)  Cardura  Past History:  Past Medical History: Last updated: 11/11/2009 CARDIOMYOPATHY (ICD-425.4) HYPERTENSION (ICD-401.9) CORONARY ARTERY DISEASE (ICD-414.00) MI (ICD-410.90) HYPERLIPIDEMIA (ICD-272.4) VITAMIN B12 DEFICIENCY (ICD-266.2) PNEUMONIA, LEFT (ICD-486) PNEUMONIA ORGANISM NOS (ICD-486) THROMBOCYTOPENIA (ICD-287.5) COR PULMONALE (ICD-416.9) HEARING LOSS (ICD-389.9) Hx of ALCOHOL ABUSE (ICD-305.00) Hx of TOBACCO ABUSE (ICD-305.1) RENAL INSUFFICIENCY (ICD-588.9) OSTEOARTHRITIS (ICD-715.90) GOUT (ICD-274.9) DIVERTICULOSIS, COLON (ICD-562.10) COPD (ICD-496) ANEMIA-NOS (ICD-285.9)(follwed by heme) PURE HYPERCHOLESTEROLEMIA (ICD-272.0)  BRONCHIECTASIS    -  CT Chest 07/07/09 mild  bilateral lower lobe medial bronchiectasis Coronary artery disease Diverticulosis, colon pulm- Wert (in hosp)  cardiol- Abhi Moccia  heme- Dr Park Breed  Past Surgical History: Last updated: 07/29/2009 PTCA/stent (1997) Cardiac catheterization and stent implantation.  Coronary artery disease, status post prior stenting of the mid right coronary artery in 1997 with 30% left main stenosis, 70-80% stenosis in the proximal left anterior descending with 80% stenosis in the second large diagonal branch and total occlusion of the  mid left anterior descending,50% stenosis in the distal circumflex artery, a 95% stenosis in the proximal right coronary artery with less than 20% stenosis at the stent site in the mid right coronary artery and 70% stenosis in the distal right coronary artery with apical wall akinesis. Successful placement of tandem overlying stent in the proximal right coronary artery with improvement in percent diameter narrowing from 95% to 0%. Hosp- CHF (06/1999) Echo- EF 45%- dilated RV MRILS- disk disease (09/1999) Hosp- diverticul- bleed (10/2000) EGD- gastritis (12/1999) Colonoscopy- diverticulosis (12/1999) Cardiac cath- 4 vessel disease- stent PTCA (12/2000) Cardiolite- no change, LVH (01/2002) Olecranon bursitis- gout, not operable (10/2004) Admit- EGD, candidal esophagitis (03/2005) cardiac cath 8/10 - with total LAD occlusion/ other partial occlusions/patent stent   Family History: Last updated: 11/11/2009 Lymphoma- Brother  Social History: Last updated: 11/11/2009  The patient lives in [place] with his wife.  He is   retired.  He has no significant tobacco, EtOH, or illicit drug use   history.  He takes no herbal medications, has a regular diet, and does   not regularly exercise secondary to his comorbidities.  He is able to   get round a little bit, but cannot walk long distances.  Former smoker.  Quit in 1998.  Smoked for approx 48 yrs 1 ppd  Review of Systems       Denies fever, malais, weight loss, blurry vision, decreased visual acuity, cough, sputum, hemoptysis, pleuritic pain, palpitaitons, heartburn, abdominal pain, melena, lower extremity edema, claudication, or rash.   Vital Signs:  Patient profile:   77 year old male Height:      71 inches Weight:      260 pounds BMI:     36.39 Pulse rate:   63 / minute Resp:     18 per minute BP sitting:   129 / 71  (left arm)  Vitals Entered By: Bill Walker (January 19, 2010 10:01 AM)  Physical Exam  General:  Affect  appropriate Healthy:  appears stated age HEENT: normal Neck supple with no adenopathy JVP normal no bruits no thyromegaly Lungs clear with poor air movement but no active wheezing on oxygen Heart:  S1/S2 no murmur,rub, gallop or click PMI normal Abdomen: benighn, BS positve, no tenderness, no AAA no bruit.  No HSM or HJR Distal pulses intact with no bruits Plus one bilagteral edema Neuro non-focal Skin warm and dry echymosis on both arms Tophi on right elbow   Impression & Recommendations:  Problem # 1:  CARDIOMYOPATHY (ICD-425.4) Euvolemic with some LE edema from cor pulmonale  Continue current dose of Lasix His updated medication list for this problem includes:    Furosemide 80 Mg Tabs (Furosemide) ..... One by mouth once daily    Imdur 30 Mg Tb24 (Isosorbide mononitrate) .Marland Kitchen... 2 by mouth once daily    Toprol Xl 100 Mg Tb24 (Metoprolol succinate) ..... One by mouth once daily    Cozaar 50 Mg Tabs (Losartan potassium) ..... One by mouth two times a day    Plavix 75 Mg Tabs (Clopidogrel bisulfate) ..... One by mouth once daily    Aspirin 325  Mg Tabs (Aspirin) ..... One by mouth qd    Nitroglycerin 0.4 Mg Subl (Nitroglycerin) .Marland Kitchen... As needed chest pain  Problem # 2:  HYPERTENSION (ICD-401.9) Well controlled His updated medication list for this problem includes:    Furosemide 80 Mg Tabs (Furosemide) ..... One by mouth once daily    Toprol Xl 100 Mg Tb24 (Metoprolol succinate) ..... One by mouth once daily    Cozaar 50 Mg Tabs (Losartan potassium) ..... One by mouth two times a day    Aspirin 325 Mg Tabs (Aspirin) ..... One by mouth qd  Problem # 3:  CORONARY ARTERY DISEASE (ICD-414.00) Stable only one episode of angina.  Continue current meds.  Cath only for more advanced symptoms given other comorbidities His updated medication list for this problem includes:    Imdur 30 Mg Tb24 (Isosorbide mononitrate) .Marland Kitchen... 2 by mouth once daily    Toprol Xl 100 Mg Tb24 (Metoprolol  succinate) ..... One by mouth once daily    Plavix 75 Mg Tabs (Clopidogrel bisulfate) ..... One by mouth once daily    Aspirin 325 Mg Tabs (Aspirin) ..... One by mouth qd    Nitroglycerin 0.4 Mg Subl (Nitroglycerin) .Marland Kitchen... As needed chest pain  Patient Instructions: 1)  Your physician recommends that you schedule a follow-up appointment in: 6 MONTHS

## 2010-12-15 NOTE — Letter (Signed)
[facility]   Imported By: Lanelle Bal 12/27/2009 11:54:39  _____________________________________________________________________  External Attachment:    Type:   Image     Comment:   External Document

## 2010-12-15 NOTE — Assessment & Plan Note (Signed)
Summary: B-12 INJ/TOWER/CLE  Nurse Visit   Allergies: 1)  Penicillin 2)  Cardura  Medication Administration  Injection # 1:    Medication: Vit B12 1000 mcg    Diagnosis: VITAMIN B12 DEFICIENCY (ICD-266.2)    Route: IM    Site: R deltoid    Exp Date: 02/12/2012    Lot #: [id number]    Mfr: American Regent    Patient tolerated injection without complications    Given by: Mervin Hack CMA Duncan Dull) (October 27, 2010 8:35 AM)  Orders Added: 1)  Vit B12 1000 mcg [J3420] 2)  Admin of Therapeutic Inj  intramuscular or subcutaneous [96372]   Medication Administration  Injection # 1:    Medication: Vit B12 1000 mcg    Diagnosis: VITAMIN B12 DEFICIENCY (ICD-266.2)    Route: IM    Site: R deltoid    Exp Date: 02/12/2012    Lot #: [id number]    Mfr: American Regent    Patient tolerated injection without complications    Given by: Mervin Hack CMA Duncan Dull) (October 27, 2010 8:35 AM)  Orders Added: 1)  Vit B12 1000 mcg [J3420] 2)  Admin of Therapeutic Inj  intramuscular or subcutaneous [[id number]]

## 2010-12-15 NOTE — Letter (Signed)
[facility]   Imported By: Lester [place] 04/30/2010 08:28:37  _____________________________________________________________________  External Attachment:    Type:   Image     Comment:   External Document

## 2010-12-15 NOTE — Letter (Signed)
Summary: [facility]  [facility]   Imported By: Maryln Gottron 12/07/2010 13:18:39  _____________________________________________________________________  External Attachment:    Type:   Image     Comment:   External Document

## 2010-12-15 NOTE — Progress Notes (Signed)
Summary: returning call/ called  pfh,rn  Phone Note Call from Patient Call back at Home Phone [phone]   Caller: Patient Reason for Call: Talk to Nurse Summary of Call: returning call Initial call taken by: Migdalia Dk,  March 10, 2010 8:13 AM  Follow-up for Phone Call        wife aware of results.  Pt going to see a "kidney doctor that his primary care is sending him to". Follow-up by: Charolotte Capuchin, RN,  March 10, 2010 8:41 AM

## 2010-12-21 NOTE — Assessment & Plan Note (Signed)
Summary: COUGH,DRAINAGE/CLE   Vital Signs:  Patient profile:   77 year old male Height:      71 inches Weight:      260.75 pounds BMI:     36.50 O2 Sat:      95 % on 2 L/min Temp:     98.3 degrees F oral Pulse rate:   96 / minute Pulse rhythm:   regular BP sitting:   116 / 58  (left arm) Cuff size:   large  Vitals Entered By: Delilah Shan CMA  Dull) (December 13, 2010 2:32 PM)  O2 Flow:  2 L/min CC: Cough, drainage   History of Present Illness: Schedule for cataract surgery on next Tuesday.  Recently with cough and post nasal gtt.  On O2 at baseline.  No fevers.  Occ sputum, no sig increase from baseline.  No ear pain.  No ST.  No  change in baseline breathing.  Not short of breath. No wheeze.    Allergies: 1)  Penicillin 2)  Cardura  Review of Systems       See HPI.  Otherwise negative.    Physical Exam  General:  no apparent distress normocephalic atraumatic on O2 mucous membranes moist, no erythema, mild cobblestoning tm wnl nasal epithelium injected neck supple regular rate and rhythm scattered ronchi but no increase in wob  nail beds well perfused   Impression & Recommendations:  Problem # 1:  BRONCHITIS- ACUTE (ICD-466.0) Given the scattered ronchi, will use SABA as needed if cough continues and start antibiotics today.  The patient is to notify the eye clinic.  Nontoxic and okay for outpatient follow up.  He agrees.   His updated medication list for this problem includes:    Spiriva Handihaler 18 Mcg Caps (Tiotropium bromide monohydrate) ..... Inhale 1 capsule by mouth once a day    Zithromax 250 Mg Tabs (Azithromycin) .Marland Kitchen... 2 by mouth today and then 1 by mouth once daily    Ventolin Hfa 108 (90 Base) Mcg/act Aers (Albuterol sulfate) .Marland Kitchen... 1-2 puffs every 4 hours as needed for cough  Orders: Prescription Created Electronically [phone])  Complete Medication List: 1)  Spiriva Handihaler 18 Mcg Caps (Tiotropium bromide monohydrate) .... Inhale 1 capsule  by mouth once a day 2)  Furosemide 80 Mg Tabs (Furosemide) .... One by mouth once daily and 1/2 tablet in the evening on mon, wed, fri 3)  Imdur 30 Mg Tb24 (Isosorbide mononitrate) .... 2 by mouth once daily 4)  Toprol Xl 100 Mg Tb24 (Metoprolol succinate) .... One by mouth once daily 5)  Cozaar 50 Mg Tabs (Losartan potassium) .... One by mouth two times a day 6)  Plavix 75 Mg Tabs (Clopidogrel bisulfate) .... One by mouth once daily 7)  Zocor 20 Mg Tabs (Simvastatin) .... One by mouth once daily 8)  Aspirin 325 Mg Tabs (Aspirin) .... One by mouth qd 9)  Allopurinol 300 Mg Tabs (Allopurinol) .... One by mouth qd 10)  Oxygen  .... 2 litres q 24 hours 11)  Nitroglycerin 0.4 Mg Subl (Nitroglycerin) .... As needed chest pain 12)  B12 Shot  .... Once monthly 13)  Zithromax 250 Mg Tabs (Azithromycin) .... 2 by mouth today and then 1 by mouth once daily 14)  Ventolin Hfa 108 (90 Base) Mcg/act Aers (Albuterol sulfate) .Marland Kitchen.. 1-2 puffs every 4 hours as needed for cough  Patient Instructions: 1)  Get plenty of rest, drink clear liquids, and use Tylenol for fever and comfort. Start the antibiotics today and use  the ventolin inhaler if needed for cough.  If you get short of breath, notify the clinic or a doctor.  Call the eye doctor with an update.  Prescriptions: VENTOLIN HFA 108 (90 BASE) MCG/ACT AERS (ALBUTEROL SULFATE) 1-2 puffs every 4 hours as needed for cough  #1 x 1   Entered and Authorized by:   Crawford Givens MD   Signed by:   Crawford Givens MD on 12/13/2010   Method used:   Print then Give to Patient   RxID:   [id number] ZITHROMAX 250 MG TABS (AZITHROMYCIN) 2 by mouth today and then 1 by mouth once daily  #6 x 0   Entered and Authorized by:   Crawford Givens MD   Signed by:   Crawford Givens MD on 12/13/2010   Method used:   Electronically to        [facility]  [address] [phone]* (retail)       [address]       [place], [place]  [id number]       Ph:  [phone] or [phone]       Fax: [phone]   RxID:   [id number]    Orders Added: 1)  Prescription Created Electronically [[id number]] 2)  Est. Patient Level III [43329]    Current Allergies (reviewed today): PENICILLIN CARDURA

## 2010-12-21 NOTE — Progress Notes (Signed)
Summary: Records Request  Faxed OV, EKG & Stress to Jodie at [facility] ([phone]).  Debby Freiberg  December 13, 2010 4:17 PM

## 2010-12-22 ENCOUNTER — Emergency Department (HOSPITAL_COMMUNITY): Payer: Medicare Other

## 2010-12-22 ENCOUNTER — Other Ambulatory Visit: Payer: Self-pay | Admitting: Family Medicine

## 2010-12-22 ENCOUNTER — Encounter: Payer: Self-pay | Admitting: Family Medicine

## 2010-12-22 ENCOUNTER — Ambulatory Visit (INDEPENDENT_AMBULATORY_CARE_PROVIDER_SITE_OTHER)
Admission: RE | Admit: 2010-12-22 | Discharge: 2010-12-22 | Disposition: A | Discharge status: Home / Self Care | Payer: Medicare Other | Source: Ambulatory Visit | Attending: Family Medicine | Admitting: Family Medicine

## 2010-12-22 ENCOUNTER — Ambulatory Visit (INDEPENDENT_AMBULATORY_CARE_PROVIDER_SITE_OTHER): Payer: Medicare Other | Admitting: Family Medicine

## 2010-12-22 ENCOUNTER — Other Ambulatory Visit: Payer: Self-pay | Admitting: *Deleted

## 2010-12-22 ENCOUNTER — Ambulatory Visit
Admission: RE | Admit: 2010-12-22 | Discharge: 2010-12-22 | Disposition: A | Discharge status: Home / Self Care | Payer: Medicare Other | Source: Ambulatory Visit | Attending: Family Medicine | Admitting: Family Medicine

## 2010-12-22 ENCOUNTER — Inpatient Hospital Stay (HOSPITAL_COMMUNITY)
Admission: EM | Admit: 2010-12-22 | Discharge: 2011-01-04 | DRG: 193 | Disposition: A | Discharge status: Skilled Nursing Facility | Payer: Medicare Other | Attending: Internal Medicine | Admitting: Internal Medicine

## 2010-12-22 DIAGNOSIS — D696 Thrombocytopenia, unspecified: Secondary | ICD-10-CM | POA: Diagnosis not present

## 2010-12-22 DIAGNOSIS — Z9981 Dependence on supplemental oxygen: Secondary | ICD-10-CM

## 2010-12-22 DIAGNOSIS — R531 Weakness: Secondary | ICD-10-CM

## 2010-12-22 DIAGNOSIS — K5730 Diverticulosis of large intestine without perforation or abscess without bleeding: Secondary | ICD-10-CM | POA: Diagnosis present

## 2010-12-22 DIAGNOSIS — Z66 Do not resuscitate: Secondary | ICD-10-CM | POA: Diagnosis present

## 2010-12-22 DIAGNOSIS — R404 Transient alteration of awareness: Secondary | ICD-10-CM | POA: Diagnosis present

## 2010-12-22 DIAGNOSIS — R5381 Other malaise: Secondary | ICD-10-CM

## 2010-12-22 DIAGNOSIS — I252 Old myocardial infarction: Secondary | ICD-10-CM

## 2010-12-22 DIAGNOSIS — E538 Deficiency of other specified B group vitamins: Secondary | ICD-10-CM | POA: Diagnosis present

## 2010-12-22 DIAGNOSIS — I2510 Atherosclerotic heart disease of native coronary artery without angina pectoris: Secondary | ICD-10-CM | POA: Diagnosis present

## 2010-12-22 DIAGNOSIS — Z7982 Long term (current) use of aspirin: Secondary | ICD-10-CM

## 2010-12-22 DIAGNOSIS — Z6835 Body mass index (BMI) 35.0-35.9, adult: Secondary | ICD-10-CM

## 2010-12-22 DIAGNOSIS — R5383 Other fatigue: Secondary | ICD-10-CM

## 2010-12-22 DIAGNOSIS — R1310 Dysphagia, unspecified: Secondary | ICD-10-CM | POA: Diagnosis present

## 2010-12-22 DIAGNOSIS — M109 Gout, unspecified: Secondary | ICD-10-CM | POA: Diagnosis present

## 2010-12-22 DIAGNOSIS — E785 Hyperlipidemia, unspecified: Secondary | ICD-10-CM | POA: Diagnosis present

## 2010-12-22 DIAGNOSIS — E875 Hyperkalemia: Secondary | ICD-10-CM | POA: Diagnosis present

## 2010-12-22 DIAGNOSIS — J9600 Acute respiratory failure, unspecified whether with hypoxia or hypercapnia: Secondary | ICD-10-CM | POA: Diagnosis present

## 2010-12-22 DIAGNOSIS — I4891 Unspecified atrial fibrillation: Secondary | ICD-10-CM | POA: Diagnosis present

## 2010-12-22 DIAGNOSIS — F1011 Alcohol abuse, in remission: Secondary | ICD-10-CM | POA: Diagnosis present

## 2010-12-22 DIAGNOSIS — F0390 Unspecified dementia without behavioral disturbance: Secondary | ICD-10-CM | POA: Diagnosis present

## 2010-12-22 DIAGNOSIS — IMO0002 Reserved for concepts with insufficient information to code with codable children: Secondary | ICD-10-CM | POA: Diagnosis present

## 2010-12-22 DIAGNOSIS — I129 Hypertensive chronic kidney disease with stage 1 through stage 4 chronic kidney disease, or unspecified chronic kidney disease: Secondary | ICD-10-CM | POA: Diagnosis present

## 2010-12-22 DIAGNOSIS — Z515 Encounter for palliative care: Secondary | ICD-10-CM

## 2010-12-22 DIAGNOSIS — I509 Heart failure, unspecified: Secondary | ICD-10-CM | POA: Diagnosis present

## 2010-12-22 DIAGNOSIS — J4489 Other specified chronic obstructive pulmonary disease: Secondary | ICD-10-CM | POA: Diagnosis present

## 2010-12-22 DIAGNOSIS — N179 Acute kidney failure, unspecified: Secondary | ICD-10-CM | POA: Diagnosis present

## 2010-12-22 DIAGNOSIS — Z87891 Personal history of nicotine dependence: Secondary | ICD-10-CM

## 2010-12-22 DIAGNOSIS — N189 Chronic kidney disease, unspecified: Secondary | ICD-10-CM | POA: Diagnosis present

## 2010-12-22 DIAGNOSIS — G9341 Metabolic encephalopathy: Secondary | ICD-10-CM | POA: Diagnosis present

## 2010-12-22 DIAGNOSIS — I279 Pulmonary heart disease, unspecified: Secondary | ICD-10-CM | POA: Diagnosis present

## 2010-12-22 DIAGNOSIS — D649 Anemia, unspecified: Secondary | ICD-10-CM | POA: Diagnosis present

## 2010-12-22 DIAGNOSIS — J449 Chronic obstructive pulmonary disease, unspecified: Secondary | ICD-10-CM | POA: Diagnosis present

## 2010-12-22 DIAGNOSIS — I5022 Chronic systolic (congestive) heart failure: Secondary | ICD-10-CM | POA: Diagnosis present

## 2010-12-22 DIAGNOSIS — Z9861 Coronary angioplasty status: Secondary | ICD-10-CM

## 2010-12-22 DIAGNOSIS — M1990 Unspecified osteoarthritis, unspecified site: Secondary | ICD-10-CM | POA: Diagnosis present

## 2010-12-22 DIAGNOSIS — J690 Pneumonitis due to inhalation of food and vomit: Secondary | ICD-10-CM | POA: Diagnosis present

## 2010-12-22 DIAGNOSIS — E870 Hyperosmolality and hypernatremia: Secondary | ICD-10-CM | POA: Diagnosis present

## 2010-12-22 DIAGNOSIS — J189 Pneumonia, unspecified organism: Principal | ICD-10-CM | POA: Diagnosis present

## 2010-12-22 LAB — URINE MICROSCOPIC-ADD ON

## 2010-12-22 LAB — DIFFERENTIAL
Basophils Relative: 0 % (ref 0–1)
Eosinophils Absolute: 0.1 10*3/uL (ref 0.0–0.7)
Eosinophils Relative: 0 % (ref 0–5)
Lymphs Abs: 0.5 10*3/uL — ABNORMAL LOW (ref 0.7–4.0)
Monocytes Absolute: 0.6 10*3/uL (ref 0.1–1.0)
Monocytes Relative: 3 % (ref 3–12)
Neutrophils Relative %: 94 % — ABNORMAL HIGH (ref 43–77)

## 2010-12-22 LAB — CBC
MCV: 92.1 fL (ref 78.0–100.0)
Platelets: 225 10*3/uL (ref 150–400)
RBC: 3.8 MIL/uL — ABNORMAL LOW (ref 4.22–5.81)
RDW: 14.7 % (ref 11.5–15.5)
WBC: 19.6 10*3/uL — ABNORMAL HIGH (ref 4.0–10.5)

## 2010-12-22 LAB — BASIC METABOLIC PANEL
BUN: 143 mg/dL — ABNORMAL HIGH (ref 6–23)
CO2: 28 mEq/L (ref 19–32)
Calcium: 8.7 mg/dL (ref 8.4–10.5)
Creatinine, Ser: 4.59 mg/dL — ABNORMAL HIGH (ref 0.4–1.5)
GFR calc Af Amer: 15 mL/min — ABNORMAL LOW (ref >60)
Glucose, Bld: 142 mg/dL — ABNORMAL HIGH (ref 70–99)

## 2010-12-22 LAB — CONVERTED CEMR LAB
Nitrite: NEGATIVE
Specific Gravity, Urine: 1.015
WBC Urine, dipstick: NEGATIVE

## 2010-12-22 LAB — URINALYSIS, ROUTINE W REFLEX MICROSCOPIC
Ketones, ur: NEGATIVE mg/dL
Leukocytes, UA: NEGATIVE
Nitrite: NEGATIVE
Specific Gravity, Urine: 1.015 (ref 1.005–1.030)
Urine Glucose, Fasting: NEGATIVE mg/dL
pH: 5 (ref 5.0–8.0)

## 2010-12-22 LAB — HEPATIC FUNCTION PANEL
AST: 40 U/L — ABNORMAL HIGH (ref 0–37)
Albumin: 2.5 g/dL — ABNORMAL LOW (ref 3.5–5.2)
Total Bilirubin: 0.5 mg/dL (ref 0.3–1.2)
Total Protein: 5.8 g/dL — ABNORMAL LOW (ref 6.0–8.3)

## 2010-12-22 LAB — CREATININE, URINE, RANDOM: Creatinine, Urine: 77.6 mg/dL

## 2010-12-22 LAB — MRSA PCR SCREENING: MRSA by PCR: NEGATIVE

## 2010-12-23 ENCOUNTER — Encounter: Payer: Self-pay | Admitting: Family Medicine

## 2010-12-23 LAB — MRSA PCR SCREENING: MRSA by PCR: NEGATIVE

## 2010-12-23 LAB — CBC
HCT: 31.3 % — ABNORMAL LOW (ref 39.0–52.0)
Hemoglobin: 10.1 g/dL — ABNORMAL LOW (ref 13.0–17.0)
MCH: 29.9 pg (ref 26.0–34.0)
MCHC: 32.3 g/dL (ref 30.0–36.0)
RDW: 14.7 % (ref 11.5–15.5)

## 2010-12-23 LAB — BASIC METABOLIC PANEL
CO2: 29 mEq/L (ref 19–32)
Calcium: 8.3 mg/dL — ABNORMAL LOW (ref 8.4–10.5)
Creatinine, Ser: 3.1 mg/dL — ABNORMAL HIGH (ref 0.4–1.5)
GFR calc Af Amer: 24 mL/min — ABNORMAL LOW (ref >60)
GFR calc non Af Amer: 19 mL/min — ABNORMAL LOW (ref >60)
Glucose, Bld: 115 mg/dL — ABNORMAL HIGH (ref 70–99)

## 2010-12-24 LAB — RENAL FUNCTION PANEL
Albumin: 1.9 g/dL — ABNORMAL LOW (ref 3.5–5.2)
BUN: 97 mg/dL — ABNORMAL HIGH (ref 6–23)
CO2: 30 mEq/L (ref 19–32)
Calcium: 8.2 mg/dL — ABNORMAL LOW (ref 8.4–10.5)
Chloride: 109 mEq/L (ref 96–112)
Creatinine, Ser: 2.2 mg/dL — ABNORMAL HIGH (ref 0.4–1.5)
GFR calc Af Amer: 35 mL/min — ABNORMAL LOW (ref >60)
GFR calc non Af Amer: 29 mL/min — ABNORMAL LOW (ref >60)
Glucose, Bld: 104 mg/dL — ABNORMAL HIGH (ref 70–99)
Phosphorus: 5.2 mg/dL — ABNORMAL HIGH (ref 2.3–4.6)
Potassium: 5.2 mEq/L — ABNORMAL HIGH (ref 3.5–5.1)
Sodium: 144 mEq/L (ref 135–145)

## 2010-12-24 LAB — CBC
HCT: 31.2 % — ABNORMAL LOW (ref 39.0–52.0)
Hemoglobin: 9.8 g/dL — ABNORMAL LOW (ref 13.0–17.0)
RBC: 3.2 MIL/uL — ABNORMAL LOW (ref 4.22–5.81)

## 2010-12-25 ENCOUNTER — Inpatient Hospital Stay (HOSPITAL_COMMUNITY): Payer: Medicare Other

## 2010-12-25 LAB — CBC
HCT: 31.1 % — ABNORMAL LOW (ref 39.0–52.0)
Hemoglobin: 9.7 g/dL — ABNORMAL LOW (ref 13.0–17.0)
RBC: 3.19 MIL/uL — ABNORMAL LOW (ref 4.22–5.81)
RDW: 14.9 % (ref 11.5–15.5)
WBC: 8.9 10*3/uL (ref 4.0–10.5)

## 2010-12-25 LAB — BASIC METABOLIC PANEL
CO2: 27 mEq/L (ref 19–32)
Chloride: 110 mEq/L (ref 96–112)
GFR calc non Af Amer: 27 mL/min — ABNORMAL LOW (ref >60)
Glucose, Bld: 132 mg/dL — ABNORMAL HIGH (ref 70–99)
Potassium: 6.2 mEq/L — ABNORMAL HIGH (ref 3.5–5.1)
Sodium: 144 mEq/L (ref 135–145)

## 2010-12-25 NOTE — H&P (Signed)
NAME:  Bill Walker, Bill Walker NO.:  [id number]  MEDICAL RECORD NO.:  [id number]           PATIENT TYPE:  E  LOCATION:  [facility]                         FACILITY:  [facility]  PHYSICIAN:  Vania Rea, M.D. DATE OF BIRTH:  04-26-1928  DATE OF ADMISSION:  12/22/2010 DATE OF DISCHARGE:                             HISTORY & PHYSICAL   PRIMARY CARE PHYSICIAN:  Dr. Roxy Manns, [facility]  CARDIOLOGIST:  [facility], Noralyn Pick. Eden Emms, MD, [facility]  CHIEF COMPLAINT:  Confusion for the past few days.  HISTORY OF PRESENT ILLNESS:  This is an 77 year old Caucasian gentleman with multiple medical problems including hypertension, COPD, coronary artery disease, who was diagnosed with a bronchitis about 10 days by his primary care physician, given a course of Zithromax, but this failed to improve.  He has been more lethargic in over the past few days, has had worsening cough, increased lethargy, and confusion, and some hallucinations.  Eventually, he was brought back to the emergency room today, evaluated at his primary physician's office and sent to the emergency room for admission.  The patient's wife who is giving most of the history of reports that he has become more lethargic.  She has been giving him his medications only episodically because she is worried about him getting dehydrated.  He takes both Lasix and Cozaar, but has been taking them only on and off and has not taken any for at least the past 2 days.  He has been having no fever, no chest pain.  He has been having a cough productive of white sputum.  He has been having no vomiting or diarrhea.  At baseline, the patient is able to drive and walks without the assistance of any device.  PAST MEDICAL HISTORY:  Coronary artery disease status post acute MI, status post stenting, hyperlipidemia, vitamin B12 deficiency, thrombocytopenia, cor pulmonale, history of alcohol abuse, history of tobacco abuse, remote  history of kidney disease, osteoarthritis, gout, diverticulosis, COPD, chronic anemia, hyperlipidemia, bronchiectasis, systolic heart failure, ejection fraction of 45%.  MEDICATIONS: 1. Zithromax 500 mg daily. 2. Albuterol inhaler 1-2 puffs every 4 hours as needed. 3. Spiriva 1 capsule inhaled daily. 4. Vitamin B12 injection monthly. 5. Nitroglycerin sublingually p.r.n. 6. Allopurinol 300 mg daily. 7. Coated aspirin 325 mg daily. 8. Zocor 20 mg daily. 9. Plavix 75 mg daily. 10.Toprol-XL 100 mg daily. 11.Cozaar 50 mg twice daily. 12.Imdur 30 mg 2 tablets daily. 13.Furosemide 80 mg daily and 40 mg in the afternoon on Monday,     Wednesday, Friday.  ALLERGIES:  To PENICILLIN which causes swelling and DOXAZOSIN which cause a rash.  SOCIAL HISTORY:  Retired, lives in [place] with his wife.  He quit smoking in 1998 after smoking 1 pack a day for 48 years.  Has a past history of alcohol abuse.  No current alcohol use.  FAMILY HISTORY:  Unable to obtain because of the patient's confusion at this time.  REVIEW OF SYSTEMS:  Other than noted above, unable to obtain.  PHYSICAL EXAMINATION:  GENERAL:  Lethargic and confused, obese, elderly Caucasian gentleman, lying in the stretcher. VITAL SIGNS:  His temperature  is 98.5, his pulse is 117, respirations 24, blood pressure 94/60.  He is saturating 100% on 6 liters.  The patient usually uses 2 liters of oxygen at home.  In his doctor's office today, he was saturating at 80% on 2 liters. HEENT:  His pupils are round and equal.  Mucous membranes pink, dry, anicteric.  No cervical lymphadenopathy.  He has a very thick neck. Unable to appreciate thyromegaly or carotid bruit. CHEST:  He has coarse crackles at the right base and diffuse mild rhonchi. CARDIOVASCULAR SYSTEM:  He is tachycardic.  No murmur heard. ABDOMEN:  Massively obese but soft, nontender.  No masses. EXTREMITIES:  Without edema.  Muscle tone and bulk are good.   Dorsalis pedis pulses 2+ bilaterally.  He has arthritic deformities of the hands, knees, ankles. CENTRAL NERVOUS SYSTEM:  Cranial nerves III-XII appeared grossly intact. He has no focal lateralizing signs.  He, however, has generalized lethargy. PSYCHIATRIC:  He is alert and oriented to himself.  He is not oriented to his location nor to date nor he is unable to describe the reason for being in hospital.  LABS:  His white count is 19.6, hemoglobin is 11.3, platelets 225, 94% neutrophils, absolute neutrophil count 18.5.  His sodium is 134, potassium 5.2, chloride 92, CO2 28, glucose 142, BUN 143, creatinine 4.59, calcium 8.7.  His lactic acid is 1.4.  Chest x-ray shows cardiomegaly with mild pulmonary vascular congestion, right upper lobe lung airspace disease, suspicious for pneumonia.  ASSESSMENT: 1. Acute respiratory failure. 2. Multilobar pneumonia due to failed outpatient treatment. 3. Acute renal failure. 4. Coronary artery disease. 5. Chronic obstructive pulmonary disease. 6. Congestive heart failure. 7. Gout. 8. Coronary artery disease.  PLAN: 1. Because of this patient's multiple medical problems and his     family's desire for him to be a full code, we will admit him to     ICU for vigorous hydration.  We will give antibiotic     coverage with aztreonam, vancomycin, and Avelox in view of his     penicillin allergy. 2. Because of his hypotension, we will hold his antihypertensives and     will also hold nephrotoxins because of his renal failure. 3. Because of his markedly elevated BUN, we will also need to consider     gastrointestinal bleeding and will get a stool Hemoccult but it may     also be that this gentleman's mental status is due to metabolic     encephalopathy from uremia.  We will therefore consult neurologist     for assistance with management. 4. As the patient will be in the ICU, we will advise Critical     Care Services for assistance with management.   Other plans as per     orders.     Vania Rea, M.D.     LC/MEDQ  D:  12/22/2010  T:  12/22/2010  Job:  [id number]  cc:   Dr. Ivor Reining C. Eden Emms, MD, [facility]  Electronically Signed by Vania Rea M.D. on 12/25/2010 01:44:49 AM

## 2010-12-26 LAB — BASIC METABOLIC PANEL
BUN: 103 mg/dL — ABNORMAL HIGH (ref 6–23)
Calcium: 8.7 mg/dL (ref 8.4–10.5)
Chloride: 112 mEq/L (ref 96–112)
GFR calc non Af Amer: 26 mL/min — ABNORMAL LOW (ref >60)
GFR calc non Af Amer: 28 mL/min — ABNORMAL LOW (ref >60)
Glucose, Bld: 149 mg/dL — ABNORMAL HIGH (ref 70–99)
Glucose, Bld: 159 mg/dL — ABNORMAL HIGH (ref 70–99)
Potassium: 5.1 mEq/L (ref 3.5–5.1)
Sodium: 149 mEq/L — ABNORMAL HIGH (ref 135–145)
Sodium: 150 mEq/L — ABNORMAL HIGH (ref 135–145)

## 2010-12-26 LAB — CK TOTAL AND CKMB (NOT AT ARMC)
CK, MB: 3.4 ng/mL (ref 0.3–4.0)
Relative Index: INVALID (ref 0.0–2.5)

## 2010-12-26 LAB — IRON AND TIBC
Saturation Ratios: 30 % (ref 20–55)
UIBC: 123 ug/dL

## 2010-12-26 LAB — TROPONIN I: Troponin I: 0.05 ng/mL (ref 0.00–0.06)

## 2010-12-26 LAB — FOLATE: Folate: 8.1 ng/mL

## 2010-12-27 LAB — BASIC METABOLIC PANEL
BUN: 105 mg/dL — ABNORMAL HIGH (ref 6–23)
CO2: 32 mEq/L (ref 19–32)
Chloride: 120 mEq/L — ABNORMAL HIGH (ref 96–112)
Chloride: 118 mEq/L — ABNORMAL HIGH (ref 96–112)
Creatinine, Ser: 2.13 mg/dL — ABNORMAL HIGH (ref 0.4–1.5)
GFR calc Af Amer: 34 mL/min — ABNORMAL LOW (ref >60)
GFR calc Af Amer: 36 mL/min — ABNORMAL LOW (ref >60)
GFR calc non Af Amer: 28 mL/min — ABNORMAL LOW (ref >60)
GFR calc non Af Amer: 30 mL/min — ABNORMAL LOW (ref >60)
Potassium: 5.9 mEq/L — ABNORMAL HIGH (ref 3.5–5.1)
Sodium: 154 mEq/L — ABNORMAL HIGH (ref 135–145)

## 2010-12-28 LAB — BASIC METABOLIC PANEL
BUN: 95 mg/dL — ABNORMAL HIGH (ref 6–23)
CO2: 35 mEq/L — ABNORMAL HIGH (ref 19–32)
Chloride: 116 mEq/L — ABNORMAL HIGH (ref 96–112)
Potassium: 4.8 mEq/L (ref 3.5–5.1)

## 2010-12-28 LAB — CARDIAC PANEL(CRET KIN+CKTOT+MB+TROPI)
CK, MB: 3.3 ng/mL (ref 0.3–4.0)
CK, MB: 2.9 ng/mL (ref 0.3–4.0)
Relative Index: INVALID (ref 0.0–2.5)
Troponin I: 0.08 ng/mL — ABNORMAL HIGH (ref 0.00–0.06)
Troponin I: 0.09 ng/mL — ABNORMAL HIGH (ref 0.00–0.06)

## 2010-12-28 LAB — TSH: TSH: 2.07 u[IU]/mL (ref 0.350–4.500)

## 2010-12-28 LAB — MAGNESIUM: Magnesium: 2.3 mg/dL (ref 1.5–2.5)

## 2010-12-28 LAB — CULTURE, BLOOD (ROUTINE X 2)

## 2010-12-29 ENCOUNTER — Inpatient Hospital Stay (HOSPITAL_COMMUNITY): Payer: Medicare Other

## 2010-12-29 LAB — RENAL FUNCTION PANEL
BUN: 75 mg/dL — ABNORMAL HIGH (ref 6–23)
CO2: 38 mEq/L — ABNORMAL HIGH (ref 19–32)
Chloride: 117 mEq/L — ABNORMAL HIGH (ref 96–112)
Creatinine, Ser: 1.7 mg/dL — ABNORMAL HIGH (ref 0.4–1.5)
GFR calc non Af Amer: 39 mL/min — ABNORMAL LOW (ref >60)

## 2010-12-29 LAB — BASIC METABOLIC PANEL
CO2: 37 mEq/L — ABNORMAL HIGH (ref 19–32)
Calcium: 8.3 mg/dL — ABNORMAL LOW (ref 8.4–10.5)
Glucose, Bld: 152 mg/dL — ABNORMAL HIGH (ref 70–99)
Sodium: 161 mEq/L (ref 135–145)

## 2010-12-29 LAB — CBC
HCT: 38.5 % — ABNORMAL LOW (ref 39.0–52.0)
Hemoglobin: 11.6 g/dL — ABNORMAL LOW (ref 13.0–17.0)
MCH: 29.7 pg (ref 26.0–34.0)
MCHC: 30.1 g/dL (ref 30.0–36.0)

## 2010-12-29 NOTE — Assessment & Plan Note (Signed)
Summary: not eating, not feeling well/alc   Vital Signs:  Patient profile:   77 year old male Weight:      259 pounds O2 Sat:      80 % on 2 L/min Temp:     97.6 degrees F oral Pulse rate:   110 / minute Pulse rhythm:   regular BP sitting:   118 / 80  (left arm) Cuff size:   large  Vitals Entered By: Selena Batten Dance CMA (AAMA) (December 22, 2010 11:44 AM)  O2 Flow:  2 L/min  Serial Vital Signs/Assessments:  Time      Position  BP       Pulse  Resp  Temp     By                              120                   Eustaquio Boyden  MD                              125                   Eustaquio Boyden  MD                                PEF    PreRx  PostRx Time      O2 Sat  O2 Type     L/min  L/min  L/min   By           80  %   2 L/min                           Eustaquio Boyden  MD           86  %   4 L/min                           Eustaquio Boyden  MD  CC: Not eating/not feeling well, Depression Comments **Unable to obtain SPO2** (Attempted on 2 different machines)   History of Present Illness: CC: not feeling well.  Presents with wife and son who help give story.  on chronic 2L O2.  h/o COPD, bronchiectasis, CAD, DM, HTN, HLD  seen last week by Dr. Algis Downs, thought bronchitis and treated with zpck.  h/o recurrent bronchitis.  feeling tired.  Pt states more short of breath, still has congestion and cough productive of milky sputum.  taking 2 ensures a day, drinking some water.  Not eating or drinking much.  Worried about being dehydrated.  Starting to hallucinate and babble last night.  No pain, no fevers/chills, no diarrhea or stool changes, urinating less.  Last urine this am, last BM yesterday.  Not taking medicines as should be for last few days.  denies pain with urination.  cancelled cataract surgery because not feeling well.  did recently take 2 days worth of medicines last wednesday by accident (8 days ago)  labwork done at [facility] for Dr. Allena Katz kidney doctor last week.   last week O2 was 95% on 2LNC.  Current Medications (verified): 1)  Spiriva Handihaler 18 Mcg Caps (Tiotropium Bromide Monohydrate) .... Inhale 1 Capsule By Mouth Once  A Day 2)  Furosemide 80 Mg  Tabs (Furosemide) .... One By Mouth Once Daily and 1/2 Tablet in The Evening On Mon, Wed, Fri 3)  Imdur 30 Mg  Tb24 (Isosorbide Mononitrate) .... 2 By Mouth Once Daily 4)  Toprol Xl 100 Mg  Tb24 (Metoprolol Succinate) .... One By Mouth Once Daily 5)  Cozaar 50 Mg  Tabs (Losartan Potassium) .... One By Mouth Two Times A Day 6)  Plavix 75 Mg  Tabs (Clopidogrel Bisulfate) .... One By Mouth Once Daily 7)  Zocor 20 Mg  Tabs (Simvastatin) .... One By Mouth Once Daily 8)  Aspirin 325 Mg  Tabs (Aspirin) .... One By Mouth Qd 9)  Allopurinol 300 Mg  Tabs (Allopurinol) .... One By Mouth Qd 10)  Oxygen .... 2 Litres Q 24 Hours 11)  Nitroglycerin 0.4 Mg Subl (Nitroglycerin) .... As Needed Chest Pain 12)  B12 Shot .... Once Monthly 13)  Zithromax 250 Mg Tabs (Azithromycin) .... 2 By Mouth Today and Then 1 By Mouth Once Daily 14)  Ventolin Hfa 108 (90 Base) Mcg/act Aers (Albuterol Sulfate) .Marland Kitchen.. 1-2 Puffs Every 4 Hours As Needed For Cough  Allergies: 1)  Penicillin 2)  Cardura  Past History:  Past Medical History: Last updated: 11/11/2009 CARDIOMYOPATHY (ICD-425.4) HYPERTENSION (ICD-401.9) CORONARY ARTERY DISEASE (ICD-414.00) MI (ICD-410.90) HYPERLIPIDEMIA (ICD-272.4) VITAMIN B12 DEFICIENCY (ICD-266.2) PNEUMONIA, LEFT (ICD-486) PNEUMONIA ORGANISM NOS (ICD-486) THROMBOCYTOPENIA (ICD-287.5) COR PULMONALE (ICD-416.9) HEARING LOSS (ICD-389.9) Hx of ALCOHOL ABUSE (ICD-305.00) Hx of TOBACCO ABUSE (ICD-305.1) RENAL INSUFFICIENCY (ICD-588.9) OSTEOARTHRITIS (ICD-715.90) GOUT (ICD-274.9) DIVERTICULOSIS, COLON (ICD-562.10) COPD (ICD-496) ANEMIA-NOS (ICD-285.9)(follwed by heme) PURE HYPERCHOLESTEROLEMIA (ICD-272.0)  BRONCHIECTASIS    -  CT Chest 07/07/09 mild  bilateral lower lobe medial  bronchiectasis Coronary artery disease Diverticulosis, colon pulm- Wert (in hosp) cardiol- Nishan  heme- Dr Park Breed  Past Surgical History: Last updated: 07/29/2009 PTCA/stent (1997) Cardiac catheterization and stent implantation.  Coronary artery disease, status post prior stenting of the mid right coronary artery in 1997 with 30% left main stenosis, 70-80% stenosis in the proximal left anterior descending with 80% stenosis in the second large diagonal branch and total occlusion of the mid left anterior descending,50% stenosis in the distal circumflex artery, a 95% stenosis in the proximal right coronary artery with less than 20% stenosis at the stent site in the mid right coronary artery and 70% stenosis in the distal right coronary artery with apical wall akinesis. Successful placement of tandem overlying stent in the proximal right coronary artery with improvement in percent diameter narrowing from 95% to 0%. Hosp- CHF (06/1999) Echo- EF 45%- dilated RV MRILS- disk disease (09/1999) Hosp- diverticul- bleed (10/2000) EGD- gastritis (12/1999) Colonoscopy- diverticulosis (12/1999) Cardiac cath- 4 vessel disease- stent PTCA (12/2000) Cardiolite- no change, LVH (01/2002) Olecranon bursitis- gout, not operable (10/2004) Admit- EGD, candidal esophagitis (03/2005) cardiac cath 8/10 - with total LAD occlusion/ other partial occlusions/patent stent   Social History: Last updated: 11/11/2009  The patient lives in [place] with his wife.  He is   retired.  He has no significant tobacco, EtOH, or illicit drug use   history.  He takes no herbal medications, has a regular diet, and does   not regularly exercise secondary to his comorbidities.  He is able to   get round a little bit, but cannot walk long distances.  Former smoker.  Quit in 1998.  Smoked for approx 48 yrs 1 ppd  Review of Systems       per HPI  Physical Exam  General:  tired  appearing, hard of hearing Head:  normocephalic,  atraumatic, and no abnormalities observed.   Mouth:  pharynx erythematous, no exudates, moist Neck:  .supple with full rom and no masses or thyromegally, no JVD or carotid bruit Lungs:  no increased work of breathing however does have rales/crackles bibasilarly RLL>LLL Heart:  tachycardic, regular.  no murmur appreciated Pulses:  2+ rad Extremities:  cold extremities, nailbeds well perfused.  initially some cyanosis.  nonpitting edema   Impression & Recommendations:  Problem # 1:  WEAKNESS (ICD-780.79) with decreased PO intake as well as productive cough, AMS.  increased O2 to 4L, O2sat up to 86%, not increasing.  tachycardic.  concern for pneumonia based on clinical picture as well as dehydration in this 77 yo with h/o COPD and bronchiectasis.  unsure baseline but O2 last week was 95% 2L [place].  initially was planning on treating outpt with alb/avelox but given tachycardia and O2 not improving, sent to ER for further eval, rehydration.  UA - doubt acute infection, + blood and protein.  micro with bacteria.  UCx sent. CXR - hazy opacities bibasilarly unable to obtain lateral.  Orders: T-2 View CXR (71020TC) UA Dipstick W/ Micro (manual) (16109) Specimen Handling (99000) T-Culture, Urine ([id number])  Complete Medication List: 1)  Spiriva Handihaler 18 Mcg Caps (Tiotropium bromide monohydrate) .... Inhale 1 capsule by mouth once a day 2)  Furosemide 80 Mg Tabs (Furosemide) .... One by mouth once daily and 1/2 tablet in the evening on mon, wed, fri 3)  Imdur 30 Mg Tb24 (Isosorbide mononitrate) .... 2 by mouth once daily 4)  Toprol Xl 100 Mg Tb24 (Metoprolol succinate) .... One by mouth once daily 5)  Cozaar 50 Mg Tabs (Losartan potassium) .... One by mouth two times a day 6)  Plavix 75 Mg Tabs (Clopidogrel bisulfate) .... One by mouth once daily 7)  Zocor 20 Mg Tabs (Simvastatin) .... One by mouth once daily 8)  Aspirin 325 Mg Tabs (Aspirin) .... One by mouth qd 9)  Allopurinol 300 Mg  Tabs (Allopurinol) .... One by mouth qd 10)  Oxygen  .... 2 litres q 24 hours 11)  Nitroglycerin 0.4 Mg Subl (Nitroglycerin) .... As needed chest pain 12)  B12 Shot  .... Once monthly 13)  Zithromax 250 Mg Tabs (Azithromycin) .... 2 by mouth today and then 1 by mouth once daily 14)  Ventolin Hfa 108 (90 Base) Mcg/act Aers (Albuterol sulfate) .Marland Kitchen.. 1-2 puffs every 4 hours as needed for cough   Patient Instructions: 1)  Keep pushing ensure and water to ensure staying hydrated. 2)  To ER as I'm worried Mr Helbing is dehydrated and with lung infection. 3)  I will try to get records of latest blood work from Dr. Allena Katz 4)  return next week for follow up.   Orders Added: 1)  T-2 View CXR [[id number]] 2)  UA Dipstick W/ Micro (manual) [81000] 3)  Est. Patient Level IV [91478] 4)  Specimen Handling [99000] 5)  T-Culture, Urine [[id number]]    Current Allergies (reviewed today): PENICILLIN CARDURA Laboratory Results   Urine Tests  Date/Time Received: December 22, 2010 12:35 PM  Date/Time Reported: December 22, 2010 12:35 PM   Routine Urinalysis   Color: amber Appearance: Hazy Glucose: negative   (Normal Range: Negative) Bilirubin: negative   (Normal Range: Negative) Ketone: negative   (Normal Range: Negative) Spec. Gravity: 1.015   (Normal Range: 1.003-1.035) Blood: moderate   (Normal Range: Negative) pH: 5.0   (Normal Range: 5.0-8.0) Protein: trace   (  Normal Range: Negative) Urobilinogen: 0.2   (Normal Range: 0-1) Nitrite: negative   (Normal Range: Negative) Leukocyte Esterace: negative   (Normal Range: Negative)  Urine Microscopic WBC/HPF: rare RBC/HPF: 0-3 Bacteria/HPF: 2+ Mucous/HPF: no Epithelial/HPF: rare Crystals/HPF: no Casts/LPF: no Yeast/HPF: no    Comments: read by .........Eustaquio Boyden  MD  December 22, 2010 1:57 PM  UCx sent.

## 2010-12-30 ENCOUNTER — Ambulatory Visit: Payer: Self-pay

## 2010-12-30 LAB — BASIC METABOLIC PANEL
CO2: 39 mEq/L — ABNORMAL HIGH (ref 19–32)
Calcium: 8.6 mg/dL (ref 8.4–10.5)
Chloride: 120 mEq/L — ABNORMAL HIGH (ref 96–112)
Glucose, Bld: 148 mg/dL — ABNORMAL HIGH (ref 70–99)
Sodium: 166 mEq/L (ref 135–145)

## 2010-12-31 LAB — BASIC METABOLIC PANEL
BUN: 31 mg/dL — ABNORMAL HIGH (ref 6–23)
CO2: 40 mEq/L — ABNORMAL HIGH (ref 19–32)
Chloride: 115 mEq/L — ABNORMAL HIGH (ref 96–112)
GFR calc non Af Amer: 47 mL/min — ABNORMAL LOW (ref >60)
Glucose, Bld: 130 mg/dL — ABNORMAL HIGH (ref 70–99)
Potassium: 4.5 mEq/L (ref 3.5–5.1)

## 2011-01-01 LAB — BASIC METABOLIC PANEL
BUN: 21 mg/dL (ref 6–23)
CO2: 36 mEq/L — ABNORMAL HIGH (ref 19–32)
Chloride: 108 mEq/L (ref 96–112)
Creatinine, Ser: 1.24 mg/dL (ref 0.4–1.5)
Glucose, Bld: 117 mg/dL — ABNORMAL HIGH (ref 70–99)

## 2011-01-02 LAB — BASIC METABOLIC PANEL
BUN: 19 mg/dL (ref 6–23)
Creatinine, Ser: 1.29 mg/dL (ref 0.4–1.5)
GFR calc non Af Amer: 53 mL/min — ABNORMAL LOW (ref >60)
Glucose, Bld: 110 mg/dL — ABNORMAL HIGH (ref 70–99)

## 2011-01-02 LAB — CBC
HCT: 31.7 % — ABNORMAL LOW (ref 39.0–52.0)
MCH: 30.2 pg (ref 26.0–34.0)
MCV: 96.6 fL (ref 78.0–100.0)
Platelets: 101 10*3/uL — ABNORMAL LOW (ref 150–400)
RDW: 15.1 % (ref 11.5–15.5)

## 2011-01-02 NOTE — Consult Note (Signed)
[name], Bill Walker              ACCOUNT NO.:  [id number]  MEDICAL RECORD NO.:  [id number]           PATIENT TYPE:  LOCATION:                                 FACILITY:  PHYSICIAN:  Bill Ouderkirk L. Ladona Ridgel, MD  DATE OF BIRTH:  Jun 03, 1928  DATE OF CONSULTATION:  12/24/2010 DATE OF DISCHARGE:                                CONSULTATION   REQUESTING PHYSICIAN:  Lonia Blood, MD  REASON FOR CONSULTATION:  Goals of care related to symptom management. I met with the patient's wife, Bill Walker, her phone number is [phone] at home, cell [phone]; son, Bill Walker [phone]; son, Bill Walker, [phone].  HISTORY OF PRESENT ILLNESS:  The patient is an 77 year old white male with known cardiomyopathy, last ejection fraction of 45%; coronary artery disease with stents recently, deemed not a candidate for further interventions.  The patient has been declining and actually over the last week has required a walker to get around.  Generally, he is kind of a bed to chair, bed to car kind of patient because of his pulmonary disease.  He uses oxygen at home becomes quite dyspneic with minimal exertion.  The patient has been coughing as an outpatient and was started on azithromycin by his primary care physician, but he continued to become lethargic and short of breath.  He was, therefore, returned to the physician's office and was sent to the hospital for further evaluation.  The patient was found to be in renal failure with hypotension and pneumonia.  His course has been complicated by choking episode today with desaturation, tachycardia, and hypotension.  I was asked to meet with the patient's spouse and children to discuss goals of care.  REVIEW OF SYSTEMS:  Unable to be obtained secondary to the patient's poor cognitive status.  PAST MEDICAL HISTORY: 1. Cardiomyopathy, ejection fraction of 45%. 2. Advanced pulmonary emphysema. 3. Mild dementia. 4. Recent myocardial infarction. 5. Hyperlipidemia. 6.  Vitamin B12 deficiency. 7. Thrombocytopenia. 8. Cor pulmonale. 9. History of alcohol abuse, last drink in 2010. 10.History of tobacco abuse. 11.Chronic kidney disease. 12.Osteoarthritis. 13.Gout. 14.Diverticulosis. 15.Chronic anemia.  PERTINENT SURGERIES:  Multiple cardiac catheterizations.  ALLERGIES:  PENICILLIN and DOXAZOSIN.  SOCIAL HISTORY:  He is retired from [employer] and lives in [place] with his wife.  He has two sons.  Quit tobacco in 1998.  He quit alcohol in 2010.  FAMILY HISTORY:  Unable to be obtained secondary to the patient's confusion.  I have reviewed the patient's home and hospital medications.  PHYSICAL EXAMINATION:  VITAL SIGNS:  Temperature 99.4, blood pressure 110/50 but the lowest was 76/32, pulse ranging from 112 to 137, respirations 29, sat 92% on 5 liters. GENERAL:  This is an obese white male, in mild distress secondary to shortness of breath and confusion with agitation. HEENT:  He is normocephalic, atraumatic.  Pupils equal, round, and reactive to light.  Extraocular muscles are intact.  Mucous membranes are moist. NECK:  Supple.  There is no JVD, no lymph nodes, no carotid bruits. CHEST:  Decreased with mild wheezing and coarse rhonchi at the bases bilaterally. CARDIOVASCULAR:  Tachycardic, positive S1 and S2. No S3, S4.  No murmurs, rubs, or gallops. ABDOMEN:  Obese, soft, nontender, nondistended with positive bowel sounds. EXTREMITIES:  No clubbing, cyanosis.  He does have trace edema. NEUROLOGIC:  He is very confused and very hard of hearing.  He does have a hearing aid in the right ear.  PERTINENT LABORATORIES:  Sodium 144, potassium 5.25, chloride is 109, CO2 is 30, BUN 97, creatinine 2.20, glucose of 104, calcium is 8.2.  His albumin is 1.9.  White count 9.2, hemoglobin 9.8, hematocrit 31.2, and platelets 206.  MRSA screen is negative.  UA is negative.  X-ray shows upper right and lower right infiltrate with mild  vascular congestion.  ASSESSMENT AND PLAN:  This is an 77 year old white male with multilobar pneumonia, coronary artery disease, congestive heart failure, possible dysphagia, chronic obstructive pulmonary disease, and delirium.  Wife is able to recognize that she does not want him to suffer over time.  She was able to speak with her two sons who favored do not resuscitate status overall but full care.  Together we decided no chest compressions, no shocks, no vent but treat with medications at least or at least consider treatment if it will stabilize him.  We also talked about trialing bilevel positive airway pressure to transition him to comfort if that came to that point. 1. Limited code.  No cardiopulmonary resuscitation.  Consider     antiarrhythmics and vasopressors if necessary to stabilize     condition.  Bilevel positive airway pressure temporarily to help     transition to comfort if he gets into worsening respiratory     distress.  Family understands that bilevel positive airway pressure     is not long-term. 2. Dysphagia.  Speech evaluation to determine if he is aspirating.  I     did discuss the best texture is that we progress on to full comfort     measures. 3. Dyspnea.  We will discontinue scopolamine which could promote     mucous plugging and his atropine which still could promote plugging     but is shorter acting. 4. Agitation and delirium.  I agree with Haldol as needed.  Total time with this patient was from 3:00 p.m. to 5:54 p.m. and greater than 50% of this time was in counseling and coordination of care.     Doreatha Offer L. Ladona Ridgel, MD     MLT/MEDQ  D:  12/25/2010  T:  12/25/2010  Job:  [id number]  cc:   Lonia Blood, M.D. Cecille Aver, M.D. Noralyn Pick. Eden Emms, MD, [facility]. Milinda Antis, MD  Electronically Signed by Derenda Mis MD on 01/02/2011 01:09:07 PM

## 2011-01-02 NOTE — Progress Notes (Signed)
NAME:  Bill Walker, Bill Walker NO.:  [id number]  MEDICAL RECORD NO.:  [id number]           PATIENT TYPE:  I  LOCATION:  2315                         FACILITY:  [facility]  PHYSICIAN:  Zannie Cove, MD     DATE OF BIRTH:  06/15/1928                                PROGRESS NOTE   ADMITTING PHYSICIAN: Vania Rea, MD, [facility].  PRIMARY CARE PHYSICIAN: Marne A. Tower, MD.  PRIMARY CARDIOLOGIST: Noralyn Pick. Eden Emms, MD, [facility].  ATTENDING PHYSICIAN: Zannie Cove, MD with [facility].  CONSULTANTS THIS ADMISSION: Dr. Katharina Caper. Ladona Ridgel, MD with Palliative Care Medicine and Cecille Aver, MD with Nephrology Services.  CHIEF COMPLAINT/REASON FOR ADMISSION: The patient is an 77 year old male patient with multiple medical problems, primarily coronary artery disease, and COPD.  He was treated as an outpatient for about 10 days as primary care physician for what was felt to be bronchitis, was given Zithromax, but did not improve. He presented with extreme lethargy, worsening cough, confusion, and hallucinations.  His wife actually took him to the primary care physician's office on the date of admission and he was subsequently taken to the Emergency Department.  The wife felt the patient was dehydrated.  She apparently had not given him his Lasix and his Cozaar over the past 2 days because of this concern.  He had not been having any apparent fever or chest pains.  He has been having white productive cough.  No vomiting or diarrhea.  In the Emergency Department, Dr. Orvan Falconer found a very lethargic, confused, obese, elderly male gentleman, lying on a stretcher.  He was afebrile.  He was tachycardic with a heart rate of 117, BP was low at 94/60.  His respirations were 24.  He was maintaining O2 saturations 100% on 6 L nasal cannula oxygen. He normally is O2 dependent on 2 L of nasal cannula oxygen at home.  In the physician's office on 2 L, his O2  saturations were 80%.  Other physical exam findings included coarse crackles in the right base with diffuse mild rhonchi, tachycardia as previously noted.  He had no peripheral edema and although he was lethargic, his cranial nerves were intact and he had no focal or lateralizing signs.  His white count was elevated at 19,600, hemoglobin 1.3, platelets 225,000, neutrophils 94%, absolute neutrophil count was elevated at 18.5.  Sodium was 134, potassium 5.2, chloride 92, CO2 of 28, glucose 142, BUN was markedly elevated at 143, creatinine was elevated at 4.59.  Lactic acid 1.4.  PAST MEDICAL HISTORY: 1. Known CAD status post MI and prior PTCA. 2. Dyslipidemia. 3. Vitamin B12 deficiency on monthly injections. 4. History of thrombocytopenia. 5. CO2 dependent, COPD with cor pulmonale. 6. Remote history of alcohol and tobacco abuse. 7. Chronic kidney disease, baseline creatinine 1.4. 8. Osteoarthritis and gout. 9. Diverticulosis. 10.Chronic anemia. 11.Prior bronchiectasis. 12.Systolic heart failure, ejection fraction 45% followed by [facility].  ADMITTING DIAGNOSES: 1. Acute respiratory failure, secondary to community-acquired     pneumonia, failed outpatient therapy. 2. Acute renal failure, presumed secondary to dehydration in the     setting of baseline meds of Lasix  and Cozaar. 3. Altered mentation secondary to uremia and azotemia. 4. Coronary artery disease, currently quiescent and asymptomatic. 5. Systemic inflammatory response syndrome. 6. Known systolic heart failure, EF 45% appears to be compensated at     present time. 7. Chronic anemia with B12 deficiency, on injections, hemoglobin     stable.  DIAGNOSTICS: 1. Portable chest x-ray, December 22, 2010, shows right upper lobe     airspace opacity likely due to pneumonia. 2. Portable chest x-ray, December 25, 2010, shows worsening asymmetric     bibasilar airspace disease, left greater than right with a small      right pleural effusion. 3. Portable chest x-ray, December 29, 2010, that shows no interval     change from previous x-rays.  LABORATORY: 1. Hepatic function panel was obtained at admission with only mildly     elevated AST at 40, MRSA PCR screening was negative.  Urinalysis     was negative.  Cardiac isoenzymes were cycled this admission     beginning, December 25, 2010, x3 sets and showed no evidence of     ischemia only a mild bump in troponin at 0.08 to 0.09.  Blood     cultures x2 sets have been obtained this admission and showed no     growth.  TSH is normal at 2.070 and anemia panel was obtained this     admission, iron was 52, TIBC 175%, sat 30, UIBC 123, vitamin B12 is     1884, serum folate 8.1, ferritin 1040. 2. BNP for today December 30, 2010 is mildly elevated at 138.  BMET     for December 30, 2010, is pending, but BMET 24 hours prior, sodium     161, potassium 4.6, chloride 117, CO2 of 37, glucose 152, BUN 67,     creatinine 1.65.  CBC from December 29, 2010, white count 5200,     hemoglobin 11.6, hematocrit 38.5, platelet count 167,000.  HOSPITAL COURSE: 1. Acute hypoxemic respiratory failure with presumed failed outpatient     treatment for community-acquired pneumonia.  The patient was     admitted to the [facility] ICU for closed monitoring and initially     for BiPAP therapy.  Eventually has been weaned down to 2 L nasal     cannula oxygen.  The patient presented with right-sided x-ray     changes consistent with pneumonia.  Initially felt to be related to     community-acquired pneumonia.  Later, there was some concerns the     patient that may be actually experiencing aspiration pneumonitis,     especially in the setting of acute altered mentation due to azotemia.     Swallowing evaluation revealed that the patient had very severe     dysphagia and appeared to be at risk with all consistencies.  One     consistency was not felt to be safer than the other.  This  was     discussed with the patient's wife and he was placed on a pureed     diet.  The patient is silently aspirating and has no evidence of     coughing or choking with eating.  At presentation, the patient had     been placed on broad-spectrum antibiotic therapy with vancomycin     and aztreonam, but in the setting of acute renal failure, these     medications were discontinued in favor of Avelox to cover more     atypical organisms.  The patient  did initially received 5 days of     therapy for this and this was discontinued, but with further     clarification of the patient actually having aspiration pneumonitis     and after discussion with the family, it was opted to resume the     Avelox for an additional 5 days of therapy to have a total course     of treatment for 10 days.  Avelox was resumed on December 29, 2010. 2. Atrial fibrillation with rapid ventricular response.  Into the     course of hospitalization, the patient developed atrial     fibrillation with rapid ventricular response.  There is no prior     history of atrial fibrillation, although he does have a history of     coronary artery disease.  This onset occurred within 12 hours after     the patient gegan to diurese after a one-time dose of Lasix 80 mg IV.  The     patient was started on a Cardizem drip.  Initial attempt was made     to wean the drip off, but he developed recurrent RVR with rates up     into the 170s.  This required multiple dosing of IV Lopressor,     resumption of IV Cardizem, a dose of digoxin, and a one-time dose     of amiodarone 150 mg IV.  At the present time, the patient is in     sinus rhythm and sinus tach.  He is on p.o. Lopressor and Cardizem.     Consideration should be given to starting low-dose digoxin if rate     control becomes an issue. 3. Acute renal failure on chronic kidney disease.  The patient's     baseline creatinine is 1.4 and he presented of 4.59 with gentle IV     fluid  hydration and holding off the patient's ascending medicines     including his Lasix and his ARB.  The creatinine has trended     downward and as of December 29, 2010, is down to 1.65.  He also     presented with profound azotemia with a BUN of 143.  This has also     slowly trended downward and as of the December 29, 2010, is down to     46.  Nephrology was consulted, but due to the patient's multiple     comorbidities and advanced age and was not deemed an appropriate     dialysis candidate.  Multiple discussions have been held with the     wife and the family and they are aware that in addition to other     multiple problems as previously outlined that his kidney disease     may also be a life-ending situation.  The patient did develop     significant peripheral third-spacing edema during this     hospitalization and once his renal function began to improve, he     was given a one-time dose of Lasix 80 mg IV and diuresed about 37,     100 mL out.  Please note that during this time, although he was     volume overloaded, he was not experiencing any signs or symptoms of     heart failure exacerbation.  In addition to the acute renal     failure, the patient has developed progressive hypernatremia with     an elevated CO2 level consistent with traction alkalosis and we are  currently gently hydrating him with D5W 50 an hour.  A BMET for     December 30, 2010, shows sodium increased to 166 so the IV rate was      increased to 100cc/hr. 4. Hyperkalemia.  The patient developed elevated potassium as high as     of 6.2 during the acute renal failure phase.  This initially was     recalcitrant to p.o. Kayexalate therapy, but after several days of     treatment with Kayexalate and eventually changing to per rectal route.      Lasix was administered along with a dose of D50 with 10     units of insulin and calcium gluconate for cardiac protection. The     patient's potassium has normalized and  has remained in the normal     ranges for the past 72 hours. 5. Normocytic anemia and B12 deficiency.  The patient's hemoglobin has     remained stable this hospitalization.  He normally receives B12     injections monthly, a dose was given this hospitalization on     December 28, 2010. 6. Hypertension.  At the present time, the patient's blood pressure is     controlled.  He was on ARB class medicine before admission and this     has not been resumed.  Due to his previously mentioned atrial     fibrillation with RVR, he has been placed on Lopressor and     Cardizem, which are also assisting with blood pressure control.  He     also has a history of CAD and his usual Imdur is in place and this     is also helping keep the blood pressure well controlled. 7. Known coronary artery disease.  The patient remains on aspirin and     Plavix.  He has not had any typical signs and symptoms of cardiac     ischemic event.  His EKG has remained stable.  DISPOSITION: Due to the patient's multiple comorbidities and lack of improvement, especially in regards to acute on chronic renal disease, Palliative Care Medicine was asked to evaluate the patient.  The initial meeting per Dr. Ladona Ridgel with the family took over 3 hours.  They have met with the family daily.  Goals of care have been addressed.  Subsequently since that time it has been confirmed that the patient has silent aspiration. Given the fact he has O2 dependent COPD, recurrent aspiration pneumonitis symptoms with severe dysphagia, and chronic kidney disease, not deemed a candidate for dialysis as well as longstanding coronary artery disease previously deemed not a candidate for further cardiac interventions the patient has been made a DNR this admission.  Multiple discussions have been held with the family regarding progressing to comfort care, but they are transitioning in this direction slowly.  As previously mentioned, they wished to  continue antibiotic therapy to treat any possible aspiration pneumonitis and this has been done. At the present time, they are okay with transitioning the patient to the 4500 units.  Dr. Ladona Ridgel recommends that at time of discharge he discharges to a Residential Hospice Facility as opposed to a Skilled Nursing Facility. Family is also in agreement with this plan.  As of today, the patient has been off IV Cardizem for greater than 24 hours and is deemed appropriate to transfer to 4500 Palliative Care Unit today.     Allison L. Rennis Harding, N.P.   ______________________________ Zannie Cove, MD    ALE/MEDQ  D:  12/30/2010  T:  12/30/2010  Job:  [id number]  Electronically Signed by Junious Silk N.P. on 12/30/2010 03:29:44 PM Electronically Signed by Zannie Cove  on 01/02/2011 03:57:39 PM

## 2011-01-03 ENCOUNTER — Telehealth: Payer: Self-pay | Admitting: Family Medicine

## 2011-01-03 ENCOUNTER — Ambulatory Visit: Payer: Self-pay | Admitting: Cardiovascular Disease

## 2011-01-03 LAB — BASIC METABOLIC PANEL
BUN: 19 mg/dL (ref 6–23)
CO2: 33 mEq/L — ABNORMAL HIGH (ref 19–32)
Calcium: 8.1 mg/dL — ABNORMAL LOW (ref 8.4–10.5)
Creatinine, Ser: 1.41 mg/dL (ref 0.4–1.5)
GFR calc Af Amer: 58 mL/min — ABNORMAL LOW (ref >60)

## 2011-01-04 NOTE — Consult Note (Signed)
NAMEFRANSISCO, Walker NO.:  [id number]  MEDICAL RECORD NO.:  [id number]           PATIENT TYPE:  LOCATION:                                 FACILITY:  PHYSICIAN:  Cecille Aver, M.D.DATE OF BIRTH:  Mar 03, 1928  DATE OF CONSULTATION: DATE OF DISCHARGE:                                CONSULTATION   REQUESTING PHYSICIAN:  Vania Rea, MD  REASON FOR CONSULTATION:  Acute on chronic renal failure.  HISTORY OF PRESENT ILLNESS:  Mr. Bill Walker is an 77 year old white male with past medical history significant for COPD, hypertension, and coronary artery disease as well as CKD with baseline creatinine of 1.4 in August 2011.  The patient apparently was treated for bronchitis as an outpatient with the Z-Pak recently, but presented with cough, lethargy, and confusion to the emergency department last night.  Of note, the patient was on Cozaar and had low blood pressure upon presentation. Initial BUN and creatinine were 143 and 4.59 and this morning 126 and 3.1.  We are asked to assist with acute renal failure.  Currently, the patient is having great urine output.  He is confused and requiring restraints.  He has been diagnosed with a right-sided pneumonia.  PAST MEDICAL HISTORY: 1. COPD. 2. Coronary artery disease, status post MI with an ejection fraction     of 45%. 3. Hyperlipidemia. 4. History of tobacco abuse. 5. Degenerative disk disease. 6. Gout. 7. Diverticulosis.  CURRENT MEDICATIONS: 1. Vancomycin. 2. Avelox. 3. Rocephin. 4. Azithromycin. 5. Imdur 60 mg daily. 6. Zocor 20 mg daily. 7. Aspirin 325 mg daily. 8. Plavix 75 mg daily. 9. Lovenox daily. 10.Allopurinol 100 mg daily.  ALLERGIES: 1. PENICILLIN. 2. CARDURA.  SOCIAL HISTORY:  From the chart.  The patient is retired and lives in [place] with his wife.  He has previous tobacco use/abuse, but quit in 1998.  History of alcohol abuse as well, but no current alcohol use.  FAMILY  HISTORY:  Unable to obtain secondary to confusion and hard-of- hearing.  REVIEW OF SYSTEMS:  Unable to obtain secondary to confusion and hard-of- hearing.  PHYSICAL EXAMINATION:  VITAL SIGNS:  T-max is 99.6, blood pressure 110/52, heart rate 120, respirations 29, and oxygen saturation is 94% on 5 liters of O2 nasal cannula.  Today's Is and Os so far is 2080 and then 2010 out of output.  Urine output has been greater than 100 mL an hour. GENERAL:  The patient is confused and somnolent.  He is very hard-of- hearing. HEENT:  Pupils are equal, round, and reactive to light.  Extraocular motions are intact.  Mucous membranes are moist. NECK:  There is no jugular venous distention. LUNGS:  Poor effort bilaterally with decreased breath sounds at the bases. CARDIOVASCULAR:  Tachycardic. ABDOMEN:  Soft, nontender, and nondistended. EXTREMITIES:  No peripheral or dependent edema as of yet. NEURO:  The patient is confused and somnolent.  LABORATORY DATA:  Sodium 141, potassium 4.7, BUN and creatinine 126 and 3.1 which is an improvement from 143 and 4.59 last night.  Urinalysis is cloudy, but otherwise not that remarkable with rare bacteria and no blood.  White  blood count 12.7, hemoglobin 10.1, and platelet count 215. Chest x-ray shows right-sided airspace disease with mild pulmonary vascular congestion.  ASSESSMENT:  This is an 77 year old white male with acute on chronic renal failure in the setting of pneumonia, hypotension, on an angiotensin receptor blocker. 1. Renal:  Acute on chronic renal failure, most likely secondary to     hypotension, on ARB, related to pneumonia.  Now the blood pressures     improved having great urine output and BUN and creatinine are     decreasing nicely.  I will continue with IV fluids and continue to     hold ARB for now. 2. Infectious disease, pneumonia right sided, questionable aspiration:     He is on multiple antibiotics per Primary Team.  Does have a  low-     grade temp and an elevated white blood count. 3. Anemia, most likely situational:  We will follow for now and     transfuse as needed.  Thank you for consultation.  We will follow with you.          ______________________________ Cecille Aver, M.D.     KAG/MEDQ  D:  12/23/2010  T:  12/24/2010  Job:  [id number]  Electronically Signed by Annie Sable M.D. on 01/04/2011 08:12:26 PM

## 2011-01-05 ENCOUNTER — Encounter: Payer: Self-pay | Admitting: Family Medicine

## 2011-01-06 ENCOUNTER — Encounter: Payer: Self-pay | Admitting: Family Medicine

## 2011-01-09 NOTE — Discharge Summary (Signed)
NAMECONSUELO, Bill Walker              ACCOUNT NO.:  [id number]  MEDICAL RECORD NO.:  [id number]           PATIENT TYPE:  I  LOCATION:  4502                         FACILITY:  [facility]  PHYSICIAN:  Hartley Barefoot, MD    DATE OF BIRTH:  1928-05-08  DATE OF ADMISSION:  12/22/2010 DATE OF DISCHARGE:                        DISCHARGE SUMMARY - REFERRING   DISCHARGE DATE:  January 04, 2011.  DISCHARGE DIAGNOSES: 1. Acute hypoxemic respiratory failure secondary to community-acquired     pneumonia and frequent aspiration pneumonitis, stable. 2. Atrial fibrillation, rapid ventricular response. 3. Acute-on-chronic renal failure. 4. Hyperkalemia. 5. Normocytic anemia and B12 deficiency. 6. Hypertension. 7. Coronary artery disease. 8. Other past medical history of known coronary artery disease, status     post myocardial infarction and prior percutaneous transluminal     coronary angioplasty. 9. Dyslipidemia. 10.History of thrombocytopenia.11.Chronic obstructive pulmonary disease with cor pulmonale, oxygen     dependent. 12 . Remote history of alcohol and tobacco abuse. 1. Osteoarthritis. 2. Diverticulosis. 3. Chronic anemia. 4. Heart failure.  DISCHARGE MEDICATIONS: 1. Diltiazem 60 mg p.o. every 6 hours. 2. Lorazepam 2 mg IV every 6 hours as needed. 3. Metoprolol 25 mg p.o. b.i.d. 4. Imdur 30 mg 2 tablet by mouth daily. 5. Plavix 75 mg p.o. daily. 6. Spiriva 18 mcg 1 capsule inhaled daily. 7. Albuterol 90 mcg 1-2 puffs inhaled every 4 hours as needed.  MEDICATIONS STOPPED DURING THIS HOSPITALIZATION:  Furosemide, Cozaar, Zocor, aspirin, allopurinol, nitroglycerin, and Zithromax.  DISPOSITION AND FOLLOWUP:  [name] will be transferred to [facility].  HISTORY OF PRESENT ILLNESS:  This 77 year old with multiple medical problems, COPD, cor pulmonale, and heart failure who presents after failing outpatient therapy for bronchitis with Zithromax.  He presented with extreme  lethargy, worsening cough, confusion, and hallucination.  He has been having cough.  When the patient arrived to the Emergency Department, he was tachycardiac in the 117, blood pressure in the 90s, respiration was 24, oxygen saturation 100% on 6 L.  ADMISSION LABORATORY DATA-:  Platelets 225, white blood cell 19.  CO2 of 28, glucose 142, BUN at 143, creatinine 4.5, lactic acid 1.5.  HOSPITAL COURSE: 1. Acute respiratory failure secondary to pneumonia and aspiration     pneumonitis.  The patient initially admitted to the ICU.  His     respiratory status has stabilized 2. Acute renal failure secondary to dehydration, prerenal in the     setting of dehydration, Lasix, and Cozaar use.  With IV fluids, the     patient's renal function has improved and has remained stable. 3. Hypernatremia secondary to dehydration and poor oral intake.  This     was treated with IV fluids D5 however in the course of     hospitalization, this has improved.  The patient has poor oral     intake and he does not want to eat that much.  It is very likely     that his hypernatremia can reoccur. 4. Metabolic encephalopathic secondary to uremia and azotemia, this     has improved.  The patient is now more awake and able to  communicate and more coherent.  5. Systolic heart failure with ejection fraction of 45%.  The     patient's hospital course has complicated with worsening pulmonary     edema.  He received IV Lasix and his respiratory status improved.     At this time, he is off of diuretic and his heart failure is     compensated. 6. AFib with rapid ventricular rate.  Into the course of     hospitalization the patient was in of AFib with RPR.  The patient     was restarted on Cardizem drip and he was placed on oral Cardizem     and then he also required multiple doses of IV Lopressor and also     received one dose of amiodarone.  His heart rate has been     controlled with metoprolol and Cardizem. 7.  Hyperkalemia secondary to acute renal failure.  This has resolved.  DISPOSITION:  The patient will be transferred to be [facility] due to multiple medical comorbidities in the setting of acute-on-chronic renal failure, heart failure, COPD oxygen-dependent, dementia, and poor oral intake.  The patient will be transferred to be [facility].  He will be followed by hospice and palliative care.  At this time, he will just going to continue with his medications for AFib and heart failure. The patient was discharged in stable condition.     Hartley Barefoot, MD     BR/MEDQ  D:  01/03/2011  T:  01/03/2011  Job:  [id number]  Electronically Signed by Hartley Barefoot MD on 01/09/2011 10:28:25 AM

## 2011-01-10 NOTE — Progress Notes (Signed)
Summary: being admitted to hospice   Phone Note Call from Patient   Caller: [facility] - [phone]  Latanya Maudlin Call For: [name] MD Summary of Call: Patient is being admitted to hospice tomorrow and Hospice nurse is asking if you are going to be the attending physician. Please advise.  Initial call taken by: Melody Comas,  January 03, 2011 3:48 PM  Follow-up for Phone Call        if there is a hospice physician that would be a more optimal situation re: the knowlege base for comfort care- but If not , keep me on as primary Follow-up by: [name] MD,  January 03, 2011 4:36 PM  Additional Follow-up for Phone Call Additional follow up Details #1::        Left message at [facility] for Hospice nurse to call back. Sydell Axon LPN  January 03, 2011 4:45 PM  Nurse states hospice doctors with do symptomatic care, you will be listed as MD of record, you will need to sign death certifiicate when pt passes. Additional Follow-up by: Lowella Petties CMA, AAMA,  January 03, 2011 4:51 PM    Additional Follow-up for Phone Call Additional follow up Details #2::    that is fine - thanks for the update Follow-up by: [name] MD,  January 03, 2011 4:56 PM

## 2011-01-11 ENCOUNTER — Encounter: Payer: Self-pay | Admitting: Family Medicine

## 2011-01-12 ENCOUNTER — Encounter: Payer: Self-pay | Admitting: Family Medicine

## 2011-01-16 ENCOUNTER — Encounter: Payer: Self-pay | Admitting: Family Medicine

## 2011-01-19 NOTE — Miscellaneous (Signed)
Summary: Admission Orders/[facility]  Admission Orders/[facility]   Imported By: Maryln Gottron 01/09/2011 15:30:24  _____________________________________________________________________  External Attachment:    Type:   Image     Comment:   External Document

## 2011-01-19 NOTE — Miscellaneous (Signed)
Summary: Clinical Update/[facility]  Clinical Update/[facility]   Imported By: Maryln Gottron 01/11/2011 10:49:00  _____________________________________________________________________  External Attachment:    Type:   Image     Comment:   External Document

## 2011-01-24 NOTE — Miscellaneous (Signed)
Summary: [facility]   Imported By: Kassie Mends 01/16/2011 10:32:37  _____________________________________________________________________  External Attachment:    Type:   Image     Comment:   External Document

## 2011-01-24 NOTE — Miscellaneous (Signed)
Summary: Physician's Orders/[facility]  Physician's Orders/[facility]   Imported By: Maryln Gottron 01/18/2011 15:39:36  _____________________________________________________________________  External Attachment:    Type:   Image     Comment:   External Document

## 2011-01-30 ENCOUNTER — Encounter: Payer: Self-pay | Admitting: Family Medicine

## 2011-01-30 ENCOUNTER — Ambulatory Visit (INDEPENDENT_AMBULATORY_CARE_PROVIDER_SITE_OTHER): Payer: Medicare Other

## 2011-01-30 DIAGNOSIS — E538 Deficiency of other specified B group vitamins: Secondary | ICD-10-CM

## 2011-01-31 ENCOUNTER — Ambulatory Visit: Payer: Self-pay

## 2011-01-31 LAB — LIPASE, BLOOD: Lipase: 25 U/L (ref 11–59)

## 2011-01-31 LAB — CARDIAC PANEL(CRET KIN+CKTOT+MB+TROPI)
CK, MB: 3 ng/mL (ref 0.3–4.0)
Relative Index: INVALID (ref 0.0–2.5)
Relative Index: INVALID (ref 0.0–2.5)
Total CK: 48 U/L (ref 7–232)
Troponin I: 0.02 ng/mL (ref 0.00–0.06)

## 2011-01-31 LAB — POCT I-STAT, CHEM 8
Creatinine, Ser: 1.4 mg/dL (ref 0.4–1.5)
HCT: 38 % — ABNORMAL LOW (ref 39.0–52.0)
Hemoglobin: 12.9 g/dL — ABNORMAL LOW (ref 13.0–17.0)
Potassium: 4.3 mEq/L (ref 3.5–5.1)
Sodium: 137 mEq/L (ref 135–145)

## 2011-01-31 LAB — CBC
HCT: 32.6 % — ABNORMAL LOW (ref 39.0–52.0)
HCT: 32.8 % — ABNORMAL LOW (ref 39.0–52.0)
MCHC: 33.3 g/dL (ref 30.0–36.0)
MCHC: 33.7 g/dL (ref 30.0–36.0)
MCV: 96.7 fL (ref 78.0–100.0)
Platelets: 85 10*3/uL — ABNORMAL LOW (ref 150–400)
Platelets: 98 10*3/uL — ABNORMAL LOW (ref 150–400)
RBC: 3.37 MIL/uL — ABNORMAL LOW (ref 4.22–5.81)
RDW: 15.7 % — ABNORMAL HIGH (ref 11.5–15.5)
RDW: 15.8 % — ABNORMAL HIGH (ref 11.5–15.5)

## 2011-01-31 LAB — CK TOTAL AND CKMB (NOT AT ARMC): Total CK: 59 U/L (ref 7–232)

## 2011-01-31 LAB — POCT CARDIAC MARKERS
CKMB, poc: 2.4 ng/mL (ref 1.0–8.0)
Myoglobin, poc: 191 ng/mL (ref 12–200)

## 2011-01-31 LAB — BASIC METABOLIC PANEL
CO2: 35 mEq/L — ABNORMAL HIGH (ref 19–32)
CO2: 37 mEq/L — ABNORMAL HIGH (ref 19–32)
Calcium: 8.1 mg/dL — ABNORMAL LOW (ref 8.4–10.5)
Calcium: 8.4 mg/dL (ref 8.4–10.5)
Chloride: 100 mEq/L (ref 96–112)
Chloride: 98 mEq/L (ref 96–112)
Creatinine, Ser: 1.38 mg/dL (ref 0.4–1.5)
Creatinine, Ser: 1.43 mg/dL (ref 0.4–1.5)
Glucose, Bld: 100 mg/dL — ABNORMAL HIGH (ref 70–99)
Glucose, Bld: 102 mg/dL — ABNORMAL HIGH (ref 70–99)

## 2011-01-31 LAB — DIFFERENTIAL
Basophils Absolute: 0 10*3/uL (ref 0.0–0.1)
Basophils Relative: 0 % (ref 0–1)
Monocytes Absolute: 0.5 10*3/uL (ref 0.1–1.0)
Neutro Abs: 6.6 10*3/uL (ref 1.7–7.7)
Neutrophils Relative %: 80 % — ABNORMAL HIGH (ref 43–77)

## 2011-01-31 LAB — PROTIME-INR: Prothrombin Time: 13.4 seconds (ref 11.6–15.2)

## 2011-01-31 LAB — IRON AND TIBC
Saturation Ratios: 29 % (ref 20–55)
UIBC: 158 ug/dL

## 2011-01-31 LAB — HEPATIC FUNCTION PANEL
AST: 58 U/L — ABNORMAL HIGH (ref 0–37)
Albumin: 3.3 g/dL — ABNORMAL LOW (ref 3.5–5.2)
Bilirubin, Direct: 0.2 mg/dL (ref 0.0–0.3)

## 2011-01-31 NOTE — Miscellaneous (Signed)
Summary: [facility]   Imported By: Kassie Mends 01/25/2011 08:46:15  _____________________________________________________________________  External Attachment:    Type:   Image     Comment:   External Document

## 2011-02-03 ENCOUNTER — Encounter: Payer: Self-pay | Admitting: Cardiovascular Disease

## 2011-02-09 NOTE — Assessment & Plan Note (Signed)
Summary: B-12//KAD  Nurse Visit   Allergies: 1)  Penicillin 2)  Cardura  Medication Administration  Injection # 1:    Medication: Vit B12 1000 mcg    Diagnosis: VITAMIN B12 DEFICIENCY (ICD-266.2)    Route: IM    Site: L deltoid    Exp Date: 08/13/2012    Lot #: [id number]    Mfr: American Regent    Patient tolerated injection without complications    Given by: Sydell Axon LPN (January 30, 2011 9:00 AM)  Orders Added: 1)  Vit B12 1000 mcg [J3420] 2)  Admin of Therapeutic Inj  intramuscular or subcutaneous [[id number]]   Medication Administration  Injection # 1:    Medication: Vit B12 1000 mcg    Diagnosis: VITAMIN B12 DEFICIENCY (ICD-266.2)    Route: IM    Site: L deltoid    Exp Date: 08/13/2012    Lot #: [id number]    Mfr: American Regent    Patient tolerated injection without complications    Given by: Sydell Axon LPN (January 30, 2011 9:00 AM)  Orders Added: 1)  Vit B12 1000 mcg [J3420] 2)  Admin of Therapeutic Inj  intramuscular or subcutaneous [[id number]]

## 2011-02-15 ENCOUNTER — Ambulatory Visit (INDEPENDENT_AMBULATORY_CARE_PROVIDER_SITE_OTHER): Payer: Medicare Other | Admitting: Cardiovascular Disease

## 2011-02-15 DIAGNOSIS — I2510 Atherosclerotic heart disease of native coronary artery without angina pectoris: Secondary | ICD-10-CM

## 2011-02-15 DIAGNOSIS — J449 Chronic obstructive pulmonary disease, unspecified: Secondary | ICD-10-CM

## 2011-02-15 DIAGNOSIS — M109 Gout, unspecified: Secondary | ICD-10-CM

## 2011-02-15 DIAGNOSIS — I10 Essential (primary) hypertension: Secondary | ICD-10-CM

## 2011-02-15 DIAGNOSIS — E785 Hyperlipidemia, unspecified: Secondary | ICD-10-CM

## 2011-02-15 NOTE — Progress Notes (Signed)
Bill Walker is seen today post hospital D/C.  He was cathed by Dr Riley Kill 5/11  and had 3VD.  I reviewed his cath films and agree that his lesions are not amenable to PCI and he is not a CABG candidate due to his age and advanced oxygen dependant COPD. Just D/C from hospital with respitory failure and pneumonia 2/12  Hospice involved with care Since D/C he has not had SSCP  His activity is limited by dypnea.  The heat in particular bothers him.  He has been compliant with his meds and had is sl nitro with him today. He has had some increasing LE edema.  His wife has taken the salt shaker away.  He has not needed any nitro.    ROS: Denies fever, malais, weight loss, blurry vision, decreased visual acuity, cough, sputum, SOB, hemoptysis, pleuritic pain, palpitaitons, heartburn, abdominal pain, melena, lower extremity edema, claudication, or rash.   General: Affect appropriate Chronically ill with oxgyen on  HEENT: normal Neck supple with no adenopathy JVP normal no bruits no thyromegaly Lungs clear with no wheezing and good diaphragmatic motion Heart:  S1/S2 no murmur,rub, gallop or click PMI normal Abdomen: benighn, BS positve, no tenderness, no AAA no bruit.  No HSM or HJR Distal pulses intact with no bruits No edema Neuro non-focal Skin warm and dry No muscular weakness   Current Outpatient Prescriptions  Medication Sig Dispense Refill  . albuterol (VENTOLIN HFA) 108 (90 BASE) MCG/ACT inhaler Inhale 2 puffs into the lungs every 6 (six) hours as needed.        Marland Kitchen allopurinol (ZYLOPRIM) 300 MG tablet Take 300 mg by mouth daily.        Marland Kitchen aspirin 325 MG tablet Take 325 mg by mouth daily.        Marland Kitchen azithromycin (ZITHROMAX) 250 MG tablet Take 2 tablets by mouth on day 1, followed by 1 tablet by mouth daily for 4 days. Use as directed       . clopidogrel (PLAVIX) 75 MG tablet Take 75 mg by mouth daily.        . Cyanocobalamin (VITAMIN B-12 IJ) Inject as directed. monthly       . furosemide  (LASIX) 80 MG tablet Take 80 mg by mouth. 1 po daily and 1 and 1/2 on Mon, Wed, and Fri       . isosorbide mononitrate (IMDUR) 30 MG 24 hr tablet 30 mg. 2 po daily       . losartan (COZAAR) 50 MG tablet Take 50 mg by mouth 2 (two) times daily.        . metoprolol (TOPROL-XL) 100 MG 24 hr tablet Take 100 mg by mouth daily.        . nitroGLYCERIN (NITROSTAT) 0.4 MG SL tablet Place 0.4 mg under the tongue every 5 (five) minutes as needed.        . NON FORMULARY Oxygen- 2 liters every 24 hours       . simvastatin (ZOCOR) 20 MG tablet Take 20 mg by mouth at bedtime.        Marland Kitchen tiotropium (SPIRIVA) 18 MCG inhalation capsule Place 18 mcg into inhaler and inhale daily.          Allergies  Doxazosin mesylate and Penicillins  Electrocardiogram:  Assessment and Plan

## 2011-02-15 NOTE — Assessment & Plan Note (Signed)
Lab Results  Component Value Date   LDLCALC 45 02/02/2009  At goal with no side effect

## 2011-02-15 NOTE — Assessment & Plan Note (Signed)
Well controlled.  Continue current medications and low sodium Dash type diet.    

## 2011-02-15 NOTE — Patient Instructions (Addendum)
Schedule F/U with Dr. Eden Emms in 6 months.please discontinue cardiazem

## 2011-02-15 NOTE — Assessment & Plan Note (Signed)
3VD not amenable to intervention.  Medical Rx in light of hospice involvement with COPD

## 2011-02-15 NOTE — Assessment & Plan Note (Signed)
ON continuous oxygen.  Advanced directives discussed.  Hospice involved

## 2011-02-18 LAB — CARDIAC PANEL(CRET KIN+CKTOT+MB+TROPI)
CK, MB: 2.5 ng/mL (ref 0.3–4.0)
CK, MB: 2.7 ng/mL (ref 0.3–4.0)
Relative Index: INVALID (ref 0.0–2.5)
Total CK: 38 U/L (ref 7–232)
Total CK: 49 U/L (ref 7–232)
Total CK: 60 U/L (ref 7–232)

## 2011-02-18 LAB — LIPID PANEL
Cholesterol: 92 mg/dL (ref 0–200)
HDL: 32 mg/dL — ABNORMAL LOW (ref >39)
LDL Cholesterol: 47 mg/dL (ref 0–99)
Total CHOL/HDL Ratio: 2.9 RATIO
VLDL: 13 mg/dL (ref 0–40)

## 2011-02-18 LAB — DIFFERENTIAL
Basophils Absolute: 0 10*3/uL (ref 0.0–0.1)
Basophils Relative: 0 % (ref 0–1)
Eosinophils Absolute: 0.1 10*3/uL (ref 0.0–0.7)
Eosinophils Relative: 2 % (ref 0–5)
Lymphocytes Relative: 14 % (ref 12–46)
Lymphs Abs: 1.1 10*3/uL (ref 0.7–4.0)
Lymphs Abs: 1.2 10*3/uL (ref 0.7–4.0)
Monocytes Absolute: 0.6 10*3/uL (ref 0.1–1.0)
Monocytes Relative: 7 % (ref 3–12)
Neutrophils Relative %: 75 % (ref 43–77)

## 2011-02-18 LAB — POCT CARDIAC MARKERS
Myoglobin, poc: 151 ng/mL (ref 12–200)
Troponin i, poc: <0.05 ng/mL (ref 0.00–0.09)
Troponin i, poc: <0.05 ng/mL (ref 0.00–0.09)

## 2011-02-18 LAB — CBC
HCT: 32.1 % — ABNORMAL LOW (ref 39.0–52.0)
HCT: 32.3 % — ABNORMAL LOW (ref 39.0–52.0)
HCT: 32.4 % — ABNORMAL LOW (ref 39.0–52.0)
HCT: 34.4 % — ABNORMAL LOW (ref 39.0–52.0)
HCT: 37.4 % — ABNORMAL LOW (ref 39.0–52.0)
Hemoglobin: 10.9 g/dL — ABNORMAL LOW (ref 13.0–17.0)
Hemoglobin: 11.2 g/dL — ABNORMAL LOW (ref 13.0–17.0)
Hemoglobin: 12.5 g/dL — ABNORMAL LOW (ref 13.0–17.0)
MCHC: 33.9 g/dL (ref 30.0–36.0)
MCV: 95.7 fL (ref 78.0–100.0)
MCV: 97.1 fL (ref 78.0–100.0)
MCV: 97.6 fL (ref 78.0–100.0)
Platelets: 84 10*3/uL — ABNORMAL LOW (ref 150–400)
Platelets: 94 10*3/uL — ABNORMAL LOW (ref 150–400)
Platelets: 97 10*3/uL — ABNORMAL LOW (ref 150–400)
Platelets: 99 10*3/uL — ABNORMAL LOW (ref 150–400)
RBC: 3.32 MIL/uL — ABNORMAL LOW (ref 4.22–5.81)
RBC: 3.41 MIL/uL — ABNORMAL LOW (ref 4.22–5.81)
RBC: 3.54 MIL/uL — ABNORMAL LOW (ref 4.22–5.81)
RBC: 3.88 MIL/uL — ABNORMAL LOW (ref 4.22–5.81)
RDW: 14.8 % (ref 11.5–15.5)
RDW: 15 % (ref 11.5–15.5)
RDW: 15.2 % (ref 11.5–15.5)
RDW: 15.3 % (ref 11.5–15.5)
WBC: 5.6 10*3/uL (ref 4.0–10.5)
WBC: 6.5 10*3/uL (ref 4.0–10.5)
WBC: 6.9 10*3/uL (ref 4.0–10.5)

## 2011-02-18 LAB — BASIC METABOLIC PANEL
BUN: 19 mg/dL (ref 6–23)
BUN: 19 mg/dL (ref 6–23)
BUN: 24 mg/dL — ABNORMAL HIGH (ref 6–23)
Calcium: 8.2 mg/dL — ABNORMAL LOW (ref 8.4–10.5)
Calcium: 8.3 mg/dL — ABNORMAL LOW (ref 8.4–10.5)
Calcium: 8.5 mg/dL (ref 8.4–10.5)
Creatinine, Ser: 1.11 mg/dL (ref 0.4–1.5)
GFR calc Af Amer: >60 mL/min (ref >60)
GFR calc Af Amer: >60 mL/min (ref >60)
GFR calc non Af Amer: 53 mL/min — ABNORMAL LOW (ref >60)
GFR calc non Af Amer: 53 mL/min — ABNORMAL LOW (ref >60)
GFR calc non Af Amer: 57 mL/min — ABNORMAL LOW (ref >60)
GFR calc non Af Amer: >60 mL/min (ref >60)
Glucose, Bld: 103 mg/dL — ABNORMAL HIGH (ref 70–99)
Glucose, Bld: 118 mg/dL — ABNORMAL HIGH (ref 70–99)
Potassium: 4.4 mEq/L (ref 3.5–5.1)
Sodium: 138 mEq/L (ref 135–145)
Sodium: 140 mEq/L (ref 135–145)

## 2011-02-18 LAB — CK TOTAL AND CKMB (NOT AT ARMC)
CK, MB: 2.9 ng/mL (ref 0.3–4.0)
Relative Index: INVALID (ref 0.0–2.5)

## 2011-02-18 LAB — PROTIME-INR
INR: 1 (ref 0.00–1.49)
INR: 1 (ref 0.00–1.49)
Prothrombin Time: 13.2 seconds (ref 11.6–15.2)
Prothrombin Time: 13.2 seconds (ref 11.6–15.2)

## 2011-02-18 LAB — HEPARIN LEVEL (UNFRACTIONATED)
Heparin Unfractionated: 0.51 IU/mL (ref 0.30–0.70)
Heparin Unfractionated: 0.75 IU/mL — ABNORMAL HIGH (ref 0.30–0.70)

## 2011-02-18 LAB — POCT I-STAT, CHEM 8
BUN: 39 mg/dL — ABNORMAL HIGH (ref 6–23)
Calcium, Ion: 1.11 mmol/L — ABNORMAL LOW (ref 1.12–1.32)
Chloride: 100 mEq/L (ref 96–112)
Glucose, Bld: 138 mg/dL — ABNORMAL HIGH (ref 70–99)
HCT: 39 % (ref 39.0–52.0)
Potassium: 4.2 mEq/L (ref 3.5–5.1)

## 2011-02-21 ENCOUNTER — Inpatient Hospital Stay (INDEPENDENT_AMBULATORY_CARE_PROVIDER_SITE_OTHER)
Admission: RE | Admit: 2011-02-21 | Discharge: 2011-02-21 | Disposition: A | Discharge status: Home / Self Care | Payer: Medicare Other | Source: Ambulatory Visit | Attending: Family Medicine | Admitting: Family Medicine

## 2011-02-21 DIAGNOSIS — S8010XA Contusion of unspecified lower leg, initial encounter: Secondary | ICD-10-CM

## 2011-02-21 DIAGNOSIS — S99929A Unspecified injury of unspecified foot, initial encounter: Secondary | ICD-10-CM

## 2011-02-23 ENCOUNTER — Encounter: Payer: Self-pay | Admitting: Cardiovascular Disease

## 2011-03-01 ENCOUNTER — Inpatient Hospital Stay (INDEPENDENT_AMBULATORY_CARE_PROVIDER_SITE_OTHER)
Admission: RE | Admit: 2011-03-01 | Discharge: 2011-03-01 | Disposition: A | Discharge status: Home / Self Care | Payer: Medicare Other | Source: Ambulatory Visit | Attending: Family Medicine | Admitting: Family Medicine

## 2011-03-01 DIAGNOSIS — T148XXA Other injury of unspecified body region, initial encounter: Secondary | ICD-10-CM

## 2011-03-02 ENCOUNTER — Ambulatory Visit (INDEPENDENT_AMBULATORY_CARE_PROVIDER_SITE_OTHER): Payer: Medicare Other | Admitting: Family Medicine

## 2011-03-02 DIAGNOSIS — E538 Deficiency of other specified B group vitamins: Secondary | ICD-10-CM

## 2011-03-02 MED ORDER — CYANOCOBALAMIN 1000 MCG/ML IJ SOLN
1000.0000 ug | Freq: Once | INTRAMUSCULAR | Status: AC
  Administered 2011-03-02: 1000 ug via INTRAMUSCULAR

## 2011-03-02 NOTE — Progress Notes (Signed)
  Subjective:    Patient ID: Bill Walker, male    DOB: [birth year], 77 y.o.   MRN: [id number]  HPI Pt here for B1`2 injection   Review of Systems     Objective:   Physical Exam        Assessment & Plan:

## 2011-03-03 ENCOUNTER — Ambulatory Visit: Payer: Medicare Other

## 2011-03-07 ENCOUNTER — Ambulatory Visit (INDEPENDENT_AMBULATORY_CARE_PROVIDER_SITE_OTHER): Payer: Medicare Other | Admitting: Family Medicine

## 2011-03-07 ENCOUNTER — Encounter: Payer: Self-pay | Admitting: Family Medicine

## 2011-03-07 DIAGNOSIS — L989 Disorder of the skin and subcutaneous tissue, unspecified: Secondary | ICD-10-CM

## 2011-03-07 NOTE — Assessment & Plan Note (Addendum)
R leg lesion anticipate resolving hematoma - will take time to heal. Doubt infection as improving and not as painful as would be expected with fluctuant abscess.  Even if infection would be hesitant to cut skin given comorbidities.  Seems to be improving so will closely monitor for now. Continue doxy and elevation, tylenol, warm compresses.  Return in 1 1/2 wks for f/u, sooner if red flags.

## 2011-03-07 NOTE — Progress Notes (Signed)
  Subjective:    Patient ID: Bill Walker, male    DOB: [birth year], 77 y.o.   MRN: [id number]  HPI CC: f/u fall  DOI: 02/16/2011  Pain improved.  Swelling still elevated.  Went into outbuilding and tripped over stairs, fell onto doorframe.  Minimal bleeding. Went home, put ice.  Seen at [facility] x2.  Treated with pain med and elevation of leg.  Using tylenol for pain.  Second [facility] visit (4/18) started on doxycycline.  Has been using heat for last 2 wks.  Swelling going down some.  Prior to fall, no dizziness, LOC, HA.  Just tripped over stairway.  No fevers/chills, no n/v.  On aspirin 325 and plavix.  Review of Systems Per HPI    Objective:   Physical Exam  [vitalsreviewed. Constitutional: He appears well-developed and well-nourished. No distress.  Skin:       R anterior leg with swelling about 8cm diameter, tight skin, centrally with some fluctuance.  Decreasing tenderness.  1+ pitting edema R leg compared to left.  No bony tenderness.  Erythema delineated.  2+ DP          Assessment & Plan:

## 2011-03-07 NOTE — Patient Instructions (Signed)
Continue elevation, warm compresses/heat, tylenol, and doxycycline for full 10 days. Return sooner if fever, spreading redness up leg, or worsening pain/swelling. Hope it continues to heal as up to now.  Good to see you today.

## 2011-03-20 ENCOUNTER — Telehealth: Payer: Self-pay | Admitting: *Deleted

## 2011-03-20 MED ORDER — TIOTROPIUM BROMIDE MONOHYDRATE 18 MCG IN CAPS: 18.0000 ug | ORAL_CAPSULE | Freq: Every day | RESPIRATORY_TRACT | Status: DC

## 2011-03-20 MED ORDER — CLOPIDOGREL BISULFATE 75 MG PO TABS: 75.0000 mg | ORAL_TABLET | Freq: Every day | ORAL | Status: DC

## 2011-03-20 NOTE — Telephone Encounter (Signed)
Refill for spiriva and plavix went to [facility] instead of [facility]. Spoke with pt's wife and pt does need refills and pt is at home now. Pt was in [facility] hospital and discharged 01/04/11 with acute hypoxemic respiratory failure secondary to community acquired pneumonia and frequent aspiration pnemonitis.Please advise.

## 2011-03-20 NOTE — Telephone Encounter (Signed)
Medication phoned to [facility] [phone] pharmacy as instructed.Spoke with [employer].

## 2011-03-20 NOTE — Telephone Encounter (Signed)
Px written for call in   

## 2011-03-28 NOTE — H&P (Signed)
NAMEBURECH, MCFARLAND NO.:  [id number]   MEDICAL RECORD NO.:  [id number]          PATIENT TYPE:  INP   LOCATION:  3731                         FACILITY:  [facility]   PHYSICIAN:  Duke Salvia, MD, FACCDATE OF BIRTH:  Mar 17, 1928   DATE OF ADMISSION:  07/07/2009  DATE OF DISCHARGE:                              HISTORY & PHYSICAL   PRIMARY CARE PHYSICIAN:  Marne A. Tower, MD   CHIEF COMPLAINT:  Chest pain.   HISTORY OF PRESENT ILLNESS:  Mr. Chuba is a 77 year old Caucasian male  with known CAD, having just had a re-look cardiac cath completed 2 days  ago on July 05, 2009, showing no significant new disease and continued  medical management recommended at that time.  He also has history  significant for hypertension, hypercholesterolemia, anemia, COPD on home  O2, and renal insufficiency and he is presenting with acute onset of  chest pain with associated shortness of breath and diaphoresis.  The  patient was awakened at approximately 4 a.m. today with squeezing low  substernal chest pain without radiation and associated shortness of  breath and diaphoresis as well as chills.  The patient took his aspirin  and 2 sublingual nitroglycerin with no relief, so after explaining his  symptoms to his wife, she brought him to the [facility] ED for further eval.  Shortly after arriving, his symptoms resolved (lasted approximately 1  hour).  His symptoms have not returned.  Upon arrival to the ED, his BP  was elevated above baseline to 179/85, but heart rate was within normal  limits, EKG without significant changes, and point-of-care markers have  been negative x2.  A chest x-ray showed a small focal air space  opacities in both lungs in a relatively peripheral distribution possibly  reflecting mild acute infections or inflammatory process.  Otherwise,  just a small bilateral effusions and mild cardiomegaly.   PAST MEDICAL HISTORY:  1. CAD (catheterization in 2006 demonstrated an  occluded LAD, proximal      stent in the RCA which was patent, distal 75 and 95% RCA lesion.      Initially, he was to have PCI, but ended up having medical      management only.  2. Cardiomyopathy (EF 40%).  3. Mild chronic anemia.  4. B12 deficiency.  5. Thrombocytopenia.  6. Gout.  7. Chronic renal insufficiency.  8. Chronic lung disease, O2 dependent.  9. Hypertension.  10.Hyperlipidemia.   SOCIAL HISTORY:  The patient lives in [place] with his wife.  He is  retired.  He has no significant tobacco, EtOH, or illicit drug use  history.  He takes no herbal medications, has a regular diet, and does  not regularly exercise secondary to his comorbidities.  He is able to  get round a little bit, but cannot walk long distances.   FAMILY HISTORY:  Noncontributory secondary to the patient's advanced age  and known disease.   REVIEW OF SYSTEMS:  Please see HPI.  All other systems reviewed and were  negative.   CODE STATUS:  Full.   ALLERGIES AND INTOLERANCES:  1. PENICILLIN  2. DOXAZOSIN (CARDURA).   MEDICATIONS:  1. Spiriva 18 mcg tab 1 inhale daily.  2. Furosemide 80 mg p.o. daily.  3. Imdur 30 mg tab 2 p.o. daily.  4. Toprol-XL 100 mg p.o. daily.  5. Cozaar 50 mg p.o. b.i.d.  6. Plavix 75 mg p.o. daily.  7. Zocor 20 mg p.o. daily.  8. Aspirin 325 mg p.o. daily.  9. Allopurinol 300 mg p.o. daily.  10.Home O2   PHYSICAL EXAMINATION:  VITAL SIGNS:  Temperature 98.2 degrees  Fahrenheit.  BP initially 167/79 down up to 139/85, currently in the  150s/80s.  Respiration rate initially 17, now in the low 20s and high  10s.  Pulse initially 77, up to 85 staying within normal limits.  O2  saturation 97% on 2 liters by nasal cannula.  GENERAL:  The patient is alert and oriented x3.  He is in no apparent  distress.  He was found sleeping soundly.  Once he was awakened, it was  apparent that he has significantly decreased ability to hear, but does  not have respiratory distress  with short sentences.  HEENT:  His head is normocephalic and atraumatic.  Pupils are equal,  round, and reactive to light.  Extraocular muscles are intact.  Nares  patent without discharge.  Oropharynx without erythema or exudate.  NECK:  Supple without lymphadenopathy.  No JVD.  No thyromegaly.  HEART:  Heart rate is regular.  Audible S1 and S2.  No clicks, rubs,  murmurs, or gallops and PMI is not displaced.  Pulses are 2+ and equal  in both upper and lower extremities bilaterally.  LUNGS:  Clear to auscultation bilaterally.  SKIN:  No rashes, lesions, or petechiae.  ABDOMEN:  Soft, nontender, and nondistended.  Normal abdominal bowel  sounds.  No rebound or guarding.  No hepatosplenomegaly.  No pulsations.  The patient is obese.  EXTREMITIES:  No clubbing or cyanosis.  There is trace ankle edema  bilaterally.  MUSCULOSKELETAL:  No joint deformity or effusions.  No spinal or CVA  tenderness.  NEURO:  Cranial nerves II-XII are grossly intact except for cranial  nerve VIII which was intact, but hearing significantly appreciated.  Strength 5/5 in all extremities and axial groups.  Normal sensation  throughout and normal cerebellar function.   RADIOLOGY:  Chest x-ray, see HPI.  EKG, normal sinus rhythm at a rate of  77, lateral T-wave inversions and inferior Q-waves, normal axis, no  evidence of hypertrophy.  PR 152, QRS 100, QTc 466.  No significant  changes from the tracing completed on July 05, 2009.   LABORATORY DATA:  WBC 6.5 with normal differential, HGB 11.4, HCT 34.4,  and PLT count 97.  Sodium 138, potassium 4.4, chloride 103, CO2 of 32,  BUN 19, creatinine 1.23, and glucose 143.  Point-of-care markers were  negative x2.  Cardiac enzymes negative x1.  PT 13.2, PTT 35, and INR  1.0.   ASSESSMENT/PLAN:  Mr. Amparan is a 77 year old Caucasian male with known  CAD having just had a re-look remote cardiac cath on July 05, 2009,  with no significant changes from prior studies  and continued medical  management recommended as well as multiple other medical problems,  presenting with acute onset of chest pain associated with shortness of  breath and diaphoresis.  1. Chest pain, unlikely to be of a cardiac etiology given recent cath      results, but symptoms are concerning for acute coronary syndrome.      We  will admit to telemetry, cycle cardiac enzymes, continue home      medications, and do further cardiac workup on results of above.  We      will also check a D-dimer in the unlikely event of PE.  The patient      is yet to be seen and evaluated by Dr. Graciela Husbands.  Please see addendum      for any significant changes for this assessment and plan.      Jarrett Ables, [facility]      Duke Salvia, MD, [facility]  Electronically Signed    MS/MEDQ  D:  07/07/2009  T:  07/08/2009  Job:  [id number]

## 2011-03-28 NOTE — Consult Note (Signed)
Bill Walker, Bill Walker              ACCOUNT NO.:  [id number]   MEDICAL RECORD NO.:  [id number]          PATIENT TYPE:  INP   LOCATION:  3731                         FACILITY:  [facility]   PHYSICIAN:  Casimiro Needle B. Sherene Sires, MD, FCCPDATE OF BIRTH:  [birth year]   DATE OF CONSULTATION:  07/08/2009  DATE OF DISCHARGE:                                 CONSULTATION   REQUESTING PHYSICIAN:  Duke Salvia, MD, [facility]   REASON FOR CONSULTATION:  Possible pneumonia.   HISTORY:  This is a 77 year old white male remote smoker seen previously  in a Pulmonary Clinic for emphysematous COPD with remarkable improvement  chronically on mono therapy with Spiriva with no need for rescue therapy  and oxygen at 2 L/min 24 hours a day.  He was in his usual state of  health until developing chest pain and was admitted from July 06, 2009  to July 07, 2009, for left heart cath that showed no culprit lesions,  although he did have an LVEDP of 24.  He was then readmitted  8/25 with  recurrent cp.  He tells me that the chest pain had no pleuritic features  and was diffuse across the top of his chest associated diaphoresis, but  he ruled out for MI.  He is now pain free.  However, CT scan of the  chest suggest left lower lobe pneumonia and I was asked to see him.   The patient denies any increasing cough, definite purulent sputum, or  fevers or chills.  He has had sweats as noted.  He denies any increase  in baseline dyspnea with reproducable exertion or pleuritic chest pain.   PAST MEDICAL HISTORY:  Significant for:  1. COPD.  2. Ischemic heart disease.  3. Diverticulosis.  4. Gout.  5. Hypertension.  6. Osteoarthritis.  7. Renal insufficiency.   MEDICATIONS PRIOR TO ADMISSION:  Spidery, furosemide, Imdur, Toprol,  Cozaar, Plavix, Zocor, aspirin, allopurinol, and oxygen 2 L/min 24 hours  a day.   ALLERGIES:  1. PENICILLIN.  2. CARDURA.   FAMILY HISTORY:  Negative for asthma or atrophy.   SOCIAL HISTORY:   He is a remote smoker.   REVIEW OF SYSTEMS:  Taken in detail and significant for the absence of  dysphagia or risk factors for aspiration.   PHYSICAL EXAMINATION:  GENERAL:  This is a chronically ill-appearing  elderly white male who appears 10 years older than stated age.  He is  afebrile.  VITAL SIGNS:  Afebrile.  HEENT:  Remarkable for normal dentition.  NECK:  Supple without cervical adenopathy or tenderness.  Trachea is  midline.  No thyromegaly.  CHEST:  Barrel-shaped with positive Hoover signs early on inspiration.  There are few crackles on inspiration in the left base.  There is a  regular rhythm without murmur, gallop, or rub present.  ABDOMEN:  Soft, benign with no palpable organomegaly, mass or  tenderness.  EXTREMITIES:  Warm without calf tenderness, cyanosis, clubbing, or  edema.   LABORATORY DATA:  On 2 days from it, he has a saturation of 99%.  He has  a bicarb level of 33.  CT scan of the chest shows COPD with bronchiectatic changes in both  lower lobes medially plus  left lower lobe air space density c/w pna.   IMPRESSION:  1. Chest pain of unclear etiology.  This patient is extremely hard of      hearing, but I could not detect a pleuritic lateralizing nor for      that matter localizing aspect to his chest pain.  I do believe by      chest CT scanning has criteria for pneumonia and should be treated      as such with Avelox for 7-10 days with followup chest x-ray after      that time.  2. His chronic obstructive pulmonary disease is very well compensated,      although he does have chronic hypoxemic respiratory failure and      needs to continue his Spiriva daily along with oxygen 2 L/min      continuously, which provides him with adequate oxygen.  3. This leaves unexplained his episode of chest pain with 2 admission      rule outs now and no ongoing chest pain at present.  For now, I      would simply treat him empirically for ischemic heart disease,       which is suggested somewhat by the diffuse nature of the chest      discomfort with a non-pleuritic nature associated with diaphoresis,      but no nausea or vomiting by history and perhaps add empiric PPI      for possible GERD.   I would like to see him up walking on oxygen prior to discharge on  Avelox for 7-10 days as outlined above with pulmonary follow up on a  p.r.n. basis.      Charlaine Dalton. Sherene Sires, MD, [facility]  Electronically Signed     MBW/MEDQ  D:  07/08/2009  T:  07/09/2009  Job:  [id number]   cc:   Marne A. Milinda Antis, MD

## 2011-03-28 NOTE — H&P (Signed)
Bill Walker, Bill Walker NO.:  [id number]   MEDICAL RECORD NO.:  [id number]          PATIENT TYPE:  INP   LOCATION:  4710                         FACILITY:  [facility]   PHYSICIAN:  Rollene Rotunda, MD, FACCDATE OF BIRTH:  [birth year]   DATE OF ADMISSION:  07/04/2009  DATE OF DISCHARGE:                              HISTORY & PHYSICAL   PRIMARY DOCTOR:  Marne A. Tower, MD.   REASON FOR PRESENTATION:  Evaluate this patient with back pain.   HISTORY OF PRESENT ILLNESS:  The patient is a 77 year old gentleman with  previous coronary stents.  He has not seen a cardiologist in several  years.  His last catheterization, as documented below, was in 2006.   He was in his usual state of health.  He is quite limited by chronic  lung disease.  He is O2 dependent.  He went to sleep and woke suddenly  this morning with pain across his shoulders in the back.  This was  similar to his previous angina.  It was a tightness.  There was at least  moderately severe.  It did not radiate to his jaw.  He had no substernal  chest pain or arm pain.  He could not find a comfortable position.  The  pain was not made worse by breathing or movement.  He did take 2 aspirin  without help.  He took 1 sublingual nitroglycerin (it was very old)  without improvement.  He presented to the emergency room by private  vehicle.  By the time he came here, his pain had begun to ease and was  completely gone by the time he was brought into the ER.  This was over  an hour total of pain.  He had no acute EKG changes.  Initial point of  care markers are negative.   Recently the patient has had no substernal chest pain.  He has his  chronic dyspnea but denies PND or orthopnea.  He has not had any  palpitation, presyncope or syncope.   PAST MEDICAL HISTORY:  1. Coronary artery disease (catheterization 2006 demonstrated an      occluded LAD.  There is a proximal stent in the RCA which was      patent.  He had distal  75% and 95% RCA lesions.  Initially it was      thought that he would have PCI but he was managed medically).  2. Cardiomyopathy (EF 40%).  3. Mild chronic anemia.  4. B12 deficiency.  5. Thrombocytopenia.  6. Gout.  7. Chronic renal insufficiency.  8. Chronic lung disease, O2 dependent.  9. Hypertension.  10.Hyperlipidemia.   PAST SURGICAL HISTORY:  None.   ALLERGIES:  PENICILLIN AND CARDURA.   MEDICATIONS:  1. Allopurinol 300 mg daily.  2. Aspirin 325 mg daily.  3. Cozaar 50 mg daily.  4. Imdur 30 mg daily.  5. Lasix 80 mg daily.  6. Plavix 75 mg daily.  7. Spiriva.  8. Toprol XL 100 mg daily.  9. Zocor 20 mg daily.   SOCIAL HISTORY:  The patient is married.  He was a  previous smoker but  quit several years ago.  Previous alcohol user but also quit several  years ago.  He is retired from [employer].   FAMILY HISTORY:  Noncontributory.   REVIEW OF SYSTEMS:  As stated in the HPI and positive for deafness.  Negative for all other systems.   PHYSICAL EXAMINATION:  The patient is in no distress.  Blood pressure  170/75, heart rate 65 and regular, respiratory rate 17, afebrile.  HEENT: Eyelids unremarkable.  Pupils equal, round, react to light.  Fundi not visualized.  Oral mucosa unremarkable.  NECK: No jugular distention at 45 degrees, carotid upstroke brisk and  symmetric, no bruits, no thyromegaly.  LYMPHATICS:  No lymphadenopathy.  LUNGS: Clear to auscultation bilaterally.  BACK:  No costovertebral angle tenderness.  CHEST:  Unremarkable.  HEART:  PMI not displaced or sustained, S1 and S2 within normal limits,  no S3, no S4;  no clicks, rubs, murmurs.  ABDOMEN: Obese, positive bowel  sounds normal frequency and pitch.  No bruits, no rebound, no guarding  or midline pulsatile mass, no hepatomegaly, no splenomegaly.  SKIN:  No rashes, no nodules.  EXTREMITIES:  2+ pulses throughout, trace ankle edema, no cyanosis or  clubbing.  NEUROLOGIC:  Oriented to  place, time and time.  Cranial nerves II-XII  grossly intact, motor grossly intact.   LABORATORY DATA:  WBC 12.5, hemoglobin 8.8, platelets 129, sodium 141,  potassium 4.2, BUN 39, creatinine 1.7.   Chest x-ray: Bilateral mild atelectasis.   ASSESSMENT/PLAN:  1. Back pain.  This is the patient's anginal pain.  I think the      possibility of progressive high-grade obstructive coronary disease      is quite high.  However, catheterization will be somewhat      complicated given his renal insufficiency.  At this point, I will      start him on heparin, he will continue aspirin and beta blockers.      I am going to, for 1 day, hold his Cozaar and diuretics and follow      his creatinine.  I would suggest cardiac catheterization if his      creatinine will allow.  2. Renal insufficiency as above.  3. Hypertension.  Blood pressure is elevated.  This will be managed in      the context of treating his acute problems.  He may need adjustment      at discharge to his regimen.  4. Cardiomyopathy.  He has not seen Dr. Eden Emms in years.  Ejection      fraction was reduced in 2006.  I will repeat an echocardiogram.  5. Chronic lung disease.  He will continue his oxygen.  6. Hyperlipidemia.  He will continue his statin and I will check a      fasting lipid profile.  7. Thrombocytopenia.  This is mild and felt in the past to be related      to EtOH.      Rollene Rotunda, MD, [facility]  Electronically Signed     JH/MEDQ  D:  07/04/2009  T:  07/04/2009  Job:  [id number]

## 2011-03-28 NOTE — Cardiovascular Report (Signed)
NAMESHERRILL, MCKAMIE NO.:  [id number]   MEDICAL RECORD NO.:  [id number]          PATIENT TYPE:  INP   LOCATION:  4710                         FACILITY:  [facility]   PHYSICIAN:  Bevelyn Buckles. Bensimhon, MDDATE OF BIRTH:  May 10, 1928   DATE OF PROCEDURE:  07/05/2009  DATE OF DISCHARGE:                            CARDIAC CATHETERIZATION   PRIMARY CARE PHYSICIAN:  Marne A. Tower, MD   CARDIOLOGIST:  Noralyn Pick. Eden Emms, MD, [facility]   SURGEON:  Bevelyn Buckles. Bensimhon, MD   PATIENT IDENTIFICATION:  Bill Walker is a 77 year old male with multiple  medical problems including O2-dependent lung disease, LV dysfunction  with an EF of 40%, renal insufficiency, and hypertension.  He also has a  history of coronary artery disease and status post previous stenting to  the RCA x3.  His most recent cardiac catheterization was in 2006 which  showed a totally occluded LAD and there was apparently a tight disease  in the distal RCA which was treated medically.  He was admitted with  back pain thought to possibly be his anginal equivalent.  His cardiac  markers have been negative.  He did have some renal insufficiency but  this has improved.  Creatinine was down from 17 on admission to 13 on  the day of catheterization.   PROCEDURES PERFORMED:  1. Selective coronary angiography.  2. Left heart catheterization.   DESCRIPTION OF PROCEDURE:  The risks and indications of the  catheterization were explained.  Consent was signed and placed on the  chart.  The right groin area was prepped and draped in routine sterile  fashion and a 5-French arterial sheath was placed using a modified  Seldinger technique.  Standard catheters including JL-4, JR-4, and  angled pigtail were used for procedure.  All the catheter exchanges were  made over the wire.  There were no apparent complications.   FINDINGS:  Central aortic pressure was 179/92 with a mean of 128.  LV  pressure was 171/18 with an EDP of 24.  There is  no aortic stenosis on  pullback.   Left main had a distal 20% stenosis.   LAD had a 40% proximal lesion before being totally occluded after the  takeoff of the first diagonal.  The distal LAD filled from collaterals  from the circumflex.  This was unchanged from previous.   Left circumflex gave off two OM branches and a posterolateral.  There is  a 40% proximal lesion in the circumflex.  Otherwise, just some minor  luminal irregularities.   The right coronary artery was a dominant vessel.  It had a proximal  stent and what appeared to be a stent in the midsection and also a  possible stent in the distal RCA though it was a bit hard to tell.  The  proximal stent was widely patent.  There was mild plaquing about 20% in  the mid stent.  Right after the mid stent, there was a 50-60% stenosis  which was somewhat hazy.  This was an eccentric plaque looking worse in  the RAO cranial view.  Somewhat more distally what appeared to  be just  after the stented segment, there was a 50-60% lesion.  There was distal  60% stenosis at the takeoff of the PDA and some mild plaque more  distally.   Left ventriculogram not performed due to renal insufficiency.   ASSESSMENT:  Coronary artery disease with totally occluded left anterior  descending.  He does appear to have step-in and step-out of what appears  to be a distal right coronary artery stent.  However, this does not  appear to be hemodynamically significant.   PLAN/DISCUSSION:  I reviewed the films with Dr. Clifton James.  We both feel  that the RCA disease is not the cause of his resting back pain.  If his  symptoms persist, we would consider a Myoview to evaluate the inferior  wall for ischemia but my preference would be to treat medically.  I will  review with Dr. Eden Emms.      Bevelyn Buckles. Bensimhon, MD  Electronically Signed     DRB/MEDQ  D:  07/05/2009  T:  07/06/2009  Job:  [id number]   cc:   Marne A. Milinda Antis, MD  Noralyn Pick. Eden Emms, MD,  [facility]

## 2011-03-28 NOTE — Discharge Summary (Signed)
NAMENITISH, ROES NO.:  [id number]   MEDICAL RECORD NO.:  [id number]          PATIENT TYPE:  EMS   LOCATION:  MAJO                         FACILITY:  [facility]   PHYSICIAN:  Noralyn Pick. Eden Emms, MD, FACCDATE OF BIRTH:  [birth year]   DATE OF ADMISSION:  07/07/2009  DATE OF DISCHARGE:  07/06/2009                               DISCHARGE SUMMARY   PRIMARY PHYSICIAN:  Marne A. Tower, MD   PRIMARY CARDIOLOGIST:  Noralyn Pick. Eden Emms, MD, [facility]   PROCEDURES PERFORMED DURING HOSPITALIZATION:  Cardiac catheterization  completed on July 05, 2009, by Dr. Arvilla Meres revealing coronary  artery disease with totally occluded left anterior descending.  He does  appear to have a step-in and step-out of what appears to be a distal  right coronary artery stent.  However, this does not appear to be  hemodynamically significant.  The films were reviewed with Dr. Clifton James  and did not feel that the right coronary artery disease was the cause of  resting back pain.   FINAL DISCHARGE DIAGNOSES:  1. Back pain chronically.  2. Coronary artery disease.      a.     Status post cardiac catheterization dated July 05, 2009,       revealing right coronary artery disease with distal right coronary       artery stent, not believed to be etiology of discomfort.      b.     Left main distal 20% stenosis, left anterior descending 40%       proximal lesion before being totally occluded after the takeoff of       the first diagonal, distal left anterior descending filled with       collaterals from the circumflex, unchanged from previous.  Left       circumflex gave off 2 obtuse marginal branches and posterior       lateral.  There was a 40% proximal lesion in the circumflex,       otherwise just minor luminal irregularities.  Right coronary       artery was dominant vessel, proximal stent and what appeared to be       stent in the mid section and also a possible stent in the distal       right  coronary artery, though it was a bit hard to tell.  Proximal       stent was widely patent.  There was mild plaquing about 20% in the       mid stent.  Right after the mid stent, there was a 50-60% stenosis       which was somewhat hazy.  This was an eccentric plaque looking       worse in the right anterior oblique cranial view, somewhat more       distally what appeared to be just after the distended segment,       there was a 50-60% lesion.  There was a distal 60% stenosis at the       takeoff of the posterior descending artery and some mild plaque  more distally.  Left ventriculogram was not performed secondary to       renal insufficiency.  3. Cardiomyopathy with an ejection fraction of 40%.  4. Mild chronic anemia.  5. B12 deficiency.  6. Thrombocytopenia.  7. Gout.  8. Chronic renal insufficiency, creatinine between 1.3 and 1.7,      baseline.  9. Chronic obstructive pulmonary disease with oxygen dependence.  10.Hypertension.  11.Hyperlipidemia.   HOSPITAL COURSE:  A 77 year old gentleman with known history of CAD with  stent to the right coronary artery who had not been seen by cardiologist  for several years who was in his usual state of health, but limited by  chronic lung disease and is oxygen dependent, who woke suddenly in the  morning of admission with pain across the shoulders and back.  This was  similar to the previous angina.  It was described as tightness and  moderately severe.  It was nonradiational.  The patient could not find a  comfortable position and it was not made worse by breathing or movement.  He took 2 aspirin prior to coming to the hospital, which was without  help.  He also took sublingual nitroglycerin, which was old and it did  not improve.  On presentation to the emergency room, the patient had  begun to have some pain diminishing, but it was completely gone by the  time he was brought to the emergency room.  However, he had a total of 1   hour of pain prior.   The patient was found to be hypertensive on arrival with a blood  pressure of 170/75.  The patient's creatinine was 1.7 on admission.   The patient was seen and examined by Dr. Rollene Rotunda on arrival and  admitted to rule out myocardial infarction.  He was placed on heparin  and nitroglycerin and monitored closely.  His cardiac enzymes were  cycled and the patient was scheduled for cardiac catheterization the  following day.  Cardiac enzymes were found to be negative x2 at 0.02 and  0.01 with point of care less than 0.05 respectively.  The patient was  started on IV hydration and cardiac catheterization was completed.  Please see cardiac catheterization note for more details and above  details as described.  The patient tolerated the procedure well and had  no further complaints of discomfort.  He was advised that the discomfort  he was undergoing in his chest we did not believe to be cardiac in  etiology.  It was suggested on outpatient that the patient have a stress  Myoview if pain recurred.   On the day of discharge, the patient was seen and examined by Dr. Charlton Haws, was found to be stable, and was not having any recurrence of  chest discomfort.  The patient will be discharged home on aspirin,  Plavix, and other medications prior to his admission.  We will also make  sure he has a prescription for a fresh dose of nitroglycerin prior to  discharge.   DISCHARGE LABORATORY DATA:  Hemoglobin 11.0, hematocrit 32.4, white  blood cells 5.0, and platelets 94.  Sodium 139, potassium 3.9, chloride  106, CO2 30, glucose 118, BUN 19, and creatinine 1.1.  Cholesterol 92,  triglycerides 65, HDL 32, LDL 47, and TSH 2.085.  Chest x-ray dated  July 04, 2009, mild left basilar atelectasis, chronically increased  interstitial markings.  EKG on discharge, normal sinus rhythm with old  inferior and lateral infarct.   DISCHARGE MEDICATIONS:  Please see electronic  list.   FOLLOWUP PLANS AND APPOINTMENT:  1. The patient will be given post cardiac catheterization instructions      with particular emphasis on the right groin site for evidence of      bleeding, hematoma, or any signs of infection.  2. The patient will follow up with Dr. Charlton Haws on Tuesday,      August 31, 2009, at 3:30 p.m.  3. The patient will be considered for a stress Myoview as an      outpatient at Dr. Fabio Bering discretion should he have recurrence of      discomfort.  4. The patient has been given new prescriptions for Plavix and for      nitroglycerin.  5. The patient has been advised to bring all medications to follow up      appointments.   Time spent with the patient to include physician time 35 minutes.      Bettey Mare. Lyman Bishop, NP      Noralyn Pick. Eden Emms, MD, [facility]  Electronically Signed    KML/MEDQ  D:  07/06/2009  T:  07/07/2009  Job:  [id number]   cc:   Marne A. Milinda Antis, MD

## 2011-03-28 NOTE — Discharge Summary (Signed)
Bill Walker, Bill Walker NO.:  [id number]   MEDICAL RECORD NO.:  [id number]          PATIENT TYPE:  INP   LOCATION:  3731                         FACILITY:  [facility]   PHYSICIAN:  Doylene Canning. Ladona Ridgel, MD    DATE OF BIRTH:  08-May-1928   DATE OF ADMISSION:  07/07/2009  DATE OF DISCHARGE:  07/10/2009                               DISCHARGE SUMMARY   PRIMARY/FINAL DISCHARGE DIAGNOSIS:  Coronary artery disease.   SECONDARY DIAGNOSES:  1. Pneumonia.  2. Emphysematous chronic obstructive pulmonary disease.  3. Diverticulosis.  4. Gout.  5. Hypertension.  6. Osteoarthritis.  7. Anemia.  8. B12 deficiency.  9. Thrombocytopenia.  10.Chronic kidney disease.  11.O2 dependency.  12.Hyperlipidemia.  13.Cardiomyopathy with an ejection fraction of 40%.  14.Allergy or intolerance to PENICILLIN and CARDURA.   TIME AT DISCHARGE:  41 minutes.   HOSPITAL COURSE:  Bill Walker is a 77 year old male with known coronary  artery disease.  He had a cath on July 05, 2009, and was discharged on  July 06, 2009, with medical therapy planned.  He had recurrent chest  pain and came back to the hospital and was admitted on July 07, 2009.   A D-dimer was elevated, so a CT angiogram of the chest was performed.  The CT angiogram of the chest showed left medial lower lobe pneumonia.  Because of his history of COPD, a pulmonary consult was called.  He was  seen by Dr. Sherene Sires, who felt that his chest pain was not clearly pleuritic  nor clearly cardiac.  He felt that he should be treated with Avelox and  a followup chest x-ray obtained to document clearing of his pneumonia.  He will follow up as an outpatient for this.   His cardiac enzymes were negative for MI.  Initially, cardiac  catheterization was planned but since his cardiac markers were negative  and there was concern for chest pain being related to pneumonia, it was  felt that repeat catheterization should be deferred until his pneumonia  had resolved.  If the chest pain recurs after the pneumonia has  resolved, intravascular ultrasound of the RCA can be performed to make  sure that this lesion seen on previous cath is not the source of his  pain.   On July 10, 2009, Bill Walker respiratory status had improved.  He was  still having problems with hypoxia during exertion.  He is on O2, but it  was felt that this could be managed as an outpatient.  On July 10, 2009, Bill Walker was evaluated by Dr. Ladona Ridgel who considered him stable  for discharge with close outpatient followup.   DISCHARGE INSTRUCTIONS:  His activity level is to be increased as  tolerated.  He is not to do any lifting for 2 weeks.  He is to follow up  with Dr. Eden Emms in approximately 2 weeks.  He is to get an outpatient  Myoview.   Discharge medications are per the computerized discharge med list.       Theodore Demark, PA-C      Doylene Canning. Ladona Ridgel, MD  Electronically  Signed    RB/MEDQ  D:  07/10/2009  T:  07/11/2009  Job:  [id number]   cc:   Marne A. Milinda Antis, MD

## 2011-03-30 ENCOUNTER — Ambulatory Visit (INDEPENDENT_AMBULATORY_CARE_PROVIDER_SITE_OTHER): Payer: Medicare Other | Admitting: Family Medicine

## 2011-03-30 DIAGNOSIS — E538 Deficiency of other specified B group vitamins: Secondary | ICD-10-CM

## 2011-03-30 MED ORDER — CYANOCOBALAMIN 1000 MCG/ML IJ SOLN
1000.0000 ug | Freq: Once | INTRAMUSCULAR | Status: AC
  Administered 2011-03-30: 1000 ug via INTRAMUSCULAR

## 2011-03-30 NOTE — Progress Notes (Signed)
  Subjective:    Patient ID: Bill Walker, male    DOB: 1927/12/07, 77 y.o.   MRN: [id number]  HPI Here for injection only   Review of Systems     Objective:   Physical Exam        Assessment & Plan:

## 2011-03-31 NOTE — Procedures (Signed)
[place]. [facility]  Patient:    Bill Walker, Bill Walker                       MRN: [id number] Proc. Date: 01/01/00 Adm. Date:  [id number] Attending:  Tower, [place] CC:         Roxy Manns, M.D. [facility]                           Procedure Report  PROCEDURE: Upper endoscopy.  INDICATIONS: This 77 year old white male was admitted last night with a large amount of painless rectal bleeding, passing maroon stools which were clearly Hemoccult positive.  His hemoglobin dropped to 9.7.  His hemoglobin was 16 in August of 2000 during his previous hospitalization.  This morning it dropped to  8.3.  The patient has been in the ICU and his vital signs were stable.  He has coronary artery disease and has been intermittently on Indocin for gout and also low-dose aspirin.  He is undergoing upper endoscopy because his BUN has been elevated to 42 which was out of proportion to his usual BUN of 20 which suggested possible upper GI etiology of the bleeding.  ENDOSCOPE: Fujinon single-channel video endoscope.  SEDATION: Versed 5 mg IV.  FINDINGS:  Esophagus: The Fujinon single-channel endoscope was passed through the posterior pharynx and esophagus.  The patient was monitored by pulse oximeter and oxygen saturations were normal.  He was cooperative.  The proximal and distal esophageal mucosa were unremarkable.  There was a normal squamocolumnar junction.  Distal o the junction there was a small hiatal hernia.  Stomach: The stomach was insufflated with air.  There was no blood.  The ______  appeared normal. The endoscope passed easily through the antrum where there was  mild erythema in the prepyloric antrum.  Retroflexion of the endoscope revealed a normal fundus and cardia.  Duodenum.  The duodenal bulb and descending duodenum were normal.  There were no lesions to account for the GI bleed.  IMPRESSION: Minimal gastritis.  No evidence of bleeding.  PLAN: 1. Continue  bowel rest. 2. Continue holding aspirin and Indocin. 3. Transfuse 2 units of packed cells. 4. Continue Pepcid IV. 5. Colonoscopy in the next 48 hours.  If the bleeding continues, bleeding    scan or arteriogram. DD:  01/01/00 TD:  01/01/00 Job: [id number]

## 2011-03-31 NOTE — Assessment & Plan Note (Signed)
[facility]                              CARDIOLOGY OFFICE NOTE   CARLSON, BELLAND                       MRN:          [id number]  DATE:09/20/2006                            DOB:          1928-09-11    Jourdain returns today in followup.  He has an occluded distal LAD with  collaterals.  His coronary artery disease has been stable.  His COPD and  emphysema are his rate-limiting medical problems.  The patient is on home  O2.  He has actually been traveling quite a bit and seems a bit better.  He  did get his flu shot.  He is not having any significant angina.  His  medications are stable form a cardiac standpoint.  He has been able to  tolerate 100 of Toprol, and he is on Imdur 60 a day.  He has sublingual  nitroglycerin.   EXAM:  SKIN:  Warm and dry.  HEENT:  Normal.  He has hearing aids in bilaterally.  There is no  lymphadenopathy.  There is no thyromegaly.  He has rhonchi in the right and left lower lobes with mild wheezing.  He has  O2 on.  There is and S1 and S2 with normal heart sounds.  ABDOMEN:  Benign.  LOWER EXTREMITIES:  Intact pulses.  No edema.  NEURO:  Nonfocal.   MEDICATIONS:  1. An aspirin a day.  2. Cozaar 50 b.i.d.  3. Plavix 75 a day.  4. Spiriva.  5. Zocor 20 a day.  6. Toprol 100 a day.  7. Lasix 80 a day.  8. Imdur 60 a day.  9. O2 2L constantly with a concentrator at home.  10.Allopurinol 300 a day.   IMPRESSION:  Stable coronary artery disease.  Occluded distal left anterior  descending with collaterals.  No significant angina.  Primary morbidity  would be chronic obstructive pulmonary disease.  The patient follow up with  Dr. Sherene Sires.  Continue current medical therapy.  His ejection fraction is 40%  and he has not had any significant congestive heart failure.  I will see him  back in 6 months.    ______________________________  Noralyn Pick. Eden Emms, MD, [facility]    PCN/MedQ  DD: 09/20/2006  DT: 09/20/2006  Job #:  [id number]

## 2011-04-11 ENCOUNTER — Other Ambulatory Visit: Payer: Self-pay | Admitting: Cardiovascular Disease

## 2011-04-27 ENCOUNTER — Ambulatory Visit (INDEPENDENT_AMBULATORY_CARE_PROVIDER_SITE_OTHER): Payer: Medicare Other | Admitting: Family Medicine

## 2011-04-27 DIAGNOSIS — E538 Deficiency of other specified B group vitamins: Secondary | ICD-10-CM

## 2011-04-27 MED ORDER — CYANOCOBALAMIN 1000 MCG/ML IJ SOLN
1000.0000 ug | Freq: Once | INTRAMUSCULAR | Status: AC
  Administered 2011-04-27: 1000 ug via INTRAMUSCULAR

## 2011-04-27 NOTE — Progress Notes (Signed)
Pt was in today for Vit B12 injection which was given IM in left deltoid.

## 2011-04-27 NOTE — Progress Notes (Signed)
  Subjective:    Patient ID: Bill Walker, male    DOB: February 27, 1928, 77 y.o.   MRN: [id number]  HPI inj only Review of Systems     Objective:   Physical Exam        Assessment & Plan:

## 2011-05-08 ENCOUNTER — Other Ambulatory Visit: Payer: Self-pay | Admitting: *Deleted

## 2011-05-08 MED ORDER — METOPROLOL SUCCINATE ER 100 MG PO TB24: 100.0000 mg | ORAL_TABLET | Freq: Every day | ORAL | Status: DC

## 2011-05-18 ENCOUNTER — Other Ambulatory Visit: Payer: Self-pay | Admitting: *Deleted

## 2011-05-18 MED ORDER — ISOSORBIDE MONONITRATE ER 30 MG PO TB24: 30.0000 mg | ORAL_TABLET | Freq: Every day | ORAL | Status: DC

## 2011-05-24 ENCOUNTER — Other Ambulatory Visit: Payer: Self-pay | Admitting: *Deleted

## 2011-05-24 MED ORDER — ISOSORBIDE MONONITRATE ER 30 MG PO TB24: ORAL_TABLET | ORAL | Status: DC

## 2011-05-31 ENCOUNTER — Other Ambulatory Visit: Payer: Self-pay | Admitting: *Deleted

## 2011-05-31 MED ORDER — SIMVASTATIN 20 MG PO TABS: 20.0000 mg | ORAL_TABLET | Freq: Every day | ORAL | Status: DC

## 2011-06-01 ENCOUNTER — Ambulatory Visit (INDEPENDENT_AMBULATORY_CARE_PROVIDER_SITE_OTHER): Payer: Medicare Other | Admitting: Family Medicine

## 2011-06-01 DIAGNOSIS — E538 Deficiency of other specified B group vitamins: Secondary | ICD-10-CM

## 2011-06-01 MED ORDER — CYANOCOBALAMIN 1000 MCG/ML IJ SOLN
1000.0000 ug | Freq: Once | INTRAMUSCULAR | Status: AC
  Administered 2011-06-01: 1000 ug via INTRAMUSCULAR

## 2011-06-01 NOTE — Progress Notes (Signed)
  Subjective:    Patient ID: Bill Walker, male    DOB: 12-14-1927, 77 y.o.   MRN: [id number]  HPI   Here for injection only Review of Systems     Objective:   Physical Exam        Assessment & Plan:

## 2011-06-02 ENCOUNTER — Telehealth: Payer: Self-pay | Admitting: Cardiovascular Disease

## 2011-06-02 MED ORDER — ISOSORBIDE MONONITRATE ER 30 MG PO TB24: ORAL_TABLET | ORAL | Status: DC

## 2011-06-02 NOTE — Telephone Encounter (Signed)
Patient wants a prescription for Imdur 90 day supply instead of 30,.  Cost the same for the patient.  [facility].  The phar stated they have asked for but it needs to specify the exact dosage per day.

## 2011-06-21 ENCOUNTER — Other Ambulatory Visit: Payer: Self-pay | Admitting: *Deleted

## 2011-06-21 MED ORDER — LOSARTAN POTASSIUM 50 MG PO TABS: 50.0000 mg | ORAL_TABLET | Freq: Two times a day (BID) | ORAL | Status: DC

## 2011-06-22 ENCOUNTER — Other Ambulatory Visit: Payer: Self-pay | Admitting: *Deleted

## 2011-06-22 MED ORDER — LOSARTAN POTASSIUM 50 MG PO TABS: 50.0000 mg | ORAL_TABLET | Freq: Two times a day (BID) | ORAL | Status: DC

## 2011-07-06 ENCOUNTER — Ambulatory Visit (INDEPENDENT_AMBULATORY_CARE_PROVIDER_SITE_OTHER): Payer: Medicare Other | Admitting: *Deleted

## 2011-07-06 DIAGNOSIS — E538 Deficiency of other specified B group vitamins: Secondary | ICD-10-CM

## 2011-07-06 MED ORDER — CYANOCOBALAMIN 1000 MCG/ML IJ SOLN
1000.0000 ug | Freq: Once | INTRAMUSCULAR | Status: AC
  Administered 2011-07-06: 1000 ug via INTRAMUSCULAR

## 2011-07-07 ENCOUNTER — Ambulatory Visit: Payer: Medicare Other

## 2011-08-08 ENCOUNTER — Ambulatory Visit (INDEPENDENT_AMBULATORY_CARE_PROVIDER_SITE_OTHER): Payer: Medicare Other | Admitting: *Deleted

## 2011-08-08 DIAGNOSIS — E538 Deficiency of other specified B group vitamins: Secondary | ICD-10-CM

## 2011-08-08 DIAGNOSIS — Z23 Encounter for immunization: Secondary | ICD-10-CM

## 2011-08-08 MED ORDER — CYANOCOBALAMIN 1000 MCG/ML IJ SOLN
1000.0000 ug | Freq: Once | INTRAMUSCULAR | Status: AC
  Administered 2011-08-08: 1000 ug via INTRAMUSCULAR

## 2011-08-17 ENCOUNTER — Encounter: Payer: Self-pay | Admitting: Cardiovascular Disease

## 2011-08-17 ENCOUNTER — Ambulatory Visit (INDEPENDENT_AMBULATORY_CARE_PROVIDER_SITE_OTHER): Payer: Medicare Other | Admitting: Cardiovascular Disease

## 2011-08-17 DIAGNOSIS — E7800 Pure hypercholesterolemia, unspecified: Secondary | ICD-10-CM

## 2011-08-17 DIAGNOSIS — I10 Essential (primary) hypertension: Secondary | ICD-10-CM

## 2011-08-17 DIAGNOSIS — I279 Pulmonary heart disease, unspecified: Secondary | ICD-10-CM

## 2011-08-17 DIAGNOSIS — I2510 Atherosclerotic heart disease of native coronary artery without angina pectoris: Secondary | ICD-10-CM

## 2011-08-17 MED ORDER — NITROGLYCERIN 0.4 MG SL SUBL: 0.4000 mg | SUBLINGUAL_TABLET | SUBLINGUAL | Status: DC | PRN

## 2011-08-17 NOTE — Assessment & Plan Note (Signed)
Stable with no angina and good activity level.  Continue medical Rx Not an operative candidate.  Refill for nitro called in

## 2011-08-17 NOTE — Patient Instructions (Signed)
Your physician wants you to follow-up in:  6 MONTHS WITH DR NISHAN  You will receive a reminder letter in the mail two months in advance. If you don't receive a letter, please call our office to schedule the follow-up appointment. Your physician recommends that you continue on your current medications as directed. Please refer to the Current Medication list given to you today. 

## 2011-08-17 NOTE — Assessment & Plan Note (Signed)
Well controlled.  Continue current medications and low sodium Dash type diet.    

## 2011-08-17 NOTE — Assessment & Plan Note (Signed)
Cholesterol is at goal.  Continue current dose of statin and diet Rx.  No myalgias or side effects.  F/U  LFT's in 6 months. Lab Results  Component Value Date   LDLCALC  Value: 47        Total Cholesterol/HDL:CHD Risk Coronary Heart Disease Risk Table                     Men   Women  1/2 Average Risk   3.4   3.3  Average Risk       5.0   4.4  2 X Average Risk   9.6   7.1  3 X Average Risk  23.4   11.0        Use the calculated Patient Ratio above and the CHD Risk Table to determine the patient's CHD Risk.        ATP III CLASSIFICATION (LDL):  <100     mg/dL   Optimal  409-811  mg/dL   Near or Above                    Optimal  130-159  mg/dL   Borderline  914-782  mg/dL   High  >956     mg/dL   Very High 12/27/863

## 2011-08-17 NOTE — Assessment & Plan Note (Signed)
F/U Wert.  Continue continuous 2L/[place].  Had flu shot

## 2011-08-17 NOTE — Progress Notes (Signed)
Bill Walker is seen today for F/U of CAD.  In hospital 4/12 and 5/12. He was cathed by Dr Riley Kill 5/11 and had 3VD. I reviewed his cath films and agree that his lesions are not amenable to PCI and he is not a CABG candidate due to his age and advanced oxygen dependant COPD. In hospital 4/12 hospital with respitory failure and pneumonia 2/12 Hospice involved with care Since D/C he has not had SSCP His activity is limited by dypnea. The heat in particular bothers him. He has been compliant with his meds and had is sl nitro with him today. He has had some increasing LE edema. His wife has taken the salt shaker away. He has not needed any nitro.   Wants to golf a bit and this is ok  Had flu shot.  Sees Wert for COPD.  Bruises easily  ROS: Denies fever, malais, weight loss, blurry vision, decreased visual acuity, cough, sputum, SOB, hemoptysis, pleuritic pain, palpitaitons, heartburn, abdominal pain, melena, lower extremity edema, claudication, or rash.  All other systems reviewed and negative  General: Affect appropriate Chronically ill appears stated age HEENT: normal Neck supple with no adenopathy JVP normal no bruits no thyromegaly Lungs poor breath sounds with  no wheezing and good diaphragmatic motion Heart:  S1/S2 no murmur,rub, gallop or click PMI normal Abdomen: benighn, BS positve, no tenderness, no AAA no bruit.  No HSM or HJR Distal pulses intact with no bruits No edema Neuro non-focal Skin warm and dry echymosis RUE from fall No muscular weakness   Current Outpatient Prescriptions  Medication Sig Dispense Refill  . allopurinol (ZYLOPRIM) 300 MG tablet Take 300 mg by mouth daily.        Marland Kitchen aspirin 325 MG tablet Take 325 mg by mouth daily.        . clopidogrel (PLAVIX) 75 MG tablet Take 1 tablet (75 mg total) by mouth daily.  30 tablet  11  . Cyanocobalamin (VITAMIN B-12 IJ) Inject as directed. monthly       . furosemide (LASIX) 80 MG tablet Take 80 mg by mouth. 1 po daily and 1 and  1/2 on Mon, Wed, and Fri       . isosorbide mononitrate (IMDUR) 30 MG 24 hr tablet  2 po daily  180 tablet  3  . losartan (COZAAR) 50 MG tablet Take 1 tablet (50 mg total) by mouth 2 (two) times daily.  180 tablet  3  . metoprolol (TOPROL-XL) 100 MG 24 hr tablet Take 1 tablet (100 mg total) by mouth daily.  90 tablet  3  . NITROSTAT 0.4 MG SL tablet TAKE 1 TABLET UNDER THE TONGUE EVERY 5 MINUTES FOR 3 DOSES AS NEEDED FOR CHEST PAIN  25 tablet  0  . NON FORMULARY Oxygen- 2 liters every 24 hours       . simvastatin (ZOCOR) 20 MG tablet Take 1 tablet (20 mg total) by mouth at bedtime.  90 tablet  2  . tiotropium (SPIRIVA) 18 MCG inhalation capsule Place 1 capsule (18 mcg total) into inhaler and inhale daily.  30 capsule  11    Allergies  Doxazosin mesylate and Penicillins  Electrocardiogram:  Assessment and Plan

## 2011-08-29 ENCOUNTER — Telehealth: Payer: Self-pay | Admitting: Cardiovascular Disease

## 2011-08-29 NOTE — Telephone Encounter (Signed)
All Cardiac faxed to [facility] @ [phone]  08/29/11/km

## 2011-09-07 ENCOUNTER — Encounter: Payer: Self-pay | Admitting: Family Medicine

## 2011-09-08 ENCOUNTER — Encounter: Payer: Self-pay | Admitting: Family Medicine

## 2011-09-08 ENCOUNTER — Ambulatory Visit (INDEPENDENT_AMBULATORY_CARE_PROVIDER_SITE_OTHER): Payer: Medicare Other | Admitting: Family Medicine

## 2011-09-08 VITALS — BP 114/66 | HR 80 | Temp 97.5°F | Ht 71.0 in | Wt 251.2 lb

## 2011-09-08 DIAGNOSIS — E785 Hyperlipidemia, unspecified: Secondary | ICD-10-CM

## 2011-09-08 DIAGNOSIS — I10 Essential (primary) hypertension: Secondary | ICD-10-CM

## 2011-09-08 DIAGNOSIS — N259 Disorder resulting from impaired renal tubular function, unspecified: Secondary | ICD-10-CM

## 2011-09-08 DIAGNOSIS — D649 Anemia, unspecified: Secondary | ICD-10-CM

## 2011-09-08 MED ORDER — CYANOCOBALAMIN 1000 MCG/ML IJ SOLN
1000.0000 ug | Freq: Once | INTRAMUSCULAR | Status: AC
  Administered 2011-09-08: 1000 ug via INTRAMUSCULAR

## 2011-09-08 NOTE — Patient Instructions (Signed)
I'm glad you are doing well  Since you are anemic - please do stool card for [place] and send it back If you have abdominal pain or blood in stool let me know Please schedule fasting labs for cholesterol and iron when you check out

## 2011-09-08 NOTE — Progress Notes (Signed)
Subjective:    Patient ID: Bill Walker, male    DOB: Oct 01, 1928, 77 y.o.   MRN: [id number]  HPI Here for f/u of anemia and other health problems incl renal insuff and HTN and lipids  Is doing pretty well overall  Had cataract removed on Tuesday   Had his flu shot   Breathing is about normal for him on 02   Got labs from Dr Kellie Simmering and HB is 10.9 down from 12.3 last check (was 9.9 in feb) No abdominal pain  No blood in his stool or black stool  Normal in terms of balance and energy   Platelets 131- not bad for him  Cr is 1.4 (down from 1.5) He does see a renal doctor -- kidney function is ok for his age -- he is told by Dr Allena Katz  Has never had a shot for anemia  Ca is slt low  Next visit - she thinks is due in December    He takes vit B12 inj-- stilll taking that without problems   On zyloprim for gout and uric acid is well controlled  No gout problems   HTN in good control 114/66 Is on a lot of medicine -this worries his wife  He sees cardiology   Need cholesterol checked -- but just ate eggs and sausage (he does not watch diet )  No cp or new sob  Patient Active Problem List  Diagnoses  . VITAMIN B12 DEFICIENCY  . PURE HYPERCHOLESTEROLEMIA  . HYPERLIPIDEMIA  . GOUT  . ANEMIA-NOS  . THROMBOCYTOPENIA  . ALCOHOL ABUSE  . TOBACCO ABUSE  . HEARING LOSS  . HYPERTENSION  . MI  . CORONARY ARTERY DISEASE  . COR PULMONALE  . CARDIOMYOPATHY  . COPD  . DIVERTICULOSIS, COLON  . RENAL INSUFFICIENCY  . OSTEOARTHRITIS  . FATIGUE  . EDEMA  . URGENCY OF URINATION  . PACER/LEAD MECH. COMPLICATION  . Leg skin lesion, right   Past Medical History  Diagnosis Date  . Cardiomyopathy   . Hypertension   . Coronary artery disease   . Heart attack   . Hyperlipidemia   . Vitamin B12 deficiency   . Pneumonia   . Thrombocytopenia   . Cor pulmonale   . Hearing loss   . Alcohol abuse, unspecified   . Tobacco use disorder   . Renal insufficiency   . Osteoarthritis     . Gout   . Diverticulosis of colon   . COPD (chronic obstructive pulmonary disease)   . Anemia   . Hypercholesterolemia   . Bronchiectasis     CT Chest 07/07/09 bilateral lower lobe medial bronchiectasis  . CHF (congestive heart failure)    Past Surgical History  Procedure Date  . Cardiac catheterization     (catheterization 2006 demonstrated an   occluded LAD.  There is a proximal stent in the RCA which was   patent.  He had distal 75% and 95% RCA lesions.  Initially it was  thought that he would have PCI but he was managed medically).  . Mrils     disk disease 06/1999  . Esophagogastroduodenoscopy     gastritis 12/1999   History  Substance Use Topics  . Smoking status: Former Smoker -- 1.0 packs/day for 48 years    Quit date: 11/13/1996  . Smokeless tobacco: Not on file  . Alcohol Use: No   Family History  Problem Relation Age of Onset  . Lymphoma Brother    Allergies  Allergen Reactions  .  Doxazosin Mesylate     REACTION: unknown reaction  . Penicillins     REACTION: unknown reaction   Current Outpatient Prescriptions on File Prior to Visit  Medication Sig Dispense Refill  . allopurinol (ZYLOPRIM) 300 MG tablet Take 300 mg by mouth daily.        Marland Kitchen aspirin 325 MG tablet Take 325 mg by mouth daily.        . clopidogrel (PLAVIX) 75 MG tablet Take 1 tablet (75 mg total) by mouth daily.  30 tablet  11  . Cyanocobalamin (VITAMIN B-12 IJ) Inject as directed. monthly       . furosemide (LASIX) 80 MG tablet Take 80 mg by mouth. 1 po daily and 1 and 1/2 on Mon, Wed, and Fri       . isosorbide mononitrate (IMDUR) 30 MG 24 hr tablet  2 po daily  180 tablet  3  . losartan (COZAAR) 50 MG tablet Take 1 tablet (50 mg total) by mouth 2 (two) times daily.  180 tablet  3  . metoprolol (TOPROL-XL) 100 MG 24 hr tablet Take 1 tablet (100 mg total) by mouth daily.  90 tablet  3  . NON FORMULARY Oxygen- 2 liters every 24 hours       . simvastatin (ZOCOR) 20 MG tablet Take 1 tablet (20 mg  total) by mouth at bedtime.  90 tablet  2  . tiotropium (SPIRIVA) 18 MCG inhalation capsule Place 1 capsule (18 mcg total) into inhaler and inhale daily.  30 capsule  11  . nitroGLYCERIN (NITROSTAT) 0.4 MG SL tablet Place 1 tablet (0.4 mg total) under the tongue every 5 (five) minutes as needed for chest pain.  30 tablet  0       Review of Systems Review of Systems  Constitutional: Negative for fever, appetite change, fatigue and unexpected weight change.  Eyes: Negative for pain and visual disturbance.  Respiratory: Negative for cough and pos for baseline sob with copd  Cardiovascular: Negative for cp or palpitations    Gastrointestinal: Negative for nausea, diarrhea and constipation.  Genitourinary: Negative for urgency and frequency.  Skin: Negative for pallor or rash   Neurological: Negative for weakness, light-headedness, numbness and headaches.  Hematological: Negative for adenopathy. Does not bruise/bleed easily.  Psychiatric/Behavioral: Negative for dysphoric mood. The patient is not nervous/anxious.          Objective:   Physical Exam  Constitutional: He appears well-developed and well-nourished. No distress.       Well appearing elderly male who is hard of hearing, wearing 02 by nasal cannula   HENT:  Head: Normocephalic and atraumatic.       Quite hard of hearing   Eyes: Conjunctivae and EOM are normal. Pupils are equal, round, and reactive to light.  Neck: Normal range of motion. Neck supple.  Cardiovascular: Normal rate, regular rhythm and normal heart sounds.   Pulmonary/Chest: Effort normal and breath sounds normal. No respiratory distress. He has no wheezes.       Diffusely distant bs   Abdominal: Soft. Bowel sounds are normal. He exhibits no distension. There is no tenderness.  Musculoskeletal: He exhibits edema.       Trace edema pedal - improved   Neurological: He is alert. He has normal reflexes. He exhibits normal muscle tone. Coordination normal.  Skin:  Skin is warm and dry. No pallor.       Rosacea apparent   Psychiatric: He has a normal mood and affect.  Assessment & Plan:

## 2011-09-10 NOTE — Assessment & Plan Note (Signed)
Pt has had intermittent anemia - multifactorial with renal insuff/ chronic dz and past GI bleed from diverticulosis No GI symptoms whatsoever Also on allopurinol Will do stool card today F/u for iron studies with his fasting labs

## 2011-09-10 NOTE — Assessment & Plan Note (Signed)
Labs on zocor planned fasting  Pt does not watch his diet and honsestly does not plan to  Rev the importance of cholesterol control

## 2011-09-10 NOTE — Assessment & Plan Note (Signed)
With anemia - ? If related  Multifactorial in elderly male with chronic dz Labs pending Is followed by renal

## 2011-09-10 NOTE — Assessment & Plan Note (Signed)
No change in meds- bp is in good control  Fasting labs planned

## 2011-09-11 ENCOUNTER — Other Ambulatory Visit (INDEPENDENT_AMBULATORY_CARE_PROVIDER_SITE_OTHER): Payer: Medicare Other

## 2011-09-11 DIAGNOSIS — D649 Anemia, unspecified: Secondary | ICD-10-CM

## 2011-09-11 DIAGNOSIS — I10 Essential (primary) hypertension: Secondary | ICD-10-CM

## 2011-09-11 DIAGNOSIS — E785 Hyperlipidemia, unspecified: Secondary | ICD-10-CM

## 2011-09-11 DIAGNOSIS — N259 Disorder resulting from impaired renal tubular function, unspecified: Secondary | ICD-10-CM

## 2011-09-11 LAB — LIPID PANEL
LDL Cholesterol: 29 mg/dL (ref 0–99)
Total CHOL/HDL Ratio: 2
VLDL: 8.2 mg/dL (ref 0.0–40.0)

## 2011-09-11 LAB — COMPREHENSIVE METABOLIC PANEL
ALT: 8 U/L (ref 0–53)
AST: 14 U/L (ref 0–37)
Albumin: 3.6 g/dL (ref 3.5–5.2)
Alkaline Phosphatase: 79 U/L (ref 39–117)
Calcium: 8.8 mg/dL (ref 8.4–10.5)
Chloride: 103 mEq/L (ref 96–112)
Potassium: 5 mEq/L (ref 3.5–5.1)
Sodium: 141 mEq/L (ref 135–145)

## 2011-09-11 LAB — IBC PANEL
Saturation Ratios: 18.1 % — ABNORMAL LOW (ref 20.0–50.0)
Transferrin: 248.1 mg/dL (ref 212.0–360.0)

## 2011-09-11 LAB — HEMOGLOBIN: Hemoglobin: 11.8 g/dL — ABNORMAL LOW (ref 13.0–17.0)

## 2011-10-10 ENCOUNTER — Encounter: Payer: Self-pay | Admitting: Cardiovascular Disease

## 2011-10-10 ENCOUNTER — Ambulatory Visit (INDEPENDENT_AMBULATORY_CARE_PROVIDER_SITE_OTHER): Payer: Medicare Other

## 2011-10-10 DIAGNOSIS — E538 Deficiency of other specified B group vitamins: Secondary | ICD-10-CM

## 2011-10-10 MED ORDER — CYANOCOBALAMIN 1000 MCG/ML IJ SOLN
1000.0000 ug | Freq: Once | INTRAMUSCULAR | Status: AC
  Administered 2011-10-10: 1000 ug via INTRAMUSCULAR

## 2011-10-10 NOTE — Telephone Encounter (Signed)
This encounter was created in error - please disregard.

## 2011-11-04 ENCOUNTER — Telehealth: Payer: Self-pay | Admitting: Oncology

## 2011-11-04 NOTE — Telephone Encounter (Signed)
Talked to pt , gave pt her appt for 12/06/11 , lab and md

## 2011-11-09 ENCOUNTER — Ambulatory Visit (INDEPENDENT_AMBULATORY_CARE_PROVIDER_SITE_OTHER): Payer: Medicare Other | Admitting: *Deleted

## 2011-11-09 DIAGNOSIS — E538 Deficiency of other specified B group vitamins: Secondary | ICD-10-CM

## 2011-11-09 MED ORDER — CYANOCOBALAMIN 1000 MCG/ML IJ SOLN
1000.0000 ug | Freq: Once | INTRAMUSCULAR | Status: AC
  Administered 2011-11-09: 1000 ug via INTRAMUSCULAR

## 2011-11-13 ENCOUNTER — Telehealth: Payer: Self-pay | Admitting: Radiology

## 2011-11-13 NOTE — Telephone Encounter (Signed)
Thanks for the heads up so I will not expect it

## 2011-11-13 NOTE — Telephone Encounter (Signed)
[facility] notified us that the patient never returned their ifob test, patient was called and wont be doing the test, per wife.

## 2011-11-15 ENCOUNTER — Telehealth: Payer: Self-pay | Admitting: Oncology

## 2011-11-15 NOTE — Telephone Encounter (Signed)
pt rtn call and confirmed appts for 01/23

## 2011-12-06 ENCOUNTER — Ambulatory Visit (HOSPITAL_BASED_OUTPATIENT_CLINIC_OR_DEPARTMENT_OTHER): Payer: Medicare Other | Admitting: Oncology

## 2011-12-06 ENCOUNTER — Other Ambulatory Visit (HOSPITAL_BASED_OUTPATIENT_CLINIC_OR_DEPARTMENT_OTHER): Payer: Medicare Other | Admitting: Lab

## 2011-12-06 VITALS — BP 131/71 | HR 91 | Temp 97.6°F | Ht 71.0 in | Wt 240.8 lb

## 2011-12-06 DIAGNOSIS — D696 Thrombocytopenia, unspecified: Secondary | ICD-10-CM

## 2011-12-06 DIAGNOSIS — D649 Anemia, unspecified: Secondary | ICD-10-CM

## 2011-12-06 DIAGNOSIS — D61818 Other pancytopenia: Secondary | ICD-10-CM

## 2011-12-06 DIAGNOSIS — E538 Deficiency of other specified B group vitamins: Secondary | ICD-10-CM

## 2011-12-06 DIAGNOSIS — D72819 Decreased white blood cell count, unspecified: Secondary | ICD-10-CM

## 2011-12-06 LAB — CBC WITH DIFFERENTIAL/PLATELET
BASO%: 0.4 % (ref 0.0–2.0)
EOS%: 1.9 % (ref 0.0–7.0)
HCT: 33.5 % — ABNORMAL LOW (ref 38.4–49.9)
LYMPH%: 16.8 % (ref 14.0–49.0)
MCH: 32.1 pg (ref 27.2–33.4)
MCHC: 33.2 g/dL (ref 32.0–36.0)
NEUT%: 73.8 % (ref 39.0–75.0)
lymph#: 1 10*3/uL (ref 0.9–3.3)

## 2011-12-06 NOTE — Progress Notes (Signed)
OFFICE PROGRESS NOTE  CC  Roxy Manns, MD, MD [address] [place] [address] [place] [address]  DIAGNOSIS: 78 year old gentleman with thrombocytopenia anemia and with B12 deficiency.  PRIOR THERAPY:  #1 patient has been on observation for the leukopenia and from cytopenia.  #24 anemia patient has been on B12 injections and iron as needed.  CURRENT THERAPY:Patient is getting B12 injections on a monthly basis by Dr. Vinnie Langton  INTERVAL HISTORY: Yovanni Frenette 78 y.o. male returns for Followup visit at one year. Overall he is doing well. His wife does tell me that last year in favorite 2012 he developed renal failure mental status changes. Eventually had a 2 week hospitalization and thereafter he was referred to [facility]. After one week of stay at [facility] patient recovered from his situation and he has done very well without any recurrence of any problems. Today he denies any fevers chills night sweats headaches shortness of breath chest pains palpitations he has no myalgias or arthralgias. He is on oxygen his levels remain pretty stable. Remainder of the 10 point review of systems is negative.  MEDICAL HISTORY: Past Medical History  Diagnosis Date  . Cardiomyopathy   . Hypertension   . Coronary artery disease   . Heart attack   . Hyperlipidemia   . Vitamin B12 deficiency   . Pneumonia   . Thrombocytopenia   . Cor pulmonale   . Hearing loss   . Alcohol abuse, unspecified   . Tobacco use disorder   . Renal insufficiency   . Osteoarthritis   . Gout   . Diverticulosis of colon   . COPD (chronic obstructive pulmonary disease)   . Anemia   . Hypercholesterolemia   . Bronchiectasis     CT Chest 07/07/09 bilateral lower lobe medial bronchiectasis  . CHF (congestive heart failure)     ALLERGIES:  is allergic to doxazosin mesylate and penicillins.  MEDICATIONS:  Current Outpatient Prescriptions  Medication Sig Dispense Refill  . allopurinol  (ZYLOPRIM) 300 MG tablet Take 300 mg by mouth daily.        Marland Kitchen aspirin 325 MG tablet Take 325 mg by mouth daily.        . cholecalciferol (VITAMIN D) 1000 UNITS tablet Take 1,000 Units by mouth 2 (two) times daily.      . clopidogrel (PLAVIX) 75 MG tablet Take 1 tablet (75 mg total) by mouth daily.  30 tablet  11  . Cyanocobalamin (VITAMIN B-12 IJ) Inject as directed. monthly       . furosemide (LASIX) 80 MG tablet TAKE 1 TABLET EVERY MORNINGAND TAKE 1/2 TABLET IN THE EVENING ON DAYSMONDAY,    WEDNESDAY, AND FRIDAY  109 tablet  3  . isosorbide mononitrate (IMDUR) 30 MG 24 hr tablet  2 po daily  180 tablet  3  . losartan (COZAAR) 50 MG tablet Take 1 tablet (50 mg total) by mouth 2 (two) times daily.  180 tablet  3  . metoprolol (TOPROL-XL) 100 MG 24 hr tablet Take 1 tablet (100 mg total) by mouth daily.  90 tablet  3  . nitroGLYCERIN (NITROSTAT) 0.4 MG SL tablet Place 1 tablet (0.4 mg total) under the tongue every 5 (five) minutes as needed for chest pain.  30 tablet  0  . NON FORMULARY Oxygen- 2 liters every 24 hours       . simvastatin (ZOCOR) 20 MG tablet Take 1 tablet (20 mg total) by mouth at bedtime.  90 tablet  2  . tiotropium (SPIRIVA) 18 MCG inhalation capsule Place 1 capsule (18 mcg total) into inhaler and inhale daily.  30 capsule  11    SURGICAL HISTORY:  Past Surgical History  Procedure Date  . Cardiac catheterization     (catheterization 2006 demonstrated an   occluded LAD.  There is a proximal stent in the RCA which was   patent.  He had distal 75% and 95% RCA lesions.  Initially it was  thought that he would have PCI but he was managed medically).  . Mrils     disk disease 06/1999  . Esophagogastroduodenoscopy     gastritis 12/1999    REVIEW OF SYSTEMS:  Pertinent items are noted in HPI.   PHYSICAL EXAMINATION: General appearance: alert, cooperative and appears stated age Resp: clear to auscultation bilaterally Cardio: regular rate and rhythm, S1, S2 normal, no murmur,  click, rub or gallop GI: soft, non-tender; bowel sounds normal; no masses,  no organomegaly Extremities: no edema, redness or tenderness in the calves or thighs and Bilateral lower extremity edema +2 Neurologic: Alert and oriented X 3, normal strength and tone. Normal symmetric reflexes. Normal coordination and gait  ECOG PERFORMANCE STATUS: 1 - Symptomatic but completely ambulatory  Blood pressure 131/71, pulse 91, temperature 97.6 F (36.4 C), temperature source Oral, height 5\' 11"  (1.803 m), weight 240 lb 12.8 oz (109.226 kg).  LABORATORY DATA: Lab Results  Component Value Date   WBC 6.1 12/06/2011   HGB 11.1* 12/06/2011   HCT 33.5* 12/06/2011   MCV 96.6 12/06/2011   PLT 134* 12/06/2011      Chemistry      Component Value Date/Time   NA 141 09/11/2011 0841   NA 145 12/16/2009 1105   K 5.0 09/11/2011 0841   K 4.2 12/16/2009 1105   CL 103 09/11/2011 0841   CL 102 12/16/2009 1105   CO2 31 09/11/2011 0841   CO2 35* 12/16/2009 1105   BUN 45* 09/11/2011 0841   BUN 27* 12/16/2009 1105   CREATININE 1.6* 09/11/2011 0841   CREATININE 1.7* 12/16/2009 1105      Component Value Date/Time   CALCIUM 8.8 09/11/2011 0841   CALCIUM 8.5 12/16/2009 1105   ALKPHOS 79 09/11/2011 0841   ALKPHOS 61 03/17/2009 1301   AST 14 09/11/2011 0841   AST 21 03/17/2009 1301   ALT 8 09/11/2011 0841   BILITOT 0.4 09/11/2011 0841   BILITOT 0.80 03/17/2009 1301       RADIOGRAPHIC STUDIES:  No results found.  ASSESSMENT: 78 year old gentleman with mild pancytopenia. Patient is anemic with B12 deficiency. He has been on monthly B12 injections at Dr. Lucretia Roers office. He is tolerating it well. His hemoglobin today is 11.1 very much acceptable he remains asymptomatic and we will continue to follow him.   PLAN: Patient will be seen back in one years time for followup   All questions were answered. The patient knows to call the clinic with any problems, questions or concerns. We can certainly see the patient much sooner if  necessary.  I spent 20 minutes counseling the patient face to face. The total time spent in the appointment was 30 minutes.    Drue Second, MD Medical/Oncology [facility] [phone] (beeper) [phone] (Office)  12/06/2011, 4:07 PM

## 2011-12-14 ENCOUNTER — Ambulatory Visit (INDEPENDENT_AMBULATORY_CARE_PROVIDER_SITE_OTHER): Payer: Medicare Other

## 2011-12-14 DIAGNOSIS — E538 Deficiency of other specified B group vitamins: Secondary | ICD-10-CM

## 2011-12-14 MED ORDER — CYANOCOBALAMIN 1000 MCG/ML IJ SOLN
1000.0000 ug | Freq: Once | INTRAMUSCULAR | Status: AC
  Administered 2011-12-14: 1000 ug via INTRAMUSCULAR

## 2012-01-18 ENCOUNTER — Ambulatory Visit (INDEPENDENT_AMBULATORY_CARE_PROVIDER_SITE_OTHER): Payer: Medicare Other | Admitting: *Deleted

## 2012-01-18 DIAGNOSIS — E538 Deficiency of other specified B group vitamins: Secondary | ICD-10-CM

## 2012-01-18 MED ORDER — CYANOCOBALAMIN 1000 MCG/ML IJ SOLN
1000.0000 ug | Freq: Once | INTRAMUSCULAR | Status: AC
  Administered 2012-01-18: 1000 ug via INTRAMUSCULAR

## 2012-02-06 ENCOUNTER — Ambulatory Visit (INDEPENDENT_AMBULATORY_CARE_PROVIDER_SITE_OTHER): Payer: Medicare Other | Admitting: Family Medicine

## 2012-02-06 ENCOUNTER — Encounter: Payer: Self-pay | Admitting: Family Medicine

## 2012-02-06 VITALS — BP 142/82 | HR 64 | Temp 97.9°F | Ht 71.0 in | Wt 241.0 lb

## 2012-02-06 DIAGNOSIS — J209 Acute bronchitis, unspecified: Secondary | ICD-10-CM

## 2012-02-06 DIAGNOSIS — J449 Chronic obstructive pulmonary disease, unspecified: Secondary | ICD-10-CM

## 2012-02-06 DIAGNOSIS — J4489 Other specified chronic obstructive pulmonary disease: Secondary | ICD-10-CM

## 2012-02-06 MED ORDER — LEVOFLOXACIN 250 MG PO TABS: 250.0000 mg | ORAL_TABLET | Freq: Every day | ORAL | Status: AC

## 2012-02-06 MED ORDER — PREDNISONE 10 MG PO TABS: ORAL_TABLET | ORAL | Status: DC

## 2012-02-06 NOTE — Progress Notes (Signed)
Subjective:    Patient ID: Bill Walker, male    DOB: [birth year], 78 y.o.   MRN: [id number]  HPI Here for bronchitis symptoms and just cannot shake it  Has had it for 2 weeks  Started as a head cold an then went in to his chest   Has copd and on continuous 02 Does not think he has been wheezing  Is a little sob only if he walks  Does have allergies   No fever  Mostly clear mucous -- a little darker in the am  No headache , no throat pain  Is somewhat tired  No fever  No body aches or chills or sweats   Nasal symptoms are improved Still mild runny nose  No sinus pain or throat pain  Patient Active Problem List  Diagnoses  . VITAMIN B12 DEFICIENCY  . PURE HYPERCHOLESTEROLEMIA  . HYPERLIPIDEMIA  . GOUT  . ANEMIA-NOS  . THROMBOCYTOPENIA  . ALCOHOL ABUSE  . TOBACCO ABUSE  . HEARING LOSS  . HYPERTENSION  . MI  . CORONARY ARTERY DISEASE  . COR PULMONALE  . CARDIOMYOPATHY  . COPD  . DIVERTICULOSIS, COLON  . RENAL INSUFFICIENCY  . OSTEOARTHRITIS  . FATIGUE  . EDEMA  . URGENCY OF URINATION  . PACER/LEAD MECH. COMPLICATION  . Leg skin lesion, right  . Acute bronchitis with bronchospasm   Past Medical History  Diagnosis Date  . Cardiomyopathy   . Hypertension   . Coronary artery disease   . Heart attack   . Hyperlipidemia   . Vitamin B12 deficiency   . Pneumonia   . Thrombocytopenia   . Cor pulmonale   . Hearing loss   . Alcohol abuse, unspecified   . Tobacco use disorder   . Renal insufficiency   . Osteoarthritis   . Gout   . Diverticulosis of colon   . COPD (chronic obstructive pulmonary disease)   . Anemia   . Hypercholesterolemia   . Bronchiectasis     CT Chest 07/07/09 bilateral lower lobe medial bronchiectasis  . CHF (congestive heart failure)    Past Surgical History  Procedure Date  . Cardiac catheterization     (catheterization 2006 demonstrated an   occluded LAD.  There is a proximal stent in the RCA which was   patent.  He had distal 75%  and 95% RCA lesions.  Initially it was  thought that he would have PCI but he was managed medically).  . Mrils     disk disease 06/1999  . Esophagogastroduodenoscopy     gastritis 12/1999   History  Substance Use Topics  . Smoking status: Former Smoker -- 1.0 packs/day for 48 years    Quit date: 11/13/1996  . Smokeless tobacco: Not on file  . Alcohol Use: No   Family History  Problem Relation Age of Onset  . Lymphoma Brother    Allergies  Allergen Reactions  . Doxazosin Mesylate     REACTION: unknown reaction  . Penicillins     REACTION: unknown reaction   Current Outpatient Prescriptions on File Prior to Visit  Medication Sig Dispense Refill  . allopurinol (ZYLOPRIM) 300 MG tablet Take 300 mg by mouth daily.        Marland Kitchen aspirin 325 MG tablet Take 325 mg by mouth daily.        . cholecalciferol (VITAMIN D) 1000 UNITS tablet Take 1,000 Units by mouth 2 (two) times daily.      . clopidogrel (PLAVIX) 75 MG tablet Take  1 tablet (75 mg total) by mouth daily.  30 tablet  11  . Cyanocobalamin (VITAMIN B-12 IJ) Inject as directed. monthly       . furosemide (LASIX) 80 MG tablet TAKE 1 TABLET EVERY MORNINGAND TAKE 1/2 TABLET IN THE EVENING ON DAYSMONDAY,    WEDNESDAY, AND FRIDAY  109 tablet  3  . isosorbide mononitrate (IMDUR) 30 MG 24 hr tablet  2 po daily  180 tablet  3  . losartan (COZAAR) 50 MG tablet Take 1 tablet (50 mg total) by mouth 2 (two) times daily.  180 tablet  3  . metoprolol (TOPROL-XL) 100 MG 24 hr tablet Take 1 tablet (100 mg total) by mouth daily.  90 tablet  3  . nitroGLYCERIN (NITROSTAT) 0.4 MG SL tablet Place 1 tablet (0.4 mg total) under the tongue every 5 (five) minutes as needed for chest pain.  30 tablet  0  . NON FORMULARY Oxygen- 2 liters every 24 hours       . simvastatin (ZOCOR) 20 MG tablet Take 1 tablet (20 mg total) by mouth at bedtime.  90 tablet  2  . tiotropium (SPIRIVA) 18 MCG inhalation capsule Place 1 capsule (18 mcg total) into inhaler and inhale  daily.  30 capsule  11          Review of Systems Review of Systems  Constitutional: Negative for fever, appetite change, and unexpected weight change. pos for fatigue ENT pos for runny nose and mild cong, neg for sinus pain  Eyes: Negative for pain and visual disturbance.  Respiratory: pos for cough/ sob (mild wheeze) Cardiovascular: Negative for cp or palpitations    Gastrointestinal: Negative for nausea, diarrhea and constipation.  Genitourinary: Negative for urgency and frequency.  Skin: Negative for pallor or rash   Neurological: Negative for weakness, light-headedness, numbness and headaches.  Hematological: Negative for adenopathy. Does not bruise/bleed easily.  Psychiatric/Behavioral: Negative for dysphoric mood. The patient is not nervous/anxious.          Objective:   Physical Exam  Constitutional: He appears well-developed and well-nourished. No distress.  HENT:  Head: Normocephalic and atraumatic.  Right Ear: External ear normal.  Left Ear: External ear normal.  Mouth/Throat: Oropharynx is clear and moist. No oropharyngeal exudate.       Nares are boggy with clear rhinorrhea  Patent - wearing 02 by [place] Throat clear  No sinus tenderness  Eyes: Conjunctivae and EOM are normal. Pupils are equal, round, and reactive to light. Right eye exhibits no discharge. Left eye exhibits no discharge.  Neck: Normal range of motion. Neck supple. No JVD present. Carotid bruit is not present. No thyromegaly present.  Cardiovascular: Normal rate, regular rhythm and normal heart sounds.  Exam reveals no gallop.   Pulmonary/Chest: Effort normal. No respiratory distress. He has wheezes. He has no rales. He exhibits no tenderness.       No crackles Harsh/ distant bs throughout  Exp wheeze throughout that is mild  Fair air exch slt prolonged exp phase  Musculoskeletal: He exhibits no edema.  Lymphadenopathy:    He has no cervical adenopathy.  Neurological: He is alert.  Skin: Skin  is warm and dry. No rash noted. No erythema. No pallor.  Psychiatric: He has a normal mood and affect.          Assessment & Plan:

## 2012-02-06 NOTE — Patient Instructions (Signed)
Take the levaquin for bronchitis Drink fluids and get rest  Continue mucinex to help loosen congestion Take the prednisone taper as directed  Update if not starting to improve in a week or if worsening  - let me know especially if short of breath or wheezing

## 2012-02-06 NOTE — Assessment & Plan Note (Signed)
With exac of copd -not severe  Will cover with levaquin (renal dose) and update  Disc symptomatic care - see instructions on AVS  Also prednisone taper and continue 02  Update if not starting to improve in a week or if worsening

## 2012-02-06 NOTE — Assessment & Plan Note (Signed)
Acute exac from infectious bronchitis Cover with renal dosed levaquin in light of age and length of illness pred taper and update Continue 02 guifenesin for cong Update if not starting to improve in a week or if worsening

## 2012-02-19 ENCOUNTER — Encounter: Payer: Self-pay | Admitting: Cardiovascular Disease

## 2012-02-19 ENCOUNTER — Ambulatory Visit (INDEPENDENT_AMBULATORY_CARE_PROVIDER_SITE_OTHER): Payer: Medicare Other | Admitting: Cardiovascular Disease

## 2012-02-19 VITALS — BP 108/70 | HR 80 | Ht 71.0 in | Wt 237.8 lb

## 2012-02-19 DIAGNOSIS — E7800 Pure hypercholesterolemia, unspecified: Secondary | ICD-10-CM

## 2012-02-19 DIAGNOSIS — J449 Chronic obstructive pulmonary disease, unspecified: Secondary | ICD-10-CM

## 2012-02-19 DIAGNOSIS — I2510 Atherosclerotic heart disease of native coronary artery without angina pectoris: Secondary | ICD-10-CM

## 2012-02-19 DIAGNOSIS — D696 Thrombocytopenia, unspecified: Secondary | ICD-10-CM

## 2012-02-19 DIAGNOSIS — I10 Essential (primary) hypertension: Secondary | ICD-10-CM

## 2012-02-19 MED ORDER — NITROGLYCERIN 0.4 MG SL SUBL: 0.4000 mg | SUBLINGUAL_TABLET | SUBLINGUAL | Status: DC | PRN

## 2012-02-19 NOTE — Assessment & Plan Note (Signed)
Stable with no angina and good activity level.  Continue medical Rx Nitro called in 

## 2012-02-19 NOTE — Assessment & Plan Note (Signed)
Stable F/U Dr Welton Flakes.  Bruising but no other bleeding issues

## 2012-02-19 NOTE — Assessment & Plan Note (Signed)
Well controlled.  Continue current medications and low sodium Dash type diet.    

## 2012-02-19 NOTE — Patient Instructions (Signed)
Your physician wants you to follow-up in:  6 MONTHS WITH DR NISHAN  You will receive a reminder letter in the mail two months in advance. If you don't receive a letter, please call our office to schedule the follow-up appointment. Your physician recommends that you continue on your current medications as directed. Please refer to the Current Medication list given to you today. 

## 2012-02-19 NOTE — Progress Notes (Signed)
Bill Walker is seen today for F/U of CAD. In hospital 4/12 and 5/12. He was cathed by Dr Riley Kill 5/11 and had 3VD. I reviewed his cath films and agree that his lesions are not amenable to PCI and he is not a CABG candidate due to his age and advanced oxygen dependant COPD. In hospital 4/12 hospital with respitory failure and pneumonia 2/12 Hospice involved with care Since D/C he has not had SSCP His activity is limited by dypnea. The heat in particular bothers him. He has been compliant with his meds and had is sl nitro with him today. He has had some increasing LE edema. His wife has taken the salt shaker away. He has not needed any nitro.   Chronic thrombocytopenia sees Heme/Onc and gets B12 shorts for anemia  Needs a refill on nitro  . Sees Wert for COPD. Bruises easily  ROS: Denies fever, malais, weight loss, blurry vision, decreased visual acuity, cough, sputum, SOB, hemoptysis, pleuritic pain, palpitaitons, heartburn, abdominal pain, melena, lower extremity edema, claudication, or rash.  All other systems reviewed and negative  General: Affect appropriate Elderly COPDer on oxygen HEENT: normal Neck supple with no adenopathy JVP normal no bruits no thyromegaly Lungs clear with no wheezing and good diaphragmatic motion Heart:  S1/S2 no murmur, no rub, gallop or click PMI normal Abdomen: benighn, BS positve, no tenderness, no AAA no bruit.  No HSM or HJR Distal pulses intact with no bruits No edema Neuro non-focal Skin warm and dry easy bruising No muscular weakness   Current Outpatient Prescriptions  Medication Sig Dispense Refill  . allopurinol (ZYLOPRIM) 300 MG tablet Take 300 mg by mouth daily.        Marland Kitchen aspirin 325 MG tablet Take 325 mg by mouth daily.        . cholecalciferol (VITAMIN D) 1000 UNITS tablet Take 1,000 Units by mouth 2 (two) times daily.      . clopidogrel (PLAVIX) 75 MG tablet Take 1 tablet (75 mg total) by mouth daily.  30 tablet  11  . Cyanocobalamin (VITAMIN B-12  IJ) Inject as directed. monthly       . furosemide (LASIX) 80 MG tablet TAKE 1 TABLET EVERY MORNINGAND TAKE 1/2 TABLET IN THE EVENING ON DAYSMONDAY,    WEDNESDAY, AND FRIDAY  109 tablet  3  . isosorbide mononitrate (IMDUR) 30 MG 24 hr tablet  2 po daily  180 tablet  3  . losartan (COZAAR) 50 MG tablet Take 1 tablet (50 mg total) by mouth 2 (two) times daily.  180 tablet  3  . metoprolol (TOPROL-XL) 100 MG 24 hr tablet Take 1 tablet (100 mg total) by mouth daily.  90 tablet  3  . nitroGLYCERIN (NITROSTAT) 0.4 MG SL tablet Place 1 tablet (0.4 mg total) under the tongue every 5 (five) minutes as needed for chest pain.  30 tablet  0  . NON FORMULARY Oxygen- 2 liters every 24 hours       . simvastatin (ZOCOR) 20 MG tablet Take 1 tablet (20 mg total) by mouth at bedtime.  90 tablet  2  . tiotropium (SPIRIVA) 18 MCG inhalation capsule Place 1 capsule (18 mcg total) into inhaler and inhale daily.  30 capsule  11    Allergies  Doxazosin mesylate and Penicillins  Electrocardiogram:  Assessment and Plan

## 2012-02-19 NOTE — Assessment & Plan Note (Signed)
Cholesterol is at goal.  Continue current dose of statin and diet Rx.  No myalgias or side effects.  F/U  LFT's in 6 months. Lab Results  Component Value Date   LDLCALC 29 09/11/2011

## 2012-02-19 NOTE — Assessment & Plan Note (Signed)
Major quality of life issue. F/U Dr wert.  Continue oxygen

## 2012-02-20 ENCOUNTER — Ambulatory Visit (INDEPENDENT_AMBULATORY_CARE_PROVIDER_SITE_OTHER): Payer: Medicare Other | Admitting: *Deleted

## 2012-02-20 DIAGNOSIS — E538 Deficiency of other specified B group vitamins: Secondary | ICD-10-CM

## 2012-02-20 MED ORDER — CYANOCOBALAMIN 1000 MCG/ML IJ SOLN
1000.0000 ug | Freq: Once | INTRAMUSCULAR | Status: AC
  Administered 2012-02-20: 1000 ug via INTRAMUSCULAR

## 2012-02-25 ENCOUNTER — Other Ambulatory Visit: Payer: Self-pay | Admitting: Family Medicine

## 2012-03-22 ENCOUNTER — Ambulatory Visit (INDEPENDENT_AMBULATORY_CARE_PROVIDER_SITE_OTHER): Payer: Medicare Other | Admitting: *Deleted

## 2012-03-22 DIAGNOSIS — E538 Deficiency of other specified B group vitamins: Secondary | ICD-10-CM

## 2012-03-22 MED ORDER — CYANOCOBALAMIN 1000 MCG/ML IJ SOLN
1000.0000 ug | Freq: Once | INTRAMUSCULAR | Status: AC
  Administered 2012-03-22: 1000 ug via INTRAMUSCULAR

## 2012-03-26 ENCOUNTER — Other Ambulatory Visit: Payer: Self-pay | Admitting: Family Medicine

## 2012-04-15 ENCOUNTER — Other Ambulatory Visit: Payer: Self-pay | Admitting: Family Medicine

## 2012-04-26 ENCOUNTER — Ambulatory Visit (INDEPENDENT_AMBULATORY_CARE_PROVIDER_SITE_OTHER): Payer: Medicare Other

## 2012-04-26 DIAGNOSIS — E538 Deficiency of other specified B group vitamins: Secondary | ICD-10-CM

## 2012-04-26 MED ORDER — CYANOCOBALAMIN 1000 MCG/ML IJ SOLN
1000.0000 ug | Freq: Once | INTRAMUSCULAR | Status: AC
  Administered 2012-04-26: 1000 ug via INTRAMUSCULAR

## 2012-05-12 ENCOUNTER — Other Ambulatory Visit: Payer: Self-pay | Admitting: Family Medicine

## 2012-05-15 ENCOUNTER — Other Ambulatory Visit: Payer: Self-pay | Admitting: *Deleted

## 2012-05-15 MED ORDER — METOPROLOL SUCCINATE ER 100 MG PO TB24: 100.0000 mg | ORAL_TABLET | Freq: Every day | ORAL | Status: DC

## 2012-05-17 ENCOUNTER — Other Ambulatory Visit: Payer: Self-pay

## 2012-05-17 NOTE — Telephone Encounter (Signed)
Ms Bill Walker wanted to verify Toprol had been sent to [facility]. Sent on 05/15/12 to [facility]. Ms Bill Walker will ck with [facility].

## 2012-05-22 ENCOUNTER — Telehealth: Payer: Self-pay | Admitting: *Deleted

## 2012-05-22 NOTE — Telephone Encounter (Signed)
FYI  Patient' wife called stating that a refill was sent to her mail order pharmacy for Metoprolol last week and was only given #30 with 3 refills. Advised patient that our records show that the script was sent for #90 and the mistake must be from the pharmacy. Patient stated that she will call her mail order pharmacy and if they have any questions regarding the mistake she will have them call the office.

## 2012-05-22 NOTE — Telephone Encounter (Signed)
Ok- just let me know if I need to do anything, thanks

## 2012-05-31 ENCOUNTER — Ambulatory Visit (INDEPENDENT_AMBULATORY_CARE_PROVIDER_SITE_OTHER): Payer: Medicare Other

## 2012-05-31 DIAGNOSIS — E538 Deficiency of other specified B group vitamins: Secondary | ICD-10-CM

## 2012-05-31 MED ORDER — CYANOCOBALAMIN 1000 MCG/ML IJ SOLN
1000.0000 ug | Freq: Once | INTRAMUSCULAR | Status: AC
  Administered 2012-05-31: 1000 ug via INTRAMUSCULAR

## 2012-06-14 ENCOUNTER — Telehealth: Payer: Self-pay | Admitting: Family Medicine

## 2012-06-14 NOTE — Telephone Encounter (Signed)
Patient's wife advised

## 2012-06-14 NOTE — Telephone Encounter (Signed)
Patient was told he needs to come in for an office visit in October to get his medication refilled.  The first available for a cpx is December,so I scheduled him for a follow up appointment to refill medications.  His wife wanted to know if you want him to come in before the appointment on 08/28/12 for lab work.

## 2012-06-14 NOTE — Telephone Encounter (Signed)
No- he does not need to - we will talk at that visit and determine wheat needs to be done, thanks

## 2012-06-16 ENCOUNTER — Other Ambulatory Visit: Payer: Self-pay | Admitting: Cardiovascular Disease

## 2012-07-03 ENCOUNTER — Other Ambulatory Visit: Payer: Self-pay | Admitting: Family Medicine

## 2012-07-04 ENCOUNTER — Ambulatory Visit (INDEPENDENT_AMBULATORY_CARE_PROVIDER_SITE_OTHER): Payer: Medicare Other

## 2012-07-04 DIAGNOSIS — E538 Deficiency of other specified B group vitamins: Secondary | ICD-10-CM

## 2012-07-04 MED ORDER — CYANOCOBALAMIN 1000 MCG/ML IJ SOLN
1000.0000 ug | Freq: Once | INTRAMUSCULAR | Status: AC
  Administered 2012-07-04: 1000 ug via INTRAMUSCULAR

## 2012-07-06 ENCOUNTER — Other Ambulatory Visit: Payer: Self-pay | Admitting: Family Medicine

## 2012-07-08 ENCOUNTER — Other Ambulatory Visit: Payer: Self-pay

## 2012-07-08 MED ORDER — CLOPIDOGREL BISULFATE 75 MG PO TABS: 75.0000 mg | ORAL_TABLET | Freq: Every day | ORAL | Status: DC

## 2012-07-08 NOTE — Telephone Encounter (Signed)
Request for Clopidogrel  75 mg. Last OV 02/06/12. Last filled 04/15/12. Ok to refill?

## 2012-07-08 NOTE — Telephone Encounter (Signed)
Please go ahead and refil for the year-thanks

## 2012-08-06 ENCOUNTER — Other Ambulatory Visit: Payer: Self-pay | Admitting: Family Medicine

## 2012-08-06 ENCOUNTER — Other Ambulatory Visit: Payer: Self-pay | Admitting: *Deleted

## 2012-08-06 MED ORDER — METOPROLOL SUCCINATE ER 100 MG PO TB24: 100.0000 mg | ORAL_TABLET | Freq: Every day | ORAL | Status: DC

## 2012-08-08 ENCOUNTER — Telehealth: Payer: Self-pay

## 2012-08-08 ENCOUNTER — Ambulatory Visit (INDEPENDENT_AMBULATORY_CARE_PROVIDER_SITE_OTHER): Payer: Medicare Other

## 2012-08-08 DIAGNOSIS — E538 Deficiency of other specified B group vitamins: Secondary | ICD-10-CM

## 2012-08-08 MED ORDER — CYANOCOBALAMIN 1000 MCG/ML IJ SOLN
1000.0000 ug | Freq: Once | INTRAMUSCULAR | Status: AC
  Administered 2012-08-08: 1000 ug via INTRAMUSCULAR

## 2012-08-08 NOTE — Telephone Encounter (Signed)
Pt's wife concerned why got # 60 instead of # 90 for metoprolol. Pts had used refill # when requested metoprolol for # 60. Advised to get rid of # 60 bottle when pt finishes med so request will come # 90.Marland Kitchen Pts wife verbalized understanding.

## 2012-08-28 ENCOUNTER — Ambulatory Visit (INDEPENDENT_AMBULATORY_CARE_PROVIDER_SITE_OTHER): Payer: Medicare Other | Admitting: Family Medicine

## 2012-08-28 ENCOUNTER — Encounter: Payer: Self-pay | Admitting: Family Medicine

## 2012-08-28 VITALS — BP 132/66 | HR 78 | Temp 97.8°F | Ht 71.0 in | Wt 248.5 lb

## 2012-08-28 DIAGNOSIS — N259 Disorder resulting from impaired renal tubular function, unspecified: Secondary | ICD-10-CM

## 2012-08-28 DIAGNOSIS — E785 Hyperlipidemia, unspecified: Secondary | ICD-10-CM

## 2012-08-28 DIAGNOSIS — Z23 Encounter for immunization: Secondary | ICD-10-CM

## 2012-08-28 DIAGNOSIS — I10 Essential (primary) hypertension: Secondary | ICD-10-CM

## 2012-08-28 DIAGNOSIS — D649 Anemia, unspecified: Secondary | ICD-10-CM

## 2012-08-28 DIAGNOSIS — E7800 Pure hypercholesterolemia, unspecified: Secondary | ICD-10-CM

## 2012-08-28 DIAGNOSIS — M109 Gout, unspecified: Secondary | ICD-10-CM

## 2012-08-28 DIAGNOSIS — E538 Deficiency of other specified B group vitamins: Secondary | ICD-10-CM

## 2012-08-28 LAB — CBC WITH DIFFERENTIAL/PLATELET
Basophils Relative: 0.2 % (ref 0.0–3.0)
Eosinophils Absolute: 0.1 10*3/uL (ref 0.0–0.7)
HCT: 35.5 % — ABNORMAL LOW (ref 39.0–52.0)
Hemoglobin: 11.3 g/dL — ABNORMAL LOW (ref 13.0–17.0)
Lymphocytes Relative: 10.5 % — ABNORMAL LOW (ref 12.0–46.0)
MCHC: 31.9 g/dL (ref 30.0–36.0)
Monocytes Relative: 8.8 % (ref 3.0–12.0)
Neutro Abs: 5.8 10*3/uL (ref 1.4–7.7)
RBC: 3.59 Mil/uL — ABNORMAL LOW (ref 4.22–5.81)

## 2012-08-28 LAB — COMPREHENSIVE METABOLIC PANEL
BUN: 40 mg/dL — ABNORMAL HIGH (ref 6–23)
CO2: 37 mEq/L — ABNORMAL HIGH (ref 19–32)
Calcium: 9.1 mg/dL (ref 8.4–10.5)
Chloride: 99 mEq/L (ref 96–112)
Creatinine, Ser: 1.7 mg/dL — ABNORMAL HIGH (ref 0.4–1.5)
GFR: 40.86 mL/min — ABNORMAL LOW (ref >60.00)

## 2012-08-28 LAB — LIPID PANEL
Cholesterol: 109 mg/dL (ref 0–200)
Triglycerides: 53 mg/dL (ref 0.0–149.0)

## 2012-08-28 NOTE — Assessment & Plan Note (Signed)
bp in fair control at this time  No changes needed  Disc lifstyle change with low sodium diet and exercise  Lab today 

## 2012-08-28 NOTE — Patient Instructions (Addendum)
Labs today  Flu shot today  I will send copy to Dr Kellie Simmering Try to eat a healthy diet

## 2012-08-28 NOTE — Assessment & Plan Note (Signed)
Chronic dz/ renal insuff  On allopurinol Has seen heme Cbc today

## 2012-08-28 NOTE — Progress Notes (Signed)
Subjective:    Patient ID: Bill Walker, male    DOB: July 29, 1928, 78 y.o.   MRN: [id number]  HPI Here for chronic health problems   Is feeling good overall  Need labs - need to send labs to rheumatology    Wt is up 11 lb with bmi of 34 Last wt check he was sick - then gained the weight back  No new edema  No new heart issues - is due soon for a check in with cardiologist   bp is stable today  No cp or palpitations or headaches or edema  No side effects to medicines  BP Readings from Last 3 Encounters:  08/28/12 132/66  02/19/12 108/70  02/06/12 142/82      Hyperlipidemia Lab Results  Component Value Date   CHOL 77 09/11/2011   CHOL  Value: 92        ATP III CLASSIFICATION:  <200     mg/dL   Desirable  782-956  mg/dL   Borderline High  >=213    mg/dL   High        0/86/5784   CHOL 119 02/02/2009   Lab Results  Component Value Date   HDL 39.50 09/11/2011   HDL 32* 07/05/2009   HDL 64.10 02/02/2009   Lab Results  Component Value Date   LDLCALC 29 09/11/2011   LDLCALC  Value: 47        Total Cholesterol/HDL:CHD Risk Coronary Heart Disease Risk Table                     Men   Women  1/2 Average Risk   3.4   3.3  Average Risk       5.0   4.4  2 X Average Risk   9.6   7.1  3 X Average Risk  23.4   11.0        Use the calculated Patient Ratio above and the CHD Risk Table to determine the patient's CHD Risk.        ATP III CLASSIFICATION (LDL):  <100     mg/dL   Optimal  696-295  mg/dL   Near or Above                    Optimal  130-159  mg/dL   Borderline  284-132  mg/dL   High  >440     mg/dL   Very High 11/15/7251   LDLCALC 45 02/02/2009   Lab Results  Component Value Date   TRIG 41.0 09/11/2011   TRIG 65 07/05/2009   TRIG 51.0 02/02/2009   Lab Results  Component Value Date   CHOLHDL 2 09/11/2011   CHOLHDL 2.9 07/05/2009   CHOLHDL 2 02/02/2009   No results found for this basename: LDLDIRECT     Anemia- chronic dz , has seen heme  Renal insuff- sees renal - told to chew  a tums daily for slt low calcium   Gout -- has been well controlled   Breathing / copd - about the same -continues to wear 02  Had his flu shot today   Patient Active Problem List  Diagnosis  . VITAMIN B12 DEFICIENCY  . PURE HYPERCHOLESTEROLEMIA  . HYPERLIPIDEMIA  . GOUT  . ANEMIA-NOS  . THROMBOCYTOPENIA  . ALCOHOL ABUSE  . TOBACCO ABUSE  . HEARING LOSS  . HYPERTENSION  . MI  . CORONARY ARTERY DISEASE  . COR PULMONALE  . CARDIOMYOPATHY  . COPD  .  DIVERTICULOSIS, COLON  . RENAL INSUFFICIENCY  . OSTEOARTHRITIS  . FATIGUE  . EDEMA  . URGENCY OF URINATION  . PACER/LEAD MECH. COMPLICATION  . Leg skin lesion, right  . Acute bronchitis with bronchospasm   Past Medical History  Diagnosis Date  . Cardiomyopathy   . Hypertension   . Coronary artery disease   . Heart attack   . Hyperlipidemia   . Vitamin B12 deficiency   . Pneumonia   . Thrombocytopenia   . Cor pulmonale   . Hearing loss   . Alcohol abuse, unspecified   . Tobacco use disorder   . Renal insufficiency   . Osteoarthritis   . Gout   . Diverticulosis of colon   . COPD (chronic obstructive pulmonary disease)   . Anemia   . Hypercholesterolemia   . Bronchiectasis     CT Chest 07/07/09 bilateral lower lobe medial bronchiectasis  . CHF (congestive heart failure)    Past Surgical History  Procedure Date  . Cardiac catheterization     (catheterization 2006 demonstrated an   occluded LAD.  There is a proximal stent in the RCA which was   patent.  He had distal 75% and 95% RCA lesions.  Initially it was  thought that he would have PCI but he was managed medically).  . Mrils     disk disease 06/1999  . Esophagogastroduodenoscopy     gastritis 12/1999   History  Substance Use Topics  . Smoking status: Former Smoker -- 1.0 packs/day for 48 years    Quit date: 11/13/1996  . Smokeless tobacco: Not on file  . Alcohol Use: No   Family History  Problem Relation Age of Onset  . Lymphoma Brother    Allergies   Allergen Reactions  . Doxazosin Mesylate     REACTION: unknown reaction  . Penicillins     REACTION: unknown reaction   Current Outpatient Prescriptions on File Prior to Visit  Medication Sig Dispense Refill  . allopurinol (ZYLOPRIM) 300 MG tablet Take 300 mg by mouth daily.        Marland Kitchen aspirin 325 MG tablet Take 325 mg by mouth daily.        . cholecalciferol (VITAMIN D) 1000 UNITS tablet Take 1,000 Units by mouth 2 (two) times daily.      . clopidogrel (PLAVIX) 75 MG tablet Take 1 tablet (75 mg total) by mouth daily.  90 tablet  3  . Cyanocobalamin (VITAMIN B-12 IJ) Inject as directed. monthly       . furosemide (LASIX) 80 MG tablet TAKE 1 TABLET EVERY MORNINGAND TAKE 1/2 TABLET IN THE EVENING ON DAYSMONDAY,    WEDNESDAY, AND FRIDAY  109 tablet  3  . isosorbide mononitrate (IMDUR) 30 MG 24 hr tablet TAKE 2 TABLETS DAILY  180 tablet  3  . losartan (COZAAR) 50 MG tablet TAKE 1 TABLET TWICE A DAY  180 tablet  3  . metoprolol succinate (TOPROL-XL) 100 MG 24 hr tablet Take 1 tablet (100 mg total) by mouth daily. Take with or immediately following a meal.  90 tablet  3  . nitroGLYCERIN (NITROSTAT) 0.4 MG SL tablet Place 1 tablet (0.4 mg total) under the tongue every 5 (five) minutes as needed for chest pain.  25 tablet  4  . NON FORMULARY Oxygen- 2 liters every 24 hours       . simvastatin (ZOCOR) 20 MG tablet TAKE 1 TABLET AT BEDTIME  90 tablet  3  . SPIRIVA HANDIHALER 18 MCG  inhalation capsule INHALE THE CONTENTS OF 1   CAPSULE  DAILY  90 capsule  3       Review of Systems Review of Systems  Constitutional: Negative for fever, appetite change, fatigue and unexpected weight change.  Eyes: Negative for pain and visual disturbance.  ENT pos for hearing loss/ uses hearing aides Respiratory: Negative for cough and pos for baseline sob  Cardiovascular: Negative for cp or palpitations    Gastrointestinal: Negative for nausea, diarrhea and constipation.  Genitourinary: Negative for urgency and  frequency.  Skin: Negative for pallor or rash   Neurological: Negative for weakness, light-headedness, numbness and headaches.  Hematological: Negative for adenopathy. Does not bruise/bleed easily.  Psychiatric/Behavioral: Negative for dysphoric mood. The patient is not nervous/anxious.         Objective:   Physical Exam  Constitutional: He appears well-developed and well-nourished. No distress.       overwt elderly male with 02 by nasal cannula  HENT:  Head: Normocephalic and atraumatic.  Right Ear: External ear normal.  Left Ear: External ear normal.  Nose: Nose normal.  Mouth/Throat: Oropharynx is clear and moist.  Eyes: Conjunctivae normal and EOM are normal. Pupils are equal, round, and reactive to light. No scleral icterus.  Neck: Normal range of motion. Neck supple. No JVD present. Carotid bruit is not present. No thyromegaly present.  Cardiovascular: Normal rate, regular rhythm, normal heart sounds and intact distal pulses.  Exam reveals no gallop.   Pulmonary/Chest: Effort normal and breath sounds normal. No respiratory distress. He has no wheezes.       Diffusely distant bs  No wheeze today  Abdominal: Soft. Bowel sounds are normal. He exhibits no distension, no abdominal bruit and no mass. There is no tenderness.  Musculoskeletal: He exhibits edema. He exhibits no tenderness.       Trace ankle edema baseline  Lymphadenopathy:    He has no cervical adenopathy.  Neurological: He is alert. He has normal reflexes. No cranial nerve deficit. He exhibits normal muscle tone. Coordination normal.  Skin: Skin is warm and dry. No rash noted.  Psychiatric: He has a normal mood and affect.          Assessment & Plan:

## 2012-08-28 NOTE — Assessment & Plan Note (Signed)
Sees Dr Kellie Simmering  Has been stable  Check uric acid level

## 2012-08-28 NOTE — Assessment & Plan Note (Signed)
Followed by renal - overall stable

## 2012-08-28 NOTE — Assessment & Plan Note (Signed)
Lab today crestor and diet  Has gained some wt Disc low sat fat diet

## 2012-08-28 NOTE — Assessment & Plan Note (Signed)
Level today Pt takes shots  Diet is fair

## 2012-09-12 ENCOUNTER — Ambulatory Visit (INDEPENDENT_AMBULATORY_CARE_PROVIDER_SITE_OTHER): Payer: Medicare Other | Admitting: Family Medicine

## 2012-09-12 DIAGNOSIS — E538 Deficiency of other specified B group vitamins: Secondary | ICD-10-CM

## 2012-09-12 MED ORDER — CYANOCOBALAMIN 1000 MCG/ML IJ SOLN
1000.0000 ug | Freq: Once | INTRAMUSCULAR | Status: AC
  Administered 2012-09-12: 1000 ug via INTRAMUSCULAR

## 2012-10-01 ENCOUNTER — Ambulatory Visit (INDEPENDENT_AMBULATORY_CARE_PROVIDER_SITE_OTHER): Payer: Medicare Other | Admitting: Cardiovascular Disease

## 2012-10-01 ENCOUNTER — Encounter: Payer: Self-pay | Admitting: Cardiovascular Disease

## 2012-10-01 VITALS — BP 123/68 | HR 67 | Ht 71.0 in | Wt 245.0 lb

## 2012-10-01 DIAGNOSIS — I10 Essential (primary) hypertension: Secondary | ICD-10-CM

## 2012-10-01 DIAGNOSIS — E7800 Pure hypercholesterolemia, unspecified: Secondary | ICD-10-CM

## 2012-10-01 DIAGNOSIS — J449 Chronic obstructive pulmonary disease, unspecified: Secondary | ICD-10-CM

## 2012-10-01 DIAGNOSIS — I2510 Atherosclerotic heart disease of native coronary artery without angina pectoris: Secondary | ICD-10-CM

## 2012-10-01 NOTE — Patient Instructions (Addendum)
Your physician wants you to follow-up in:  6 MONTHS WITH DR NISHAN  You will receive a reminder letter in the mail two months in advance. If you don't receive a letter, please call our office to schedule the follow-up appointment. Your physician recommends that you continue on your current medications as directed. Please refer to the Current Medication list given to you today. 

## 2012-10-01 NOTE — Assessment & Plan Note (Signed)
Cholesterol is at goal.  Continue current dose of statin and diet Rx.  No myalgias or side effects.  F/U  LFT's in 6 months. Lab Results  Component Value Date   LDLCALC 56 08/28/2012             

## 2012-10-01 NOTE — Assessment & Plan Note (Signed)
Well controlled.  Continue current medications and low sodium Dash type diet.    

## 2012-10-01 NOTE — Assessment & Plan Note (Signed)
Stable with no angina and good activity level.  Continue medical Rx  

## 2012-10-01 NOTE — Assessment & Plan Note (Signed)
Oxygen dependant Has had flu shot Normally sees primary but may benefit from f/u with Dr Sherene Sires

## 2012-10-01 NOTE — Progress Notes (Signed)
Patient ID: Bill Walker, male   DOB: 1928-01-02, 78 y.o.   MRN: [id number] Bill Walker is seen today for F/U of CAD. In hospital 4/12 and 5/12. He was cathed by Dr Riley Kill 5/11 and had 3VD. I reviewed his cath films and agree that his lesions are not amenable to PCI and he is not a CABG candidate due to his age and advanced oxygen dependant COPD. In hospital 4/12 hospital with respitory failure and pneumonia 2/12 Hospice involved with care Since D/C he has not had SSCP His activity is limited by dypnea. The heat in particular bothers him. He has been compliant with his meds and had is sl nitro with him today. He has had some increasing LE edema. His wife has taken the salt shaker away. He has not needed any nitro. Chronic thrombocytopenia sees Heme/Onc and gets B12 shorts for anemia Needs a refill on nitro  . Sees [facility] for COPD. Bruises easily recent URI rx with antibiotics and prednisone with quick resolution  ROS: Denies fever, malais, weight loss, blurry vision, decreased visual acuity, cough, sputum, , hemoptysis, pleuritic pain, palpitaitons, heartburn, abdominal pain, melena, claudication, or rash.  All other systems reviewed and negative  General: Affect appropriate Elderly chronically ill male HEENT: normal Neck supple with no adenopathy JVP normal no bruits no thyromegaly Lungs distant no wheezing and good diaphragmatic motion Heart:  S1/S2 no murmur, no rub, gallop or click PMI normal Abdomen: benighn, BS positve, no tenderness, no AAA no bruit.  No HSM or HJR Distal pulses intact with no bruits Plus one bilateral edema Neuro non-focal Skin warm and dry No muscular weakness   Current Outpatient Prescriptions  Medication Sig Dispense Refill  . allopurinol (ZYLOPRIM) 300 MG tablet Take 300 mg by mouth daily.        Marland Kitchen aspirin 325 MG tablet Take 325 mg by mouth daily.        . calcium carbonate (TUMS - DOSED IN MG ELEMENTAL CALCIUM) 500 MG chewable tablet Chew 1 tablet by mouth daily.       . cholecalciferol (VITAMIN D) 1000 UNITS tablet Take 1,000 Units by mouth 2 (two) times daily.      . clopidogrel (PLAVIX) 75 MG tablet Take 1 tablet (75 mg total) by mouth daily.  90 tablet  3  . Cyanocobalamin (VITAMIN B-12 IJ) Inject as directed. monthly       . furosemide (LASIX) 80 MG tablet TAKE 1 TABLET EVERY MORNINGAND TAKE 1/2 TABLET IN THE EVENING ON DAYSMONDAY,    WEDNESDAY, AND FRIDAY  109 tablet  3  . isosorbide mononitrate (IMDUR) 30 MG 24 hr tablet TAKE 2 TABLETS DAILY  180 tablet  3  . losartan (COZAAR) 50 MG tablet TAKE 1 TABLET TWICE A DAY  180 tablet  3  . metoprolol succinate (TOPROL-XL) 100 MG 24 hr tablet Take 1 tablet (100 mg total) by mouth daily. Take with or immediately following a meal.  90 tablet  3  . nitroGLYCERIN (NITROSTAT) 0.4 MG SL tablet Place 1 tablet (0.4 mg total) under the tongue every 5 (five) minutes as needed for chest pain.  25 tablet  4  . NON FORMULARY Oxygen- 2 liters every 24 hours       . simvastatin (ZOCOR) 20 MG tablet TAKE 1 TABLET AT BEDTIME  90 tablet  3  . SPIRIVA HANDIHALER 18 MCG inhalation capsule INHALE THE CONTENTS OF 1   CAPSULE  DAILY  90 capsule  3    Allergies  Doxazosin  mesylate and Penicillins  Electrocardiogram:  NSR rate 61 old IMI/AMI  Assessment and Plan

## 2012-10-17 ENCOUNTER — Ambulatory Visit (INDEPENDENT_AMBULATORY_CARE_PROVIDER_SITE_OTHER): Payer: Medicare Other | Admitting: Family Medicine

## 2012-10-17 DIAGNOSIS — E538 Deficiency of other specified B group vitamins: Secondary | ICD-10-CM

## 2012-10-17 MED ORDER — CYANOCOBALAMIN 1000 MCG/ML IJ SOLN
1000.0000 ug | Freq: Once | INTRAMUSCULAR | Status: AC
  Administered 2012-10-17: 1000 ug via INTRAMUSCULAR

## 2012-11-12 ENCOUNTER — Other Ambulatory Visit: Payer: Self-pay | Admitting: Family Medicine

## 2012-11-12 MED ORDER — LOSARTAN POTASSIUM 50 MG PO TABS: 50.0000 mg | ORAL_TABLET | Freq: Two times a day (BID) | ORAL | Status: DC

## 2012-11-12 MED ORDER — FUROSEMIDE 80 MG PO TABS: 80.0000 mg | ORAL_TABLET | Freq: Every day | ORAL | Status: DC

## 2012-11-12 MED ORDER — METOPROLOL SUCCINATE ER 100 MG PO TB24: 100.0000 mg | ORAL_TABLET | Freq: Every day | ORAL | Status: DC

## 2012-11-12 MED ORDER — ALLOPURINOL 300 MG PO TABS: 300.0000 mg | ORAL_TABLET | Freq: Every day | ORAL | Status: DC

## 2012-11-12 MED ORDER — ISOSORBIDE MONONITRATE ER 30 MG PO TB24: 60.0000 mg | ORAL_TABLET | Freq: Every day | ORAL | Status: DC

## 2012-11-12 MED ORDER — SIMVASTATIN 20 MG PO TABS: 20.0000 mg | ORAL_TABLET | Freq: Every day | ORAL | Status: DC

## 2012-11-12 MED ORDER — CLOPIDOGREL BISULFATE 75 MG PO TABS: 75.0000 mg | ORAL_TABLET | Freq: Every day | ORAL | Status: DC

## 2012-11-12 MED ORDER — TIOTROPIUM BROMIDE MONOHYDRATE 18 MCG IN CAPS: 18.0000 ug | ORAL_CAPSULE | Freq: Every day | RESPIRATORY_TRACT | Status: DC

## 2012-11-12 NOTE — Telephone Encounter (Signed)
Done and Rx's placed in your inbox

## 2012-11-12 NOTE — Telephone Encounter (Signed)
Please print these and I will sign them, thanks

## 2012-11-12 NOTE — Telephone Encounter (Signed)
[facility] notified Rx's are ready for pick-up

## 2012-11-12 NOTE — Telephone Encounter (Deleted)
Pt's wife called requesting written RX refills for new year as pt has a new RX plan.

## 2012-11-12 NOTE — Telephone Encounter (Signed)
Patient called and requested written scripts for mail order on all meds 90 day supply. Please call when ready for pick up.

## 2012-11-21 ENCOUNTER — Other Ambulatory Visit: Payer: Self-pay

## 2012-11-21 ENCOUNTER — Ambulatory Visit (INDEPENDENT_AMBULATORY_CARE_PROVIDER_SITE_OTHER): Payer: Medicare Other | Admitting: *Deleted

## 2012-11-21 DIAGNOSIS — E538 Deficiency of other specified B group vitamins: Secondary | ICD-10-CM

## 2012-11-21 MED ORDER — CYANOCOBALAMIN 1000 MCG/ML IJ SOLN
1000.0000 ug | Freq: Once | INTRAMUSCULAR | Status: AC
  Administered 2012-11-21: 1000 ug via INTRAMUSCULAR

## 2012-11-21 MED ORDER — ALLOPURINOL 300 MG PO TABS: 300.0000 mg | ORAL_TABLET | Freq: Every day | ORAL | Status: DC

## 2012-11-21 MED ORDER — FUROSEMIDE 80 MG PO TABS: 80.0000 mg | ORAL_TABLET | Freq: Every day | ORAL | Status: DC

## 2012-11-21 NOTE — Telephone Encounter (Signed)
Pt's wife didn't want Rx's called in to pharm. She wants a paper Rx, pt notified that you are not in the office today and that you will sign the Rx tomorrow when you return, Rx's printed and placed in your Inbox

## 2012-11-21 NOTE — Telephone Encounter (Signed)
Please call those in for 30 d supply, thanks

## 2012-11-21 NOTE — Telephone Encounter (Signed)
Pt's wife left note mail order pharmacy has not received mailed rx for allopurinol and furosemide; pt's wife request 30 day written rx for allopurinol and furosemide; pt has 3 days of med available and will wait for call to pick up rx. (does not want med called to local pharmacy).Please advise.

## 2012-11-22 ENCOUNTER — Other Ambulatory Visit: Payer: Self-pay | Admitting: *Deleted

## 2012-11-22 MED ORDER — ALLOPURINOL 300 MG PO TABS: 300.0000 mg | ORAL_TABLET | Freq: Every day | ORAL | Status: DC

## 2012-11-22 MED ORDER — FUROSEMIDE 80 MG PO TABS: 80.0000 mg | ORAL_TABLET | Freq: Every day | ORAL | Status: DC

## 2012-11-22 NOTE — Telephone Encounter (Signed)
Pt's wife notified Rx ready for pick-up 

## 2012-11-22 NOTE — Telephone Encounter (Signed)
Called home # twice and still busy, called cell # on file and no answer, will try to call back later

## 2012-11-22 NOTE — Telephone Encounter (Signed)
Dr. Milinda Antis signed Rx's, called pt's home # 3 times but phone busy, called cell # on file and no answer, will try to call back later

## 2012-11-26 ENCOUNTER — Telehealth: Payer: Self-pay

## 2012-11-26 MED ORDER — FUROSEMIDE 80 MG PO TABS: 80.0000 mg | ORAL_TABLET | Freq: Every day | ORAL | Status: DC

## 2012-11-26 NOTE — Telephone Encounter (Signed)
He does take it 1 tab in am daily and then a tab in pm on mon/ wed and fri  So a 90 day supply would equal 144? I think- if that is not correct let me know  Will print out for [facility] rx

## 2012-11-26 NOTE — Telephone Encounter (Signed)
New Rx faxed to pharm. with corrected instructions

## 2012-11-26 NOTE — Telephone Encounter (Signed)
[place] Sink with [facility] left v/m requesting clarification of furosemide 80 mg instructions and quantity on refill  done 11/12/12. Instructions said take one every day; take one tablet every morning and take 1/2 in evening on Mon,Wed, and Fri. Quantity is 109 and that does not match directions. Call [phone] with ref # [id number].Please advise.

## 2012-11-29 ENCOUNTER — Encounter: Payer: Self-pay | Admitting: Family Medicine

## 2012-11-29 ENCOUNTER — Ambulatory Visit (INDEPENDENT_AMBULATORY_CARE_PROVIDER_SITE_OTHER): Payer: Medicare Other | Admitting: Family Medicine

## 2012-11-29 VITALS — BP 110/56 | HR 100 | Temp 98.3°F | Ht 71.0 in | Wt 248.0 lb

## 2012-11-29 DIAGNOSIS — N390 Urinary tract infection, site not specified: Secondary | ICD-10-CM | POA: Insufficient documentation

## 2012-11-29 DIAGNOSIS — R6883 Chills (without fever): Secondary | ICD-10-CM

## 2012-11-29 LAB — POCT UA - MICROSCOPIC ONLY
Casts, Ur, LPF, POC: 0
Crystals, Ur, HPF, POC: 0
Yeast, UA: 0

## 2012-11-29 LAB — POCT URINALYSIS DIPSTICK
Ketones, UA: NEGATIVE
Spec Grav, UA: 1.025
pH, UA: 5

## 2012-11-29 MED ORDER — CIPROFLOXACIN HCL 250 MG PO TABS: 250.0000 mg | ORAL_TABLET | Freq: Two times a day (BID) | ORAL | Status: DC

## 2012-11-29 NOTE — Progress Notes (Signed)
Subjective:    Patient ID: Bill Walker, male    DOB: Jun 03, 1928, 79 y.o.   MRN: [id number]  HPI Here for decrease in appetite and also chills  Started wed   Feels a little better today  Nose is runny- per his wife this is normal for him  Bad day yesterday with chills (did not take temp)  No aches  No appetite at all  Would only drink ensure   No cough more than usual  No headache  Pt denies urinary symptoms  Per wife, however he had inc frequency to urinate and more incontinence   Patient Active Problem List  Diagnosis  . VITAMIN B12 DEFICIENCY  . PURE HYPERCHOLESTEROLEMIA  . HYPERLIPIDEMIA  . GOUT  . ANEMIA-NOS  . THROMBOCYTOPENIA  . ALCOHOL ABUSE  . TOBACCO ABUSE  . HEARING LOSS  . HYPERTENSION  . MI  . CORONARY ARTERY DISEASE  . COR PULMONALE  . CARDIOMYOPATHY  . COPD  . DIVERTICULOSIS, COLON  . RENAL INSUFFICIENCY  . OSTEOARTHRITIS  . FATIGUE  . EDEMA  . URGENCY OF URINATION  . PACER/LEAD MECH. COMPLICATION  . Leg skin lesion, right  . Acute bronchitis with bronchospasm   Past Medical History  Diagnosis Date  . Cardiomyopathy   . Hypertension   . Coronary artery disease   . Heart attack   . Hyperlipidemia   . Vitamin B12 deficiency   . Pneumonia   . Thrombocytopenia   . Cor pulmonale   . Hearing loss   . Alcohol abuse, unspecified   . Tobacco use disorder   . Renal insufficiency   . Osteoarthritis   . Gout   . Diverticulosis of colon   . COPD (chronic obstructive pulmonary disease)   . Anemia   . Hypercholesterolemia   . Bronchiectasis     CT Chest 07/07/09 bilateral lower lobe medial bronchiectasis  . CHF (congestive heart failure)    Past Surgical History  Procedure Date  . Cardiac catheterization     (catheterization 2006 demonstrated an   occluded LAD.  There is a proximal stent in the RCA which was   patent.  He had distal 75% and 95% RCA lesions.  Initially it was  thought that he would have PCI but he was managed medically).  .  Mrils     disk disease 06/1999  . Esophagogastroduodenoscopy     gastritis 12/1999   History  Substance Use Topics  . Smoking status: Former Smoker -- 1.0 packs/day for 48 years    Quit date: 11/13/1996  . Smokeless tobacco: Not on file  . Alcohol Use: No   Family History  Problem Relation Age of Onset  . Lymphoma Brother    Allergies  Allergen Reactions  . Doxazosin Mesylate     REACTION: unknown reaction  . Penicillins     REACTION: unknown reaction   Current Outpatient Prescriptions on File Prior to Visit  Medication Sig Dispense Refill  . allopurinol (ZYLOPRIM) 300 MG tablet Take 1 tablet (300 mg total) by mouth daily.  30 tablet  0  . aspirin 325 MG tablet Take 325 mg by mouth daily.        . calcium carbonate (TUMS - DOSED IN MG ELEMENTAL CALCIUM) 500 MG chewable tablet Chew 1 tablet by mouth daily.      . cholecalciferol (VITAMIN D) 1000 UNITS tablet Take 1,000 Units by mouth 2 (two) times daily.      . clopidogrel (PLAVIX) 75 MG tablet Take 1 tablet (  75 mg total) by mouth daily.  90 tablet  3  . Cyanocobalamin (VITAMIN B-12 IJ) Inject as directed. monthly       . furosemide (LASIX) 80 MG tablet Take 1 tablet (80 mg total) by mouth daily. TAKE 1 TABLET EVERY MORNING AND TAKE 1/2 TABLET IN THE EVENING ON DAYS MONDAY,    WEDNESDAY, AND FRIDAY  144 tablet  3  . isosorbide mononitrate (IMDUR) 30 MG 24 hr tablet Take 2 tablets (60 mg total) by mouth daily.  180 tablet  3  . losartan (COZAAR) 50 MG tablet Take 1 tablet (50 mg total) by mouth 2 (two) times daily.  180 tablet  3  . metoprolol succinate (TOPROL-XL) 100 MG 24 hr tablet Take 1 tablet (100 mg total) by mouth daily. Take with or immediately following a meal.  90 tablet  3  . nitroGLYCERIN (NITROSTAT) 0.4 MG SL tablet Place 1 tablet (0.4 mg total) under the tongue every 5 (five) minutes as needed for chest pain.  25 tablet  4  . NON FORMULARY Oxygen- 2 liters every 24 hours       . simvastatin (ZOCOR) 20 MG tablet Take 1  tablet (20 mg total) by mouth at bedtime.  90 tablet  3  . tiotropium (SPIRIVA HANDIHALER) 18 MCG inhalation capsule Place 1 capsule (18 mcg total) into inhaler and inhale daily.  90 capsule  3      Review of Systems Review of Systems  Constitutional: Negative for fever, appetite change,  and unexpected weight change. pos for fatigue and sleeping more ENT pos for clear rhinorrhea/neg for sinus pain or st Eyes: Negative for pain and visual disturbance.  Respiratory: Negative for cough and shortness of breath.  wheezing is not a problem Cardiovascular: Negative for cp or palpitations    Gastrointestinal: Negative for nausea, diarrhea and constipation.  Genitourinary: Negative for urgency and frequency. neg for blood in urine or flank pain  Skin: Negative for pallor or rash   Neurological: Negative for weakness, light-headedness, numbness and headaches.  Hematological: Negative for adenopathy. Does not bruise/bleed easily.  Psychiatric/Behavioral: Negative for dysphoric mood. The patient is not nervous/anxious.         Objective:   Physical Exam  Constitutional: He appears well-developed and well-nourished. No distress.       Elderly frail appearing male on 57  HENT:  Head: Normocephalic and atraumatic.       Very hard of hearing   Eyes: Conjunctivae normal and EOM are normal. Pupils are equal, round, and reactive to light.  Neck: Normal range of motion. Neck supple.  Cardiovascular: Regular rhythm and normal heart sounds.  Exam reveals no gallop.   Pulmonary/Chest: Effort normal and breath sounds normal. No respiratory distress. He has no wheezes. He has no rales.       Diffusely distant bs - this is baseline  Abdominal: Soft. Bowel sounds are normal. He exhibits no distension and no mass. There is no tenderness.       No suprapubic tenderness or fullness    Musculoskeletal:       No cva tenderness   Lymphadenopathy:    He has no cervical adenopathy.  Neurological: He is alert.    Skin: Skin is warm and dry. No rash noted. No erythema. No pallor.  Psychiatric: He has a normal mood and affect.          Assessment & Plan:

## 2012-11-29 NOTE — Patient Instructions (Addendum)
I think you have a urinary tract infection Drink lots of water Take the cipro as directed  We will update you when culture results return Update if not starting to improve over the weekend or if worsening

## 2012-11-29 NOTE — Assessment & Plan Note (Signed)
With frequent urination/ fatigue/ chills and dec appetite  Sent for cx tx with low dose cipro (renal insuff) - for 5 d  Update if not starting to improve in several days or if worsening

## 2012-12-05 ENCOUNTER — Encounter: Payer: Self-pay | Admitting: Oncology

## 2012-12-05 ENCOUNTER — Other Ambulatory Visit (HOSPITAL_BASED_OUTPATIENT_CLINIC_OR_DEPARTMENT_OTHER): Payer: Medicare Other | Admitting: Lab

## 2012-12-05 ENCOUNTER — Ambulatory Visit (HOSPITAL_BASED_OUTPATIENT_CLINIC_OR_DEPARTMENT_OTHER): Payer: Medicare Other | Admitting: Oncology

## 2012-12-05 VITALS — BP 148/72 | HR 86 | Temp 97.4°F | Resp 20 | Ht 71.0 in | Wt 253.4 lb

## 2012-12-05 DIAGNOSIS — E538 Deficiency of other specified B group vitamins: Secondary | ICD-10-CM

## 2012-12-05 DIAGNOSIS — D649 Anemia, unspecified: Secondary | ICD-10-CM

## 2012-12-05 LAB — CBC WITH DIFFERENTIAL/PLATELET
BASO%: 0.4 % (ref 0.0–2.0)
HCT: 31.7 % — ABNORMAL LOW (ref 38.4–49.9)
LYMPH%: 13.4 % — ABNORMAL LOW (ref 14.0–49.0)
MCHC: 33.1 g/dL (ref 32.0–36.0)
MCV: 97.9 fL (ref 79.3–98.0)
MONO%: 7.5 % (ref 0.0–14.0)
NEUT%: 76.2 % — ABNORMAL HIGH (ref 39.0–75.0)
Platelets: 163 10*3/uL (ref 140–400)
RBC: 3.23 10*6/uL — ABNORMAL LOW (ref 4.20–5.82)

## 2012-12-05 NOTE — Progress Notes (Signed)
OFFICE PROGRESS NOTE  CC  Roxy Manns, MD [address] [place] [address], [place] [address]  DIAGNOSIS: 79 year old gentleman with thrombocytopenia anemia and with B12 deficiency.  PRIOR THERAPY:  #1 patient has been on observation for the leukopenia and from cytopenia.  #24 anemia patient has been on B12 injections and iron as needed.  CURRENT THERAPY:Patient is getting B12 injections on a monthly basis by Dr. Vinnie Langton  INTERVAL HISTORY: Bill Walker 79 y.o. male returns for Followup visit at one year. Overall he is doing well. .Today he denies any fevers chills night sweats headaches shortness of breath chest pains palpitations he has no myalgias or arthralgias. He is on oxygen his levels remain pretty stable. Remainder of the 10 point review of systems is negative.  MEDICAL HISTORY: Past Medical History  Diagnosis Date  . Cardiomyopathy   . Hypertension   . Coronary artery disease   . Heart attack   . Hyperlipidemia   . Vitamin B12 deficiency   . Pneumonia   . Thrombocytopenia   . Cor pulmonale   . Hearing loss   . Alcohol abuse, unspecified   . Tobacco use disorder   . Renal insufficiency   . Osteoarthritis   . Gout   . Diverticulosis of colon   . COPD (chronic obstructive pulmonary disease)   . Anemia   . Hypercholesterolemia   . Bronchiectasis     CT Chest 07/07/09 bilateral lower lobe medial bronchiectasis  . CHF (congestive heart failure)     ALLERGIES:  is allergic to doxazosin mesylate and penicillins.  MEDICATIONS:  Current Outpatient Prescriptions  Medication Sig Dispense Refill  . allopurinol (ZYLOPRIM) 300 MG tablet Take 1 tablet (300 mg total) by mouth daily.  30 tablet  0  . aspirin 325 MG tablet Take 325 mg by mouth daily.        . calcium carbonate (TUMS - DOSED IN MG ELEMENTAL CALCIUM) 500 MG chewable tablet Chew 1 tablet by mouth daily.      . cholecalciferol (VITAMIN D) 1000 UNITS tablet Take 1,000 Units by mouth 2  (two) times daily.      . clopidogrel (PLAVIX) 75 MG tablet Take 1 tablet (75 mg total) by mouth daily.  90 tablet  3  . Cyanocobalamin (VITAMIN B-12 IJ) Inject as directed. monthly       . furosemide (LASIX) 80 MG tablet Take 1 tablet (80 mg total) by mouth daily. TAKE 1 TABLET EVERY MORNING AND TAKE 1/2 TABLET IN THE EVENING ON DAYS MONDAY,    WEDNESDAY, AND FRIDAY  144 tablet  3  . isosorbide mononitrate (IMDUR) 30 MG 24 hr tablet Take 2 tablets (60 mg total) by mouth daily.  180 tablet  3  . losartan (COZAAR) 50 MG tablet Take 1 tablet (50 mg total) by mouth 2 (two) times daily.  180 tablet  3  . metoprolol succinate (TOPROL-XL) 100 MG 24 hr tablet Take 1 tablet (100 mg total) by mouth daily. Take with or immediately following a meal.  90 tablet  3  . nitroGLYCERIN (NITROSTAT) 0.4 MG SL tablet Place 1 tablet (0.4 mg total) under the tongue every 5 (five) minutes as needed for chest pain.  25 tablet  4  . NON FORMULARY Oxygen- 2 liters every 24 hours       . simvastatin (ZOCOR) 20 MG tablet Take 1 tablet (20 mg total) by mouth at bedtime.  90 tablet  3  . tiotropium (SPIRIVA HANDIHALER)  18 MCG inhalation capsule Place 1 capsule (18 mcg total) into inhaler and inhale daily.  90 capsule  3    SURGICAL HISTORY:  Past Surgical History  Procedure Date  . Cardiac catheterization     (catheterization 2006 demonstrated an   occluded LAD.  There is a proximal stent in the RCA which was   patent.  He had distal 75% and 95% RCA lesions.  Initially it was  thought that he would have PCI but he was managed medically).  . Mrils     disk disease 06/1999  . Esophagogastroduodenoscopy     gastritis 12/1999    REVIEW OF SYSTEMS:  Pertinent items are noted in HPI.   PHYSICAL EXAMINATION: General appearance: alert, cooperative and appears stated age Resp: clear to auscultation bilaterally Cardio: regular rate and rhythm, S1, S2 normal, no murmur, click, rub or gallop GI: soft, non-tender; bowel sounds  normal; no masses,  no organomegaly Extremities: no edema, redness or tenderness in the calves or thighs and Bilateral lower extremity edema +2 Neurologic: Alert and oriented X 3, normal strength and tone. Normal symmetric reflexes. Normal coordination and gait  ECOG PERFORMANCE STATUS: 1 - Symptomatic but completely ambulatory  Blood pressure 148/72, pulse 86, temperature 97.4 F (36.3 C), temperature source Oral, resp. rate 20, height 5\' 11"  (1.803 m), weight 253 lb 6.4 oz (114.941 kg).  LABORATORY DATA: Lab Results  Component Value Date   WBC 7.6 12/05/2012   HGB 10.5* 12/05/2012   HCT 31.7* 12/05/2012   MCV 97.9 12/05/2012   PLT 163 12/05/2012      Chemistry      Component Value Date/Time   NA 142 08/28/2012 0925   NA 145 12/16/2009 1105   K 5.1 08/28/2012 0925   K 4.2 12/16/2009 1105   CL 99 08/28/2012 0925   CL 102 12/16/2009 1105   CO2 37* 08/28/2012 0925   CO2 35* 12/16/2009 1105   BUN 40* 08/28/2012 0925   BUN 27* 12/16/2009 1105   CREATININE 1.7* 08/28/2012 0925   CREATININE 1.7* 12/16/2009 1105      Component Value Date/Time   CALCIUM 9.1 08/28/2012 0925   CALCIUM 8.5 12/16/2009 1105   ALKPHOS 50 08/28/2012 0925   ALKPHOS 61 03/17/2009 1301   AST 18 08/28/2012 0925   AST 21 03/17/2009 1301   ALT 9 08/28/2012 0925   BILITOT 0.6 08/28/2012 0925   BILITOT 0.80 03/17/2009 1301       RADIOGRAPHIC STUDIES:  No results found.  ASSESSMENT: 79 year old gentleman with mild pancytopenia. Patient is anemic with B12 deficiency. He has been on monthly B12 injections at Dr. Lucretia Roers office. He is tolerating it well. His hemoglobin today is 11.1 very much acceptable he remains asymptomatic and we will continue to follow him.   PLAN: Patient will be seen back in one years time for followup   All questions were answered. The patient knows to call the clinic with any problems, questions or concerns. We can certainly see the patient much sooner if necessary.  I spent 15 minutes counseling  the patient face to face. The total time spent in the appointment was 30 minutes.    Drue Second, MD Medical/Oncology [facility] [phone] (beeper) [phone] (Office)  12/05/2012, 12:20 PM

## 2012-12-26 ENCOUNTER — Ambulatory Visit: Payer: PRIVATE HEALTH INSURANCE

## 2013-01-02 ENCOUNTER — Ambulatory Visit (INDEPENDENT_AMBULATORY_CARE_PROVIDER_SITE_OTHER): Payer: Medicare Other | Admitting: *Deleted

## 2013-01-02 DIAGNOSIS — E538 Deficiency of other specified B group vitamins: Secondary | ICD-10-CM

## 2013-01-02 MED ORDER — CYANOCOBALAMIN 1000 MCG/ML IJ SOLN
1000.0000 ug | Freq: Once | INTRAMUSCULAR | Status: AC
  Administered 2013-01-02: 1000 ug via INTRAMUSCULAR

## 2013-01-30 ENCOUNTER — Ambulatory Visit (INDEPENDENT_AMBULATORY_CARE_PROVIDER_SITE_OTHER): Payer: Medicare Other | Admitting: *Deleted

## 2013-01-30 MED ORDER — CYANOCOBALAMIN 1000 MCG/ML IJ SOLN
1000.0000 ug | Freq: Once | INTRAMUSCULAR | Status: AC
  Administered 2013-01-30: 1000 ug via INTRAMUSCULAR

## 2013-01-30 NOTE — Addendum Note (Signed)
Addended by: Shon Millet on: 01/30/2013 09:30 AM   Modules accepted: Level of Service

## 2013-03-06 ENCOUNTER — Other Ambulatory Visit: Payer: Self-pay | Admitting: Medical Oncology

## 2013-03-06 ENCOUNTER — Ambulatory Visit (INDEPENDENT_AMBULATORY_CARE_PROVIDER_SITE_OTHER): Payer: Medicare Other | Admitting: *Deleted

## 2013-03-06 DIAGNOSIS — E538 Deficiency of other specified B group vitamins: Secondary | ICD-10-CM

## 2013-03-06 MED ORDER — CYANOCOBALAMIN 1000 MCG/ML IJ SOLN
1000.0000 ug | Freq: Once | INTRAMUSCULAR | Status: AC
  Administered 2013-03-06: 1000 ug via INTRAMUSCULAR

## 2013-03-06 NOTE — Telephone Encounter (Signed)
Patient's spouse called to inform Dr Welton Flakes that [facility] informed patient that their office will no longer be giving B-12 injections after their visit with them today. Spouse asking if ok for pt to receive a pill instead of the injection. Reviewed with MD and informed spouse that per MD, patient to take vitamin B12 1000 mcg daily. Spouse verbalized understanding and stated she would prefer to purchase these over the counter. Expressed thanks, no further questions at this time. Knows to call with any questions or concerns.

## 2013-04-01 ENCOUNTER — Ambulatory Visit (INDEPENDENT_AMBULATORY_CARE_PROVIDER_SITE_OTHER): Payer: Medicare Other | Admitting: Cardiovascular Disease

## 2013-04-01 ENCOUNTER — Encounter: Payer: Self-pay | Admitting: Cardiovascular Disease

## 2013-04-01 VITALS — BP 127/65 | HR 71 | Ht 71.0 in | Wt 234.8 lb

## 2013-04-01 DIAGNOSIS — I10 Essential (primary) hypertension: Secondary | ICD-10-CM

## 2013-04-01 DIAGNOSIS — I279 Pulmonary heart disease, unspecified: Secondary | ICD-10-CM

## 2013-04-01 DIAGNOSIS — E7800 Pure hypercholesterolemia, unspecified: Secondary | ICD-10-CM

## 2013-04-01 DIAGNOSIS — I2510 Atherosclerotic heart disease of native coronary artery without angina pectoris: Secondary | ICD-10-CM

## 2013-04-01 MED ORDER — NITROGLYCERIN 0.4 MG SL SUBL: 0.4000 mg | SUBLINGUAL_TABLET | SUBLINGUAL | Status: DC | PRN

## 2013-04-01 MED ORDER — VITAMIN D 1000 UNITS PO TABS: 1000.0000 [IU] | ORAL_TABLET | Freq: Every day | ORAL | Status: DC

## 2013-04-01 NOTE — Progress Notes (Signed)
Patient ID: Bill Walker, male   DOB: 02-Oct-1928, 79 y.o.   MRN: [id number] Bill Walker is seen today for F/U of CAD. In hospital 4/12 and 5/12. He was cathed by Dr Riley Kill 5/11 and had 3VD. I reviewed his cath films and agree that his lesions are not amenable to PCI and he is not a CABG candidate due to his age and advanced oxygen dependant COPD. In hospital 4/12 hospital with respitory failure and pneumonia 2/12 Hospice involved with care Since D/C he has not had SSCP His activity is limited by dypnea. The heat in particular bothers him. He has been compliant with his meds and had is sl nitro with him today. He has had some increasing LE edema. His wife has taken the salt shaker away. He has not needed any nitro. Chronic thrombocytopenia sees Heme/Onc and gets B12 shorts for anemia Needs a refill on nitro   ROS: Denies fever, malais, weight loss, blurry vision, decreased visual acuity, cough, sputum, , hemoptysis, pleuritic pain, palpitaitons, heartburn, abdominal pain, melena, lower extremity edema, claudication, or rash.  All other systems reviewed and negative  General: Affect appropriate Elderly COPDer with oxygen on  HEENT: normal Neck supple with no adenopathy JVP normal no bruits no thyromegaly Lungs distant no wheezing and good diaphragmatic motion Heart:  S1/S2 no murmur, no rub, gallop or click PMI normal Abdomen: benighn, BS positve, no tenderness, no AAA no bruit.  No HSM or HJR Distal pulses intact with no bruits No edema Neuro non-focal Skin warm and dry No muscular weakness   Current Outpatient Prescriptions  Medication Sig Dispense Refill  . allopurinol (ZYLOPRIM) 300 MG tablet Take 1 tablet (300 mg total) by mouth daily.  30 tablet  0  . aspirin 325 MG tablet Take 325 mg by mouth daily.        . calcium carbonate (TUMS - DOSED IN MG ELEMENTAL CALCIUM) 500 MG chewable tablet Chew 1 tablet by mouth daily.      . cholecalciferol (VITAMIN D) 1000 UNITS tablet Take 1 tablet  (1,000 Units total) by mouth daily.      . clopidogrel (PLAVIX) 75 MG tablet Take 1 tablet (75 mg total) by mouth daily.  90 tablet  3  . furosemide (LASIX) 80 MG tablet Take 1 tablet (80 mg total) by mouth daily. TAKE 1 TABLET EVERY MORNING AND TAKE 1/2 TABLET IN THE EVENING ON DAYS MONDAY,    WEDNESDAY, AND FRIDAY  144 tablet  3  . isosorbide mononitrate (IMDUR) 30 MG 24 hr tablet Take 2 tablets (60 mg total) by mouth daily.  180 tablet  3  . losartan (COZAAR) 50 MG tablet Take 1 tablet (50 mg total) by mouth 2 (two) times daily.  180 tablet  3  . metoprolol succinate (TOPROL-XL) 100 MG 24 hr tablet Take 1 tablet (100 mg total) by mouth daily. Take with or immediately following a meal.  90 tablet  3  . nitroGLYCERIN (NITROSTAT) 0.4 MG SL tablet Place 1 tablet (0.4 mg total) under the tongue every 5 (five) minutes as needed for chest pain.  25 tablet  4  . NON FORMULARY Oxygen- 2 liters every 24 hours       . simvastatin (ZOCOR) 20 MG tablet Take 1 tablet (20 mg total) by mouth at bedtime.  90 tablet  3  . tiotropium (SPIRIVA HANDIHALER) 18 MCG inhalation capsule Place 1 capsule (18 mcg total) into inhaler and inhale daily.  90 capsule  3  . vitamin B-12 (  CYANOCOBALAMIN) 1000 MCG tablet Take 1,000 mcg by mouth daily.       No current facility-administered medications for this visit.    Allergies  Doxazosin mesylate and Penicillins  Electrocardiogram: SR rate 23 old IMI poor R wave progression  10/01/12  Assessment and Plan

## 2013-04-01 NOTE — Assessment & Plan Note (Signed)
F/U Dr Sherene Sires  Currently stable Continue 24/7 oxygen

## 2013-04-01 NOTE — Assessment & Plan Note (Signed)
Cholesterol is at goal.  Continue current dose of statin and diet Rx.  No myalgias or side effects.  F/U  LFT's in 6 months. Lab Results  Component Value Date   LDLCALC 56 08/28/2012

## 2013-04-01 NOTE — Assessment & Plan Note (Signed)
Well controlled.  Continue current medications and low sodium Dash type diet.    

## 2013-04-01 NOTE — Assessment & Plan Note (Signed)
Stable with no angina and good activity level.  Continue medical Rx 3VD not amenable to PCI.  Not a CABG candidate due to advance COPD and age

## 2013-04-01 NOTE — Patient Instructions (Signed)
Your physician wants you to follow-up in:  6 MONTHS WITH DR NISHAN  You will receive a reminder letter in the mail two months in advance. If you don't receive a letter, please call our office to schedule the follow-up appointment. Your physician recommends that you continue on your current medications as directed. Please refer to the Current Medication list given to you today. 

## 2013-05-14 ENCOUNTER — Ambulatory Visit (INDEPENDENT_AMBULATORY_CARE_PROVIDER_SITE_OTHER): Payer: Medicare Other | Admitting: Internal Medicine

## 2013-05-14 ENCOUNTER — Encounter: Payer: Self-pay | Admitting: Internal Medicine

## 2013-05-14 VITALS — BP 122/68 | HR 85 | Temp 97.5°F | Ht 71.0 in | Wt 233.0 lb

## 2013-05-14 DIAGNOSIS — J9612 Chronic respiratory failure with hypercapnia: Secondary | ICD-10-CM | POA: Insufficient documentation

## 2013-05-14 DIAGNOSIS — J9610 Chronic respiratory failure, unspecified whether with hypoxia or hypercapnia: Secondary | ICD-10-CM

## 2013-05-14 DIAGNOSIS — J449 Chronic obstructive pulmonary disease, unspecified: Secondary | ICD-10-CM

## 2013-05-14 NOTE — Progress Notes (Signed)
Subjective:     Patient ID: Bill Walker, male   DOB: Jun 01, 1928, 79 y.o.   MRN: [id number]  HPI    05/14/2013 "new pt" ov/Trev Boley re COPD/ chronic resp failure last seen in 2010 02 dep/ spiriva only Chief Complaint  Patient presents with  . Follow-up    Pt here to re cert for o2. Breathing has been stable and he denies any new co's today.    walks to mailbox and back then back up to 5 steps and then needs to sit down and recover on 02 2lpm pattern no change x years ? 1998, better on spiriva  No obvious daytime variabilty or assoc chronic cough or cp or chest tightness, subjective wheeze overt sinus or hb symptoms. No unusual exp hx or h/o childhood pna/ asthma or knowledge of premature birth.   Sleeping ok without nocturnal  or early am exacerbation  of respiratory  c/o's or need for noct saba. Also denies any obvious fluctuation of symptoms with weather or environmental changes or other aggravating or alleviating factors except as outlined above   Current Medications, Allergies, Past Medical History, Past Surgical History, Family History, and Social History were reviewed in [employer] record.  ROS  The following are not active complaints unless bolded sore throat, dysphagia, dental problems, itching, sneezing,  nasal congestion or excess/ purulent secretions, ear ache,   fever, chills, sweats, unintended wt loss, pleuritic or exertional cp, hemoptysis,  orthopnea pnd or leg swelling, presyncope, palpitations, heartburn, abdominal pain, anorexia, nausea, vomiting, diarrhea  or change in bowel or urinary habits, change in stools or urine, dysuria,hematuria,  rash, arthralgias, visual complaints, headache, numbness weakness or ataxia or problems with walking or coordination,  change in mood/affect or memory.  Past Medical History:  CARDIOMYOPATHY (ICD-425.4)  HYPERTENSION (ICD-401.9)  CORONARY ARTERY DISEASE (ICD-414.00)  MI (ICD-410.90)  HYPERLIPIDEMIA (ICD-272.4)   VITAMIN B12 DEFICIENCY (ICD-266.2)  PNEUMONIA, LEFT (ICD-486)  PNEUMONIA ORGANISM NOS (ICD-486)  THROMBOCYTOPENIA (ICD-287.5)  COR PULMONALE (ICD-416.9)  HEARING LOSS (ICD-389.9)  Hx of ALCOHOL ABUSE (ICD-305.00)  Hx of TOBACCO ABUSE (ICD-305.1)  RENAL INSUFFICIENCY (ICD-588.9)  OSTEOARTHRITIS (ICD-715.90)  GOUT (ICD-274.9)  DIVERTICULOSIS, COLON (ICD-562.10)  COPD (ICD-496)  ANEMIA-NOS (ICD-285.9)(follwed by heme)  PURE HYPERCHOLESTEROLEMIA (ICD-272.0)  BRONCHIECTASIS  - CT Chest 07/07/09 mild bilateral lower lobe medial bronchiectasis  Coronary artery disease  Diverticulosis, colon          Review of Systems     Objective:   Physical Exam Wt Readings from Last 3 Encounters:  05/14/13 233 lb (105.688 kg)  04/01/13 234 lb 12.8 oz (106.505 kg)  12/05/12 253 lb 6.4 oz (114.941 kg)   amb elderly wm extremely hard of hearing  HEENT mild turbinate edema.  Oropharynx no thrush or excess pnd or cobblestoning.  No JVD or cervical adenopathy. Mild accessory muscle hypertrophy. Trachea midline, nl thryroid. Chest was hyperinflated by percussion with diminished breath sounds and moderate increased exp time without wheeze. Hoover sign positive at mid inspiration. Regular rate and rhythm without murmur gallop or rub or increase P2 or edema.  Abd: no hsm, nl excursion. Ext warm without cyanosis or clubbing.      no recent cxr on file  Assessment:

## 2013-05-14 NOTE — Patient Instructions (Addendum)
No change 02 recommendations to use 2lpm 24 hours per day.   If you are satisfied with your treatment plan let your doctor know and he/she can either refill your medications or you can return here when your prescription runs out.     If in any way you are not 100% satisfied,  please tell us.  If 100% better, tell your friends!

## 2013-05-17 NOTE — Assessment & Plan Note (Signed)
Though somewhat paradoxic, when the lung fails to clear C02 properly and pC02 rises the lung then becomes a more efficient scavenger of C02 allowing lower work of breathing and  better C02 clearance albeit at a higher serum pC02 level - this is why pts can look a lot better than their ABG's would suggest and why it's so difficult to prognosticate endstage dz.  It's also why I strongly rec DNI status (ventilating pts down to a nl pC02 adversely affects this compensatory mechanism)   His last hco3 level is 37 but he is relatively well compensated on 2lpm  And spiriva so no change in rx indicated.    Each maintenance medication was reviewed in detail including most importantly the difference between maintenance and as needed and under what circumstances the prns are to be used.  Please see instructions for details which were reviewed in writing and the patient given a copy.

## 2013-05-17 NOTE — Assessment & Plan Note (Signed)
-   02 2lpm 24/7 since 1998 - HC03 37 10/2013so hypercarbic also - 05/14/2013   Walked RA x one lap @ 185 stopped due to sob/ desat to 88% > corrected on 2lpm and able to walk a lap s difficulty.    Adequate control on present rx, reviewed rx  See instructions for specific recommendations which were reviewed directly with the patient who was given a copy with highlighter outlining the key components.

## 2013-07-22 ENCOUNTER — Telehealth: Payer: Self-pay | Admitting: Internal Medicine

## 2013-07-22 NOTE — Telephone Encounter (Signed)
Fax received and date corrected and placed in MW look-at.Carron Curie, CMA

## 2013-08-26 ENCOUNTER — Ambulatory Visit (INDEPENDENT_AMBULATORY_CARE_PROVIDER_SITE_OTHER): Payer: Medicare Other

## 2013-08-26 DIAGNOSIS — Z23 Encounter for immunization: Secondary | ICD-10-CM

## 2013-08-27 ENCOUNTER — Other Ambulatory Visit: Payer: Self-pay

## 2013-08-27 DIAGNOSIS — I2510 Atherosclerotic heart disease of native coronary artery without angina pectoris: Secondary | ICD-10-CM

## 2013-08-27 MED ORDER — NITROGLYCERIN 0.4 MG SL SUBL: 0.4000 mg | SUBLINGUAL_TABLET | SUBLINGUAL | Status: DC | PRN

## 2013-09-17 ENCOUNTER — Ambulatory Visit (INDEPENDENT_AMBULATORY_CARE_PROVIDER_SITE_OTHER): Payer: Medicare Other | Admitting: Cardiovascular Disease

## 2013-09-17 ENCOUNTER — Encounter: Payer: Self-pay | Admitting: Cardiovascular Disease

## 2013-09-17 VITALS — BP 120/64 | HR 80 | Wt 229.0 lb

## 2013-09-17 DIAGNOSIS — E7800 Pure hypercholesterolemia, unspecified: Secondary | ICD-10-CM

## 2013-09-17 DIAGNOSIS — I2510 Atherosclerotic heart disease of native coronary artery without angina pectoris: Secondary | ICD-10-CM

## 2013-09-17 DIAGNOSIS — I279 Pulmonary heart disease, unspecified: Secondary | ICD-10-CM

## 2013-09-17 DIAGNOSIS — I10 Essential (primary) hypertension: Secondary | ICD-10-CM

## 2013-09-17 NOTE — Progress Notes (Signed)
Patient ID: Bill Walker, male   DOB: 1928/08/09, 79 y.o.   MRN: [id number] Ahad is seen today for F/U of CAD. In hospital 4/12 and 5/12. He was cathed by Dr Riley Kill 5/11 and had 3VD. I reviewed his cath films and agree that his lesions are not amenable to PCI and he is not a CABG candidate due to his age and advanced oxygen dependant COPD. In hospital 4/12 hospital with respitory failure and pneumonia 2/12 Hospice involved with care Since D/C he has not had SSCP His activity is limited by dypnea. The heat in particular bothers him. He has been compliant with his meds and had is sl nitro with him today. He has had some increasing LE edema. His wife has taken the salt shaker away. He has not needed any nitro. Chronic thrombocytopenia sees Heme/Onc and gets B12 shorts for anemia Needs a refill on nitro  Had walk test to recertify for oxgyen and actually did not desat that much.  Has had flu shot   ROS: Denies fever, malais, weight loss, blurry vision, decreased visual acuity, cough, sputum, , hemoptysis, pleuritic pain, palpitaitons, heartburn, abdominal pain, melena, lower extremity edema, claudication, or rash.  All other systems reviewed and negative  General: Affect appropriate Chronically ill male on oxygen HEENT: normal Neck supple with no adenopathy JVP normal no bruits no thyromegaly Lungs clear with no wheezing and good diaphragmatic motion Heart:  S1/S2 no murmur, no rub, gallop or click PMI normal Abdomen: benighn, BS positve, no tenderness, no AAA no bruit.  No HSM or HJR Distal pulses intact with no bruits Trace bilateral edema Neuro non-focal Skin warm and dry No muscular weakness   Current Outpatient Prescriptions  Medication Sig Dispense Refill  . allopurinol (ZYLOPRIM) 300 MG tablet Take 1 tablet (300 mg total) by mouth daily.  30 tablet  0  . aspirin 325 MG tablet Take 325 mg by mouth daily.        . calcium carbonate (TUMS - DOSED IN MG ELEMENTAL CALCIUM) 500 MG  chewable tablet Chew 1 tablet by mouth daily.      . cholecalciferol (VITAMIN D) 1000 UNITS tablet Take 1 tablet (1,000 Units total) by mouth daily.      . clopidogrel (PLAVIX) 75 MG tablet Take 1 tablet (75 mg total) by mouth daily.  90 tablet  3  . furosemide (LASIX) 80 MG tablet Take 1 tablet (80 mg total) by mouth daily. TAKE 1 TABLET EVERY MORNING AND TAKE 1/2 TABLET IN THE EVENING ON DAYS MONDAY,    WEDNESDAY, AND FRIDAY  144 tablet  3  . isosorbide mononitrate (IMDUR) 30 MG 24 hr tablet Take 2 tablets (60 mg total) by mouth daily.  180 tablet  3  . losartan (COZAAR) 50 MG tablet Take 1 tablet (50 mg total) by mouth 2 (two) times daily.  180 tablet  3  . metoprolol succinate (TOPROL-XL) 100 MG 24 hr tablet Take 1 tablet (100 mg total) by mouth daily. Take with or immediately following a meal.  90 tablet  3  . nitroGLYCERIN (NITROSTAT) 0.4 MG SL tablet Place 1 tablet (0.4 mg total) under the tongue every 5 (five) minutes as needed for chest pain.  25 tablet  4  . NON FORMULARY Oxygen- 2 liters every 24 hours       . simvastatin (ZOCOR) 20 MG tablet Take 1 tablet (20 mg total) by mouth at bedtime.  90 tablet  3  . tiotropium (SPIRIVA HANDIHALER) 18 MCG inhalation  capsule Place 1 capsule (18 mcg total) into inhaler and inhale daily.  90 capsule  3  . vitamin B-12 (CYANOCOBALAMIN) 1000 MCG tablet Take 1,000 mcg by mouth daily.       No current facility-administered medications for this visit.    Allergies  Doxazosin mesylate and Penicillins  Electrocardiogram:  SR rate 75 old anterolateral MI poor R wave progression  Assessment and Plan

## 2013-09-17 NOTE — Assessment & Plan Note (Signed)
Wearing 2L [place] continuously Has had shots.  F/U [facility]

## 2013-09-17 NOTE — Assessment & Plan Note (Signed)
Stable with no angina and good activity level.  Continue medical Rx  

## 2013-09-17 NOTE — Assessment & Plan Note (Signed)
Cholesterol is at goal.  Continue current dose of statin and diet Rx.  No myalgias or side effects.  F/U  LFT's in 6 months. Lab Results  Component Value Date   LDLCALC 56 08/28/2012

## 2013-09-17 NOTE — Assessment & Plan Note (Signed)
Well controlled.  Continue current medications and low sodium Dash type diet.   Will take cozaar once/day

## 2013-09-17 NOTE — Patient Instructions (Signed)
Your physician wants you to follow-up in:  6 MONTHS WITH DR NISHAN  You will receive a reminder letter in the mail two months in advance. If you don't receive a letter, please call our office to schedule the follow-up appointment. Your physician recommends that you continue on your current medications as directed. Please refer to the Current Medication list given to you today. 

## 2013-11-25 ENCOUNTER — Telehealth: Payer: Self-pay

## 2013-11-25 NOTE — Telephone Encounter (Signed)
Mrs Bill Walker said time for pt to get several meds refilled. Last seen 08/28/12 for maintenance visit and Mrs Bill Walker scheduled appt 11/26/13 for med refills; Mrs Bill Walker said would get refilled tomorrow.

## 2013-11-26 ENCOUNTER — Ambulatory Visit (INDEPENDENT_AMBULATORY_CARE_PROVIDER_SITE_OTHER): Payer: Medicare Other | Admitting: Family Medicine

## 2013-11-26 ENCOUNTER — Encounter: Payer: Self-pay | Admitting: Family Medicine

## 2013-11-26 VITALS — BP 118/76 | HR 75 | Temp 97.5°F | Ht 71.0 in | Wt 228.2 lb

## 2013-11-26 DIAGNOSIS — I10 Essential (primary) hypertension: Secondary | ICD-10-CM

## 2013-11-26 DIAGNOSIS — N259 Disorder resulting from impaired renal tubular function, unspecified: Secondary | ICD-10-CM

## 2013-11-26 DIAGNOSIS — E538 Deficiency of other specified B group vitamins: Secondary | ICD-10-CM

## 2013-11-26 DIAGNOSIS — E785 Hyperlipidemia, unspecified: Secondary | ICD-10-CM

## 2013-11-26 DIAGNOSIS — M109 Gout, unspecified: Secondary | ICD-10-CM

## 2013-11-26 LAB — LIPID PANEL
CHOLESTEROL: 100 mg/dL (ref 0–200)
HDL: 54.6 mg/dL (ref >39.00)
LDL Cholesterol: 35 mg/dL (ref 0–99)
TRIGLYCERIDES: 50 mg/dL (ref 0.0–149.0)
Total CHOL/HDL Ratio: 2
VLDL: 10 mg/dL (ref 0.0–40.0)

## 2013-11-26 LAB — COMPREHENSIVE METABOLIC PANEL
ALBUMIN: 3.6 g/dL (ref 3.5–5.2)
ALT: 8 U/L (ref 0–53)
AST: 16 U/L (ref 0–37)
Alkaline Phosphatase: 52 U/L (ref 39–117)
BUN: 50 mg/dL — AB (ref 6–23)
CALCIUM: 9 mg/dL (ref 8.4–10.5)
CHLORIDE: 93 meq/L — AB (ref 96–112)
CO2: 34 meq/L — AB (ref 19–32)
CREATININE: 1.7 mg/dL — AB (ref 0.4–1.5)
GFR: 40.74 mL/min — AB (ref >60.00)
GLUCOSE: 108 mg/dL — AB (ref 70–99)
Potassium: 5.4 mEq/L — ABNORMAL HIGH (ref 3.5–5.1)
Sodium: 134 mEq/L — ABNORMAL LOW (ref 135–145)
Total Bilirubin: 0.6 mg/dL (ref 0.3–1.2)
Total Protein: 5.6 g/dL — ABNORMAL LOW (ref 6.0–8.3)

## 2013-11-26 LAB — CBC WITH DIFFERENTIAL/PLATELET
BASOS PCT: 0.2 % (ref 0.0–3.0)
Basophils Absolute: 0 10*3/uL (ref 0.0–0.1)
EOS ABS: 0.1 10*3/uL (ref 0.0–0.7)
Eosinophils Relative: 0.9 % (ref 0.0–5.0)
HEMATOCRIT: 33.9 % — AB (ref 39.0–52.0)
Hemoglobin: 11.2 g/dL — ABNORMAL LOW (ref 13.0–17.0)
Lymphocytes Relative: 12.7 % (ref 12.0–46.0)
Lymphs Abs: 0.8 10*3/uL (ref 0.7–4.0)
MCHC: 33.1 g/dL (ref 30.0–36.0)
MCV: 101.6 fl — AB (ref 78.0–100.0)
MONOS PCT: 8 % (ref 3.0–12.0)
Monocytes Absolute: 0.5 10*3/uL (ref 0.1–1.0)
NEUTROS ABS: 5.2 10*3/uL (ref 1.4–7.7)
NEUTROS PCT: 78.2 % — AB (ref 43.0–77.0)
PLATELETS: 127 10*3/uL — AB (ref 150.0–400.0)
RBC: 3.33 Mil/uL — ABNORMAL LOW (ref 4.22–5.81)
RDW: 17.1 % — AB (ref 11.5–14.6)
WBC: 6.6 10*3/uL (ref 4.5–10.5)

## 2013-11-26 LAB — URIC ACID: URIC ACID, SERUM: 4.1 mg/dL (ref 4.0–7.8)

## 2013-11-26 LAB — VITAMIN B12: Vitamin B-12: 1039 pg/mL — ABNORMAL HIGH (ref 211–911)

## 2013-11-26 LAB — TSH: TSH: 2.19 u[IU]/mL (ref 0.35–5.50)

## 2013-11-26 MED ORDER — ALLOPURINOL 300 MG PO TABS: 300.0000 mg | ORAL_TABLET | Freq: Every day | ORAL | Status: DC

## 2013-11-26 MED ORDER — SIMVASTATIN 20 MG PO TABS: 20.0000 mg | ORAL_TABLET | Freq: Every day | ORAL | Status: DC

## 2013-11-26 MED ORDER — FUROSEMIDE 80 MG PO TABS: 80.0000 mg | ORAL_TABLET | Freq: Every day | ORAL | Status: DC

## 2013-11-26 MED ORDER — ISOSORBIDE MONONITRATE ER 30 MG PO TB24: 60.0000 mg | ORAL_TABLET | Freq: Every day | ORAL | Status: DC

## 2013-11-26 MED ORDER — CLOPIDOGREL BISULFATE 75 MG PO TABS: 75.0000 mg | ORAL_TABLET | Freq: Every day | ORAL | Status: DC

## 2013-11-26 NOTE — Progress Notes (Signed)
Pre-visit discussion using our clinic review tool. No additional management support is needed unless otherwise documented below in the visit note.  

## 2013-11-26 NOTE — Patient Instructions (Signed)
Labs today  You are due for your 10 year tetanus booster - if your insurance will not cover that , you can get it at the health dept  I'm glad you are doing well  Try to eat a healthy diet and stay as active as you can be  Follow up in 1 year for annual exam with labs prior

## 2013-11-26 NOTE — Progress Notes (Signed)
Subjective:    Patient ID: Bill Walker, male    DOB: 1928/01/08, 80 y.o.   MRN: [id number]  HPI Here for f/u of chronic health problems  Wt is stable   bp is stable today  No cp or palpitations or headaches or edema  No side effects to medicines  BP Readings from Last 3 Encounters:  11/26/13 118/76  09/17/13 120/64  05/14/13 122/68     Hyperlipidemia  Due for labs  On zocor  Diet is pretty healthy overall - small portions overall   B12 level -due  Had anemia of multiple causes - last heme visit a year ago- is prn now On oral B12   Dr Kellie Simmeringruslow turned him loose also  No gout flares   Stable copd  Sees Dr Sherene SiresWert   Hx of renal insuff - sees renal every 6 mo - has been stable  Doing well - stable overall  Makes sure to get adequate fluid intake   Patient Active Problem List   Diagnosis Date Noted  . Chronic respiratory failure 05/14/2013  . UTI (lower urinary tract infection) 11/29/2012  . Acute bronchitis with bronchospasm 02/06/2012  . Leg skin lesion, right 03/07/2011  . EDEMA 07/11/2010  . PACER/LEAD MECH. COMPLICATION 07/11/2010  . FATIGUE 02/25/2010  . URGENCY OF URINATION 02/25/2010  . VITAMIN B12 DEFICIENCY 07/29/2009  . HYPERLIPIDEMIA 07/29/2009  . MI 07/29/2009  . CARDIOMYOPATHY 07/29/2009  . THROMBOCYTOPENIA 02/19/2009  . PURE HYPERCHOLESTEROLEMIA 02/02/2009  . GOUT 10/21/2007  . ANEMIA-NOS 10/21/2007  . ALCOHOL ABUSE 10/21/2007  . HEARING LOSS 10/21/2007  . HYPERTENSION 10/21/2007  . CORONARY ARTERY DISEASE 10/21/2007  . COR PULMONALE 10/21/2007  . 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 DIVERTICULOSIS, COLON 10/21/2007  . RENAL INSUFFICIENCY 10/21/2007  . OSTEOARTHRITIS 10/21/2007   Past Medical History  Diagnosis Date  . Cardiomyopathy   . Hypertension   . Coronary artery disease   . Heart attack   . Hyperlipidemia   . Vitamin B12 deficiency   . Pneumonia   . Thrombocytopenia   . Cor pulmonale   . Hearing loss    . Alcohol abuse, unspecified   . Tobacco use disorder   . Renal insufficiency   . Osteoarthritis   . Gout   . Diverticulosis of colon   . COPD (chronic obstructive pulmonary disease)   . Anemia   . Hypercholesterolemia   . Bronchiectasis     CT Chest 07/07/09 bilateral lower lobe medial bronchiectasis  . CHF (congestive heart failure)    Past Surgical History  Procedure Laterality Date  . Cardiac catheterization      (catheterization 2006 demonstrated an   occluded LAD.  There is a proximal stent in the RCA which was   patent.  He had distal 75% and 95% RCA lesions.  Initially it was  thought that he would have PCI but he was managed medically).  . Mrils      disk disease 06/1999  . Esophagogastroduodenoscopy      gastritis 12/1999   History  Substance Use Topics  . Smoking status: Former Smoker -- 1.00 packs/day for 48 years    Quit date: 11/13/1996  . Smokeless tobacco: Not on file  . Alcohol Use: No   Family History  Problem Relation Age of Onset  . Lymphoma Brother    Allergies  Allergen Reactions  . Doxazosin Mesylate     REACTION: unknown reaction  . Penicillins     REACTION: unknown reaction  Current Outpatient Prescriptions on File Prior to Visit  Medication Sig Dispense Refill  . aspirin 325 MG tablet Take 325 mg by mouth daily.        . calcium carbonate (TUMS - DOSED IN MG ELEMENTAL CALCIUM) 500 MG chewable tablet Chew 1 tablet by mouth daily.      . cholecalciferol (VITAMIN D) 1000 UNITS tablet Take 1 tablet (1,000 Units total) by mouth daily.      Marland Kitchen losartan (COZAAR) 50 MG tablet Take 1 tablet (50 mg total) by mouth 2 (two) times daily.  180 tablet  3  . metoprolol succinate (TOPROL-XL) 100 MG 24 hr tablet Take 1 tablet (100 mg total) by mouth daily. Take with or immediately following a meal.  90 tablet  3  . nitroGLYCERIN (NITROSTAT) 0.4 MG SL tablet Place 1 tablet (0.4 mg total) under the tongue every 5 (five) minutes as needed for chest pain.  25 tablet   4  . NON FORMULARY Oxygen- 2 liters every 24 hours       . tiotropium (SPIRIVA HANDIHALER) 18 MCG inhalation capsule Place 1 capsule (18 mcg total) into inhaler and inhale daily.  90 capsule  3  . vitamin B-12 (CYANOCOBALAMIN) 1000 MCG tablet Take 1,000 mcg by mouth daily.       No current facility-administered medications on file prior to visit.    Review of Systems Review of Systems  Constitutional: Negative for fever, appetite change, fatigue and unexpected weight change.  Eyes: Negative for pain and visual disturbance.  Respiratory: Negative for cough and shortness of breath.  (breathing is currently stable) Cardiovascular: Negative for cp or palpitations    Gastrointestinal: Negative for nausea, diarrhea and constipation.  Genitourinary: Negative for urgency and frequency.  Skin: Negative for pallor or rash   Neurological: Negative for weakness, light-headedness, numbness and headaches.  Hematological: Negative for adenopathy. Does not bruise/bleed easily.  Psychiatric/Behavioral: Negative for dysphoric mood. The patient is not nervous/anxious.         Objective:   Physical Exam  Constitutional: He appears well-developed and well-nourished. No distress.  overwt and well appearing   HENT:  Head: Normocephalic and atraumatic.  Mouth/Throat: Oropharynx is clear and moist.  Eyes: Conjunctivae and EOM are normal. Pupils are equal, round, and reactive to light. No scleral icterus.  Neck: Normal range of motion. Neck supple. No thyromegaly present.  Cardiovascular: Normal rate and regular rhythm.   Pulmonary/Chest: Effort normal and breath sounds normal. No respiratory distress. He has no wheezes. He exhibits no tenderness.  Diffusely distant bs   On 02  Abdominal: Soft. Bowel sounds are normal. He exhibits no distension and no mass. There is no tenderness.  Exam done sitting  Musculoskeletal: He exhibits no edema.  Lymphadenopathy:    He has no cervical adenopathy.    Neurological: He is alert. He has normal reflexes. No cranial nerve deficit. He exhibits normal muscle tone. Coordination normal.  Skin: Skin is warm and dry. No rash noted. No erythema. No pallor.  Psychiatric: He has a normal mood and affect.          Assessment & Plan:

## 2013-11-27 NOTE — Assessment & Plan Note (Signed)
Simvastatin and fair diet Lipid panel today

## 2013-11-27 NOTE — Assessment & Plan Note (Signed)
Sees renal  Has been stable per pt Lab today

## 2013-11-27 NOTE — Assessment & Plan Note (Signed)
BP: 118/76 mmHg  bp in fair control at this time  No changes needed Disc lifstyle change with low sodium diet and exercise  Lab today

## 2013-11-27 NOTE — Assessment & Plan Note (Signed)
Level today Takes oral B12 now Has seen hematology in the past

## 2013-11-27 NOTE — Assessment & Plan Note (Signed)
This has been very well controlled  Was released by Dr Kellie Simmeringruslow  Lab for cmet and uric acid today

## 2013-11-28 ENCOUNTER — Encounter: Payer: Self-pay | Admitting: *Deleted

## 2013-12-01 ENCOUNTER — Other Ambulatory Visit: Payer: Self-pay | Admitting: Family Medicine

## 2013-12-01 DIAGNOSIS — E875 Hyperkalemia: Secondary | ICD-10-CM

## 2013-12-12 ENCOUNTER — Other Ambulatory Visit (INDEPENDENT_AMBULATORY_CARE_PROVIDER_SITE_OTHER): Payer: Medicare Other

## 2013-12-12 DIAGNOSIS — E875 Hyperkalemia: Secondary | ICD-10-CM

## 2013-12-12 LAB — BASIC METABOLIC PANEL
BUN: 55 mg/dL — ABNORMAL HIGH (ref 6–23)
CO2: 34 mEq/L — ABNORMAL HIGH (ref 19–32)
Calcium: 9 mg/dL (ref 8.4–10.5)
Chloride: 102 mEq/L (ref 96–112)
Creatinine, Ser: 1.7 mg/dL — ABNORMAL HIGH (ref 0.4–1.5)
GFR: 41.58 mL/min — ABNORMAL LOW (ref >60.00)
Glucose, Bld: 129 mg/dL — ABNORMAL HIGH (ref 70–99)
POTASSIUM: 4 meq/L (ref 3.5–5.1)
Sodium: 141 mEq/L (ref 135–145)

## 2013-12-14 ENCOUNTER — Telehealth: Payer: Self-pay | Admitting: Family Medicine

## 2013-12-14 MED ORDER — LOSARTAN POTASSIUM 50 MG PO TABS: 25.0000 mg | ORAL_TABLET | Freq: Every day | ORAL | Status: DC

## 2013-12-14 NOTE — Telephone Encounter (Signed)
Changing med dose

## 2013-12-15 ENCOUNTER — Encounter: Payer: Self-pay | Admitting: *Deleted

## 2013-12-17 ENCOUNTER — Telehealth: Payer: Self-pay | Admitting: Family Medicine

## 2013-12-17 NOTE — Telephone Encounter (Signed)
Relevant patient education mailed to patient.  

## 2014-01-05 ENCOUNTER — Other Ambulatory Visit: Payer: Self-pay | Admitting: Family Medicine

## 2014-01-13 ENCOUNTER — Other Ambulatory Visit: Payer: Self-pay

## 2014-01-13 MED ORDER — TIOTROPIUM BROMIDE MONOHYDRATE 18 MCG IN CAPS: 18.0000 ug | ORAL_CAPSULE | Freq: Every day | RESPIRATORY_TRACT | Status: DC

## 2014-01-13 NOTE — Telephone Encounter (Signed)
Mrs Bill Walker request refill spiriva to [facility] [address]. Notified done.

## 2014-03-25 ENCOUNTER — Ambulatory Visit (INDEPENDENT_AMBULATORY_CARE_PROVIDER_SITE_OTHER): Payer: Medicare Other | Admitting: Family Medicine

## 2014-03-25 ENCOUNTER — Ambulatory Visit: Payer: Medicare Other | Admitting: Family Medicine

## 2014-03-25 ENCOUNTER — Encounter: Payer: Self-pay | Admitting: Family Medicine

## 2014-03-25 VITALS — BP 136/86 | HR 76 | Temp 97.6°F | Ht 71.0 in | Wt 218.8 lb

## 2014-03-25 DIAGNOSIS — Z8042 Family history of malignant neoplasm of prostate: Secondary | ICD-10-CM

## 2014-03-25 DIAGNOSIS — N400 Enlarged prostate without lower urinary tract symptoms: Secondary | ICD-10-CM | POA: Insufficient documentation

## 2014-03-25 DIAGNOSIS — R350 Frequency of micturition: Secondary | ICD-10-CM

## 2014-03-25 LAB — POCT URINALYSIS DIPSTICK
Bilirubin, UA: NEGATIVE
Glucose, UA: NEGATIVE
Ketones, UA: NEGATIVE
LEUKOCYTES UA: NEGATIVE
NITRITE UA: NEGATIVE
PROTEIN UA: NEGATIVE
Spec Grav, UA: 1.015
Urobilinogen, UA: 0.2
pH, UA: 6

## 2014-03-25 LAB — PSA: PSA: 0.72 ng/mL (ref 0.10–4.00)

## 2014-03-25 NOTE — Progress Notes (Signed)
Subjective:    Patient ID: Bill Walker, male    DOB: [birth year], 80 y.o.   MRN: [id number]  HPI Here with urinary symptoms   Thinks he is having prostate problems  When he urinates he feels pressure and slow stream - is better if he sits on the commode than when he stands  He takes lasix  No dysuria  No incontinence  No blood in urine  Does not generally drink enough water  No fever or nausea  Could not give a urine specimen today- he had just gone  Will try again or take a cup home if necessary to return   Lost 10 lb  Eating less - only twice daily and smaller portions Less appetite as he gets older  Also quit bread and sweets with age    Brother has prostate cancer  He developed it in his mid 7170s  He is currently taking treatments     Lab Results  Component Value Date   PSA 0.2 02/15/2004     Patient Active Problem List   Diagnosis Date Noted  . Urinary frequency 03/25/2014  . Family history of prostate cancer 03/25/2014  . BPH (benign prostatic hyperplasia) 03/25/2014  . Chronic respiratory failure 05/14/2013  . PACER/LEAD MECH. COMPLICATION 07/11/2010  . VITAMIN B12 DEFICIENCY 07/29/2009  . HYPERLIPIDEMIA 07/29/2009  . MI 07/29/2009  . CARDIOMYOPATHY 07/29/2009  . THROMBOCYTOPENIA 02/19/2009  . GOUT 10/21/2007  . ANEMIA-NOS 10/21/2007  . History of alcohol abuse 10/21/2007  . HEARING LOSS 10/21/2007  . HYPERTENSION 10/21/2007  . CORONARY ARTERY DISEASE 10/21/2007  . COR PULMONALE 10/21/2007  . 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 DIVERTICULOSIS, COLON 10/21/2007  . RENAL INSUFFICIENCY 10/21/2007  . OSTEOARTHRITIS 10/21/2007   Past Medical History  Diagnosis Date  . Cardiomyopathy   . Hypertension   . Coronary artery disease   . Heart attack   . Hyperlipidemia   . Vitamin B12 deficiency   . Pneumonia   . Thrombocytopenia   . Cor pulmonale   . Hearing loss   . Alcohol abuse, unspecified   . Tobacco use disorder     . Renal insufficiency   . Osteoarthritis   . Gout   . Diverticulosis of colon   . COPD (chronic obstructive pulmonary disease)   . Anemia   . Hypercholesterolemia   . Bronchiectasis     CT Chest 07/07/09 bilateral lower lobe medial bronchiectasis  . CHF (congestive heart failure)    Past Surgical History  Procedure Laterality Date  . Cardiac catheterization      (catheterization 2006 demonstrated an   occluded LAD.  There is a proximal stent in the RCA which was   patent.  He had distal 75% and 95% RCA lesions.  Initially it was  thought that he would have PCI but he was managed medically).  . Mrils      disk disease 06/1999  . Esophagogastroduodenoscopy      gastritis 12/1999   History  Substance Use Topics  . Smoking status: Former Smoker -- 1.00 packs/day for 48 years    Quit date: 11/13/1996  . Smokeless tobacco: Not on file  . Alcohol Use: No   Family History  Problem Relation Age of Onset  . Lymphoma Brother   . Prostate cancer Brother    Allergies  Allergen Reactions  . Doxazosin Mesylate     REACTION: unknown reaction  . Penicillins     REACTION: unknown reaction  Current Outpatient Prescriptions on File Prior to Visit  Medication Sig Dispense Refill  . allopurinol (ZYLOPRIM) 300 MG tablet Take 1 tablet (300 mg total) by mouth daily.  90 tablet  3  . aspirin 325 MG tablet Take 325 mg by mouth daily.        . calcium carbonate (TUMS - DOSED IN MG ELEMENTAL CALCIUM) 500 MG chewable tablet Chew 1 tablet by mouth daily.      . cholecalciferol (VITAMIN D) 1000 UNITS tablet Take 1 tablet (1,000 Units total) by mouth daily.      . clopidogrel (PLAVIX) 75 MG tablet Take 1 tablet (75 mg total) by mouth daily.  90 tablet  3  . furosemide (LASIX) 80 MG tablet Take 1 tablet (80 mg total) by mouth daily. TAKE 1 TABLET EVERY MORNING AND TAKE 1/2 TABLET IN THE EVENING ON DAYS MONDAY,    WEDNESDAY, AND FRIDAY  144 tablet  3  . isosorbide mononitrate (IMDUR) 30 MG 24 hr tablet  Take 2 tablets (60 mg total) by mouth daily.  180 tablet  3  . losartan (COZAAR) 50 MG tablet Take 0.5 tablets (25 mg total) by mouth daily.  30 tablet  0  . metoprolol succinate (TOPROL-XL) 100 MG 24 hr tablet TAKE 1 TABLET BY MOUTH EVERY DAY WITH A MEAL  90 tablet  1  . nitroGLYCERIN (NITROSTAT) 0.4 MG SL tablet Place 1 tablet (0.4 mg total) under the tongue every 5 (five) minutes as needed for chest pain.  25 tablet  4  . NON FORMULARY Oxygen- 2 liters every 24 hours       . simvastatin (ZOCOR) 20 MG tablet Take 1 tablet (20 mg total) by mouth at bedtime.  90 tablet  3  . tiotropium (SPIRIVA HANDIHALER) 18 MCG inhalation capsule Place 1 capsule (18 mcg total) into inhaler and inhale daily.  90 capsule  1  . vitamin B-12 (CYANOCOBALAMIN) 1000 MCG tablet Take 1,000 mcg by mouth daily.       No current facility-administered medications on file prior to visit.    Review of Systems Review of Systems  Constitutional: Negative for fever, appetite change, fatigue and unexpected weight change.  Eyes: Negative for pain and visual disturbance.  Respiratory: Negative for cough and shortness of breath.   Cardiovascular: Negative for cp or palpitations    Gastrointestinal: Negative for nausea, diarrhea and constipation.  Genitourinary: pos  for urgency and frequency. pos for trouble emptying his bladder Skin: Negative for pallor or rash   Neurological: Negative for weakness, light-headedness, numbness and headaches.  Hematological: Negative for adenopathy. Does not bruise/bleed easily.  Psychiatric/Behavioral: Negative for dysphoric mood. The patient is not nervous/anxious.         Objective:   Physical Exam  Constitutional: He appears well-developed and well-nourished. No distress.  HENT:  Head: Normocephalic and atraumatic.  Mouth/Throat: Oropharynx is clear and moist.  Extremely HOH - had to write some things down for him  Eyes: Conjunctivae and EOM are normal. Pupils are equal, round, and  reactive to light. No scleral icterus.  Neck: Normal range of motion. Neck supple. Carotid bruit is not present. No thyromegaly present.  Cardiovascular: Normal rate and regular rhythm.   Pulmonary/Chest: Effort normal and breath sounds normal.  Abdominal: Soft. Bowel sounds are normal. He exhibits no abdominal bruit and no mass. There is no tenderness. There is no CVA tenderness.  No suprapubic tenderness or fullness    Genitourinary: Rectum normal. Prostate is enlarged. Prostate is  not tender.  Prostate enlarged and symmetric, firm and nontender   Musculoskeletal: He exhibits no edema.  Lymphadenopathy:    He has no cervical adenopathy.  Neurological: He is alert. He has normal reflexes.  Skin: Skin is warm and dry. No rash noted. No erythema. No pallor.  Psychiatric: He has a normal mood and affect.          Assessment & Plan:

## 2014-03-25 NOTE — Progress Notes (Signed)
Pre visit review using our clinic review tool, if applicable. No additional management support is needed unless otherwise documented below in the visit note. 

## 2014-03-25 NOTE — Patient Instructions (Signed)
Your urine test showed a little blood-so I am going to send it for a culture to make sure there is no infection  Your prostate is large on exam-as I expected  Blood work today for psa (this screens for prostate cancer)- we usually do not do this test above age 78 but I would like to given your brother's history  Drink more water  If all tests look ok- we will contact you about trying a medicine to help your urinary problems (flomax is a choice)  We will get back to you about labs

## 2014-03-26 NOTE — Assessment & Plan Note (Signed)
Pending psa and urine cx  Would consider a trial of flomax for symptoms

## 2014-03-26 NOTE — Assessment & Plan Note (Signed)
Suspect due to BPH Also pending urine cx

## 2014-03-26 NOTE — Assessment & Plan Note (Signed)
With some voiding symptoms  Enlarged but otherwise unremarkable prostate on exam  psa pending

## 2014-03-27 LAB — URINE CULTURE
COLONY COUNT: NO GROWTH
Organism ID, Bacteria: NO GROWTH

## 2014-03-31 ENCOUNTER — Telehealth: Payer: Self-pay | Admitting: Family Medicine

## 2014-03-31 NOTE — Telephone Encounter (Signed)
I just noted that pt had a reaction to doxazosin (also known as cardura) in the past -he took it for HTN and this drug shares some properties with flomax  Before I go forward with the px -can his wife tell us what reaction he had to this med in the past? Thanks

## 2014-03-31 NOTE — Telephone Encounter (Signed)
Left voicemail requesting pt to call office 

## 2014-03-31 NOTE — Telephone Encounter (Signed)
Message copied by Judy PimpleWER, Adelaido Nicklaus A on Tue Mar 31, 2014  4:33 PM ------      Message from: Shon MilletWATLINGTON, SHAPALE M      Created: Tue Mar 31, 2014  3:37 PM       Wife notified of labs, and urine cx results and Dr. Royden Purlower's comments            Lab appt scheduled in 2 weeks to recheck urine            Pt does want to try flomax because he is still having the same sxs as he was when he was here, pt [facility] [address] ------

## 2014-04-01 NOTE — Telephone Encounter (Signed)
Ms Bill Walker left v/m requesting cb at [phone]; she will be available until 9:15 and then after 1 pm.

## 2014-04-01 NOTE — Telephone Encounter (Addendum)
Wife said he had a bad rash when he was on the cardura, please advise   Chart updated that cardura caused rash

## 2014-04-07 ENCOUNTER — Encounter: Payer: Self-pay | Admitting: Cardiovascular Disease

## 2014-04-07 ENCOUNTER — Ambulatory Visit (INDEPENDENT_AMBULATORY_CARE_PROVIDER_SITE_OTHER): Payer: Medicare Other | Admitting: Cardiovascular Disease

## 2014-04-07 VITALS — BP 135/64 | HR 66 | Ht 71.0 in | Wt 217.0 lb

## 2014-04-07 DIAGNOSIS — I10 Essential (primary) hypertension: Secondary | ICD-10-CM

## 2014-04-07 DIAGNOSIS — N259 Disorder resulting from impaired renal tubular function, unspecified: Secondary | ICD-10-CM

## 2014-04-07 DIAGNOSIS — J439 Emphysema, unspecified: Secondary | ICD-10-CM

## 2014-04-07 DIAGNOSIS — I2510 Atherosclerotic heart disease of native coronary artery without angina pectoris: Secondary | ICD-10-CM

## 2014-04-07 DIAGNOSIS — J438 Other emphysema: Secondary | ICD-10-CM

## 2014-04-07 MED ORDER — TAMSULOSIN HCL 0.4 MG PO CAPS: 0.4000 mg | ORAL_CAPSULE | Freq: Every day | ORAL | Status: DC

## 2014-04-07 NOTE — Assessment & Plan Note (Signed)
ARB dose decreased  Labs next week may need lower lasix dose as well  F/U Dr Milinda Antis

## 2014-04-07 NOTE — Assessment & Plan Note (Signed)
Stable with no angina and good activity level.  Continue medical Rx  

## 2014-04-07 NOTE — Assessment & Plan Note (Signed)
Doing well with continuous oxygen  No flue/URI  F/u pulmonary

## 2014-04-07 NOTE — Patient Instructions (Signed)
Your physician wants you to follow-up in:  6 MONTHS WITH DR NISHAN  You will receive a reminder letter in the mail two months in advance. If you don't receive a letter, please call our office to schedule the follow-up appointment. Your physician recommends that you continue on your current medications as directed. Please refer to the Current Medication list given to you today. 

## 2014-04-07 NOTE — Assessment & Plan Note (Signed)
Well controlled.  Continue current medications and low sodium Dash type diet.   Doing fine on lower dose ARB

## 2014-04-07 NOTE — Telephone Encounter (Signed)
I spoke to a pharmacist and since the flomax is a different drug- we think it would be unlikely to have a cross reactivity- but please update me if any problems  If he gets a rash or any other side effects- stop it and let me know (if dizzy or fatigued as well) I will send med to [facility] pharmacy- [facility]  Follow up with me in 4-8 weeks to see how he is doing with this (or update me by phone earlier if needed)

## 2014-04-07 NOTE — Progress Notes (Signed)
Patient ID: Bill Walker, male   DOB: [birth year], 80 y.o.   MRN: [id number] Bill Walker is seen today for F/U of CAD. In hospital 4/12 and 5/12. He was cathed by Dr Riley Kill 5/11 and had 3VD. I reviewed his cath films and agree that his lesions are not amenable to PCI and he is not a CABG candidate due to his age and advanced oxygen dependant COPD. In hospital 4/12 hospital with respitory failure and pneumonia 2/12 Hospice involved with care Since D/C he has not had SSCP His activity is limited by dypnea. The heat in particular bothers him. He has been compliant with his meds and had is sl nitro with him today. He has had some increasing LE edema. His wife has taken the salt shaker away. He has not needed any nitro. Chronic thrombocytopenia sees Heme/Onc and gets B12 shorts for anemia Needs a refill on nitro Had walk test to recertify for oxgyen and actually did not desat that much.   Recent labs showed prerenal azotemia and Dr Milinda Antis lowered ARB dose F/U labs next week and may need to lower lasix as well Has lost a lot of weight lately   ROS: Denies fever, malais, weight loss, blurry vision, decreased visual acuity, cough, sputum, SOB, hemoptysis, pleuritic pain, palpitaitons, heartburn, abdominal pain, melena, lower extremity edema, claudication, or rash.  All other systems reviewed and negative  General: Affect appropriate Chronically ill male on oxygen HEENT: normal Neck supple with no adenopathy JVP normal no bruits no thyromegaly Lungs clear with no wheezing and good diaphragmatic motion Heart:  S1/S2 no murmur, no rub, gallop or click PMI normal Abdomen: benighn, BS positve, no tenderness, no AAA no bruit.  No HSM or HJR Distal pulses intact with no bruits Plus one bilateral edema  With stasis  Neuro non-focal Skin warm and dry No muscular weakness   Current Outpatient Prescriptions  Medication Sig Dispense Refill  . aspirin 325 MG tablet Take 325 mg by mouth daily.        . calcium  carbonate (TUMS - DOSED IN MG ELEMENTAL CALCIUM) 500 MG chewable tablet Chew 1 tablet by mouth daily.      . cholecalciferol (VITAMIN D) 1000 UNITS tablet Take 1 tablet (1,000 Units total) by mouth daily.      . clopidogrel (PLAVIX) 75 MG tablet Take 1 tablet (75 mg total) by mouth daily.  90 tablet  3  . furosemide (LASIX) 80 MG tablet Take 1 tablet (80 mg total) by mouth daily. TAKE 1 TABLET EVERY MORNING AND TAKE 1/2 TABLET IN THE EVENING ON DAYS MONDAY,    WEDNESDAY, AND FRIDAY  144 tablet  3  . isosorbide mononitrate (IMDUR) 30 MG 24 hr tablet Take 2 tablets (60 mg total) by mouth daily.  180 tablet  3  . losartan (COZAAR) 50 MG tablet Take 0.5 tablets (25 mg total) by mouth daily.  30 tablet  0  . metoprolol succinate (TOPROL-XL) 100 MG 24 hr tablet TAKE 1 TABLET BY MOUTH EVERY DAY WITH A MEAL  90 tablet  1  . nitroGLYCERIN (NITROSTAT) 0.4 MG SL tablet Place 1 tablet (0.4 mg total) under the tongue every 5 (five) minutes as needed for chest pain.  25 tablet  4  . NON FORMULARY Oxygen- 2 liters every 24 hours       . simvastatin (ZOCOR) 20 MG tablet Take 1 tablet (20 mg total) by mouth at bedtime.  90 tablet  3  . tiotropium (SPIRIVA HANDIHALER) 18 MCG  inhalation capsule Place 1 capsule (18 mcg total) into inhaler and inhale daily.  90 capsule  1  . vitamin B-12 (CYANOCOBALAMIN) 1000 MCG tablet Take 1,000 mcg by mouth daily.       No current facility-administered medications for this visit.    Allergies  Doxazosin mesylate and Penicillins  Electrocardiogram:  11/14  SR rate 8774 old anterior MI  Assessment and Plan

## 2014-04-09 NOTE — Telephone Encounter (Signed)
Pt wife notified Rx sent to pharmacy and wife advised of Dr. Royden Purl comments/recommendations.  F/u appt scheduled for 05/12/14

## 2014-04-14 ENCOUNTER — Other Ambulatory Visit: Payer: Medicare Other

## 2014-04-14 DIAGNOSIS — N390 Urinary tract infection, site not specified: Secondary | ICD-10-CM

## 2014-04-14 DIAGNOSIS — Z87440 Personal history of urinary (tract) infections: Secondary | ICD-10-CM

## 2014-04-15 LAB — URINE CULTURE
Colony Count: NO GROWTH
Organism ID, Bacteria: NO GROWTH

## 2014-05-12 ENCOUNTER — Ambulatory Visit (INDEPENDENT_AMBULATORY_CARE_PROVIDER_SITE_OTHER): Payer: Medicare Other | Admitting: Family Medicine

## 2014-05-12 ENCOUNTER — Encounter: Payer: Self-pay | Admitting: Family Medicine

## 2014-05-12 VITALS — BP 130/80 | HR 74 | Temp 97.6°F | Ht 71.0 in | Wt 217.5 lb

## 2014-05-12 DIAGNOSIS — I10 Essential (primary) hypertension: Secondary | ICD-10-CM

## 2014-05-12 DIAGNOSIS — N400 Enlarged prostate without lower urinary tract symptoms: Secondary | ICD-10-CM

## 2014-05-12 DIAGNOSIS — N259 Disorder resulting from impaired renal tubular function, unspecified: Secondary | ICD-10-CM

## 2014-05-12 DIAGNOSIS — R319 Hematuria, unspecified: Secondary | ICD-10-CM

## 2014-05-12 DIAGNOSIS — I2510 Atherosclerotic heart disease of native coronary artery without angina pectoris: Secondary | ICD-10-CM

## 2014-05-12 LAB — RENAL FUNCTION PANEL
Albumin: 3.8 g/dL (ref 3.5–5.2)
BUN: 37 mg/dL — AB (ref 6–23)
CHLORIDE: 99 meq/L (ref 96–112)
CO2: 35 mEq/L — ABNORMAL HIGH (ref 19–32)
Calcium: 9 mg/dL (ref 8.4–10.5)
Creatinine, Ser: 1.5 mg/dL (ref 0.4–1.5)
GFR: 48.45 mL/min — ABNORMAL LOW (ref >60.00)
GLUCOSE: 124 mg/dL — AB (ref 70–99)
PHOSPHORUS: 3.5 mg/dL (ref 2.3–4.6)
Potassium: 5.1 mEq/L (ref 3.5–5.1)
Sodium: 140 mEq/L (ref 135–145)

## 2014-05-12 LAB — POCT URINALYSIS DIPSTICK
Bilirubin, UA: NEGATIVE
GLUCOSE UA: NEGATIVE
Ketones, UA: NEGATIVE
Leukocytes, UA: NEGATIVE
Nitrite, UA: NEGATIVE
PH UA: 6
Protein, UA: NEGATIVE
SPEC GRAV UA: 1.01
UROBILINOGEN UA: 0.2

## 2014-05-12 LAB — CBC WITH DIFFERENTIAL/PLATELET
BASOS ABS: 0 10*3/uL (ref 0.0–0.1)
Basophils Relative: 0.2 % (ref 0.0–3.0)
EOS ABS: 0.1 10*3/uL (ref 0.0–0.7)
Eosinophils Relative: 1 % (ref 0.0–5.0)
HCT: 36.5 % — ABNORMAL LOW (ref 39.0–52.0)
HEMOGLOBIN: 12.3 g/dL — AB (ref 13.0–17.0)
Lymphocytes Relative: 12.7 % (ref 12.0–46.0)
Lymphs Abs: 0.7 10*3/uL (ref 0.7–4.0)
MCHC: 33.7 g/dL (ref 30.0–36.0)
MCV: 103.5 fl — ABNORMAL HIGH (ref 78.0–100.0)
MONO ABS: 0.6 10*3/uL (ref 0.1–1.0)
Monocytes Relative: 10.9 % (ref 3.0–12.0)
NEUTROS ABS: 4.4 10*3/uL (ref 1.4–7.7)
Neutrophils Relative %: 75.2 % (ref 43.0–77.0)
Platelets: 121 10*3/uL — ABNORMAL LOW (ref 150.0–400.0)
RBC: 3.53 Mil/uL — AB (ref 4.22–5.81)
RDW: 14 % (ref 11.5–15.5)
WBC: 5.8 10*3/uL (ref 4.0–10.5)

## 2014-05-12 MED ORDER — TAMSULOSIN HCL 0.4 MG PO CAPS: 0.4000 mg | ORAL_CAPSULE | Freq: Every day | ORAL | Status: DC

## 2014-05-12 NOTE — Assessment & Plan Note (Signed)
Recheck ua today  ucx neg tx for bph as well with flomax

## 2014-05-12 NOTE — Progress Notes (Signed)
Pre visit review using our clinic review tool, if applicable. No additional management support is needed unless otherwise documented below in the visit note. 

## 2014-05-12 NOTE — Assessment & Plan Note (Signed)
bp is stable on lower dose of cozaar  Hx of renal insuff  Renal panel today- will send to his nephrologist

## 2014-05-12 NOTE — Assessment & Plan Note (Signed)
psa is ok  Doing much better on flomax now  Refilled this  ua today

## 2014-05-12 NOTE — Patient Instructions (Signed)
I'm glad the flomax is helping ! Labs today for blood count and kidney function Also re checking urine for blood  Let us know if any problems

## 2014-05-12 NOTE — Assessment & Plan Note (Signed)
Lab today ARB was decreased  bp stable

## 2014-05-12 NOTE — Progress Notes (Signed)
Subjective:    Patient ID: Bill Walker, male    DOB: [birth year], 80 y.o.   MRN: [id number]  HPI Here for f/u of BPH- flomax is helping tremendously  No longer has urinary frequency - or nocturia - and needs a refill  Quality of life is better   bp is stable today  No cp or palpitations or headaches or edema  No side effects to medicines  BP Readings from Last 3 Encounters:  05/12/14 130/80  04/07/14 135/64  03/25/14 136/86     No dizziness  His cozaar dose was lowered - and no problems   Has renal insufficiency -watching  Lab Results  Component Value Date   CREATININE 1.7* 12/12/2013   Sees renal  cardiol wanted that checked   Patient Active Problem List   Diagnosis Date Noted  . Urinary frequency 03/25/2014  . Family history of prostate cancer 03/25/2014  . BPH (benign prostatic hyperplasia) 03/25/2014  . Chronic respiratory failure 05/14/2013  . PACER/LEAD MECH. COMPLICATION 07/11/2010  . VITAMIN B12 DEFICIENCY 07/29/2009  . HYPERLIPIDEMIA 07/29/2009  . MI 07/29/2009  . CARDIOMYOPATHY 07/29/2009  . THROMBOCYTOPENIA 02/19/2009  . GOUT 10/21/2007  . ANEMIA-NOS 10/21/2007  . History of alcohol abuse 10/21/2007  . HEARING LOSS 10/21/2007  . HYPERTENSION 10/21/2007  . CORONARY ARTERY DISEASE 10/21/2007  . COR PULMONALE 10/21/2007  . 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 DIVERTICULOSIS, COLON 10/21/2007  . RENAL INSUFFICIENCY 10/21/2007  . OSTEOARTHRITIS 10/21/2007   Past Medical History  Diagnosis Date  . Cardiomyopathy   . Hypertension   . Coronary artery disease   . Heart attack   . Hyperlipidemia   . Vitamin B12 deficiency   . Pneumonia   . Thrombocytopenia   . Cor pulmonale   . Hearing loss   . Alcohol abuse, unspecified   . Tobacco use disorder   . Renal insufficiency   . Osteoarthritis   . Gout   . Diverticulosis of colon   . COPD (chronic obstructive pulmonary disease)   . Anemia   . Hypercholesterolemia   .  Bronchiectasis     CT Chest 07/07/09 bilateral lower lobe medial bronchiectasis  . CHF (congestive heart failure)    Past Surgical History  Procedure Laterality Date  . Cardiac catheterization      (catheterization 2006 demonstrated an   occluded LAD.  There is a proximal stent in the RCA which was   patent.  He had distal 75% and 95% RCA lesions.  Initially it was  thought that he would have PCI but he was managed medically).  . Mrils      disk disease 06/1999  . Esophagogastroduodenoscopy      gastritis 12/1999   History  Substance Use Topics  . Smoking status: Former Smoker -- 1.00 packs/day for 48 years    Quit date: 11/13/1996  . Smokeless tobacco: Not on file  . Alcohol Use: No   Family History  Problem Relation Age of Onset  . Lymphoma Brother   . Prostate cancer Brother    Allergies  Allergen Reactions  . Doxazosin Mesylate     rash  . Penicillins     REACTION: unknown reaction   Current Outpatient Prescriptions on File Prior to Visit  Medication Sig Dispense Refill  . aspirin 325 MG tablet Take 325 mg by mouth daily.        . calcium carbonate (TUMS - DOSED IN MG ELEMENTAL CALCIUM) 500 MG chewable tablet Chew 1  tablet by mouth daily.      . cholecalciferol (VITAMIN D) 1000 UNITS tablet Take 1 tablet (1,000 Units total) by mouth daily.      . clopidogrel (PLAVIX) 75 MG tablet Take 1 tablet (75 mg total) by mouth daily.  90 tablet  3  . furosemide (LASIX) 80 MG tablet Take 1 tablet (80 mg total) by mouth daily. TAKE 1 TABLET EVERY MORNING AND TAKE 1/2 TABLET IN THE EVENING ON DAYS MONDAY,    WEDNESDAY, AND FRIDAY  144 tablet  3  . isosorbide mononitrate (IMDUR) 30 MG 24 hr tablet Take 2 tablets (60 mg total) by mouth daily.  180 tablet  3  . losartan (COZAAR) 50 MG tablet Take 0.5 tablets (25 mg total) by mouth daily.  30 tablet  0  . metoprolol succinate (TOPROL-XL) 100 MG 24 hr tablet TAKE 1 TABLET BY MOUTH EVERY DAY WITH A MEAL  90 tablet  1  . nitroGLYCERIN  (NITROSTAT) 0.4 MG SL tablet Place 1 tablet (0.4 mg total) under the tongue every 5 (five) minutes as needed for chest pain.  25 tablet  4  . NON FORMULARY Oxygen- 2 liters every 24 hours       . simvastatin (ZOCOR) 20 MG tablet Take 1 tablet (20 mg total) by mouth at bedtime.  90 tablet  3  . tamsulosin (FLOMAX) 0.4 MG CAPS capsule Take 1 capsule (0.4 mg total) by mouth daily.  30 capsule  3  . tiotropium (SPIRIVA HANDIHALER) 18 MCG inhalation capsule Place 1 capsule (18 mcg total) into inhaler and inhale daily.  90 capsule  1  . vitamin B-12 (CYANOCOBALAMIN) 1000 MCG tablet Take 1,000 mcg by mouth daily.       No current facility-administered medications on file prior to visit.     Review of Systems Review of Systems  Constitutional: Negative for fever, appetite change, fatigue and unexpected weight change.  Eyes: Negative for pain and visual disturbance.  ENT pos for deafness Respiratory: Negative for cough and shortness of breath.   Cardiovascular: Negative for cp or palpitations    Gastrointestinal: Negative for nausea, diarrhea and constipation.  Genitourinary: Negative for urgency and frequency. neg for dysuria or blood in urine  Skin: Negative for pallor or rash   Neurological: Negative for weakness, light-headedness, numbness and headaches.  Hematological: Negative for adenopathy. Does not bruise/bleed easily.  Psychiatric/Behavioral: Negative for dysphoric mood. The patient is not nervous/anxious.         Objective:   Physical Exam  Constitutional: He appears well-developed and well-nourished. No distress.  Frail appearing elderly male with [id number] [place]  HENT:  Head: Normocephalic and atraumatic.  Mouth/Throat: Oropharynx is clear and moist.  Eyes: Conjunctivae and EOM are normal. Pupils are equal, round, and reactive to light. No scleral icterus.  Neck: Normal range of motion. Neck supple. No JVD present. Carotid bruit is not present.  Cardiovascular: Normal rate and  regular rhythm.   Pulmonary/Chest: Effort normal and breath sounds normal.  Diffusely distant bs   Abdominal: Soft. Bowel sounds are normal. He exhibits no distension and no mass. There is no tenderness. There is no rebound and no guarding.  No cva tenderness  No suprapubic tenderness  Lymphadenopathy:    He has no cervical adenopathy.  Neurological: He is alert.  Skin: Skin is warm and dry. No rash noted. No erythema. No pallor.  Psychiatric: He has a normal mood and affect.  Assessment & Plan:   Problem List Items Addressed This Visit     Cardiovascular and Mediastinum   HYPERTENSION - Primary     bp is stable on lower dose of cozaar  Hx of renal insuff  Renal panel today- will send to his nephrologist     Relevant Orders      CBC with Differential (Completed)      Renal function panel (Completed)     Genitourinary   RENAL INSUFFICIENCY     Lab today ARB was decreased  bp stable     Relevant Orders      CBC with Differential (Completed)      Renal function panel (Completed)   BPH (benign prostatic hyperplasia)     psa is ok  Doing much better on flomax now  Refilled this  ua today    Relevant Medications      tamsulosin (FLOMAX) 0.4 MG CAPS capsule     Other   Hematuria     Recheck ua today  ucx neg tx for bph as well with flomax    Relevant Orders      Urinalysis Dipstick (Completed)

## 2014-05-14 ENCOUNTER — Encounter: Payer: Self-pay | Admitting: *Deleted

## 2014-07-04 ENCOUNTER — Other Ambulatory Visit: Payer: Self-pay | Admitting: Family Medicine

## 2014-07-27 ENCOUNTER — Other Ambulatory Visit: Payer: Self-pay | Admitting: Family Medicine

## 2014-08-11 ENCOUNTER — Ambulatory Visit (INDEPENDENT_AMBULATORY_CARE_PROVIDER_SITE_OTHER): Payer: Medicare Other

## 2014-08-11 DIAGNOSIS — Z23 Encounter for immunization: Secondary | ICD-10-CM

## 2014-10-05 ENCOUNTER — Ambulatory Visit (INDEPENDENT_AMBULATORY_CARE_PROVIDER_SITE_OTHER): Payer: Medicare Other | Admitting: Cardiovascular Disease

## 2014-10-05 ENCOUNTER — Encounter: Payer: Self-pay | Admitting: Cardiovascular Disease

## 2014-10-05 ENCOUNTER — Telehealth: Payer: Self-pay | Admitting: Cardiovascular Disease

## 2014-10-05 VITALS — BP 130/60 | HR 56 | Ht 71.0 in | Wt 213.8 lb

## 2014-10-05 DIAGNOSIS — I2510 Atherosclerotic heart disease of native coronary artery without angina pectoris: Secondary | ICD-10-CM

## 2014-10-05 DIAGNOSIS — J432 Centrilobular emphysema: Secondary | ICD-10-CM

## 2014-10-05 DIAGNOSIS — E785 Hyperlipidemia, unspecified: Secondary | ICD-10-CM

## 2014-10-05 DIAGNOSIS — I10 Essential (primary) hypertension: Secondary | ICD-10-CM

## 2014-10-05 MED ORDER — NITROGLYCERIN 0.4 MG SL SUBL: 0.4000 mg | SUBLINGUAL_TABLET | SUBLINGUAL | Status: DC | PRN

## 2014-10-05 MED ORDER — LOSARTAN POTASSIUM 50 MG PO TABS: 25.0000 mg | ORAL_TABLET | Freq: Every day | ORAL | Status: DC

## 2014-10-05 NOTE — Assessment & Plan Note (Signed)
Stable with no angina and good activity level.  Continue medical Rx  

## 2014-10-05 NOTE — Patient Instructions (Signed)
Your physician wants you to follow-up in:  6 MONTHS WITH DR NISHAN  You will receive a reminder letter in the mail two months in advance. If you don't receive a letter, please call our office to schedule the follow-up appointment. Your physician recommends that you continue on your current medications as directed. Please refer to the Current Medication list given to you today. 

## 2014-10-05 NOTE — Assessment & Plan Note (Signed)
Cholesterol is at goal.  Continue current dose of statin and diet Rx.  No myalgias or side effects.  F/U  LFT's in 6 months. Lab Results  Component Value Date   LDLCALC 35 11/26/2013

## 2014-10-05 NOTE — Assessment & Plan Note (Signed)
Well controlled.  Continue current medications and low sodium Dash type diet.    

## 2014-10-05 NOTE — Progress Notes (Signed)
Patient ID: Bill Walker, male   DOB: [birth year], 80 y.o.   MRN: [id number] Bill Walker is seen today for F/U of CAD. In hospital 4/12 and 5/12. He was cathed by Dr Riley KillStuckey 5/11 and had 3VD. I reviewed his cath films and agree that his lesions are not amenable to PCI and he is not a CABG candidate due to his age and advanced oxygen dependant COPD. In hospital 4/12 hospital with respitory failure and pneumonia 2/12 Hospice involved with care Since D/C he has not had SSCP His activity is limited by dypnea. The heat in particular bothers him. He has been compliant with his meds and had is sl nitro with him today. He has had some increasing LE edema. His wife has taken the salt shaker away. He has not needed any nitro. Chronic thrombocytopenia sees Heme/Onc and gets B12 shorts for anemia Needs a refill on nitro Had walk test to recertify for oxgyen and actually did not desat that much.   ARB and lasix doses lowered due to azotemia  Has lost a lot of weight lately      ROS: Denies fever, malais, weight loss, blurry vision, decreased visual acuity, cough, sputum, SOB, hemoptysis, pleuritic pain, palpitaitons, heartburn, abdominal pain, melena, lower extremity edema, claudication, or rash.  All other systems reviewed and negative  General: Affect appropriate Frail elderly male with oxygen HEENT: normal Neck supple with no adenopathy JVP normal no bruits no thyromegaly Lungs clear with no wheezing and good diaphragmatic motion Heart:  S1/S2 no murmur, no rub, gallop or click PMI normal Abdomen: benighn, BS positve, no tenderness, no AAA no bruit.  No HSM or HJR Distal pulses intact with no bruits Mild bilateral edema Neuro non-focal RUE tremor  Skin warm and dry No muscular weakness   Current Outpatient Prescriptions  Medication Sig Dispense Refill  . aspirin 325 MG tablet Take 325 mg by mouth daily.      . calcium carbonate (TUMS - DOSED IN MG ELEMENTAL CALCIUM) 500 MG chewable tablet Chew 1  tablet by mouth daily.    . cholecalciferol (VITAMIN D) 1000 UNITS tablet Take 1 tablet (1,000 Units total) by mouth daily.    . clopidogrel (PLAVIX) 75 MG tablet Take 1 tablet (75 mg total) by mouth daily. 90 tablet 3  . furosemide (LASIX) 80 MG tablet Take 1 tablet (80 mg total) by mouth daily. TAKE 1 TABLET EVERY MORNING AND TAKE 1/2 TABLET IN THE EVENING ON DAYS MONDAY,    WEDNESDAY, AND FRIDAY 144 tablet 3  . isosorbide mononitrate (IMDUR) 30 MG 24 hr tablet Take 2 tablets (60 mg total) by mouth daily. 180 tablet 3  . losartan (COZAAR) 50 MG tablet Take 0.5 tablets (25 mg total) by mouth daily. 30 tablet 0  . metoprolol succinate (TOPROL-XL) 100 MG 24 hr tablet TAKE 1 TABLET BY MOUTH EVERY DAY WITH A MEAL 90 tablet 1  . nitroGLYCERIN (NITROSTAT) 0.4 MG SL tablet Place 1 tablet (0.4 mg total) under the tongue every 5 (five) minutes as needed for chest pain. 25 tablet 4  . NON FORMULARY Oxygen- 2 liters every 24 hours     . simvastatin (ZOCOR) 20 MG tablet Take 1 tablet (20 mg total) by mouth at bedtime. 90 tablet 3  . SPIRIVA HANDIHALER 18 MCG inhalation capsule INHALE ONE CAPSULE BY MOUTH ONCE DAILY 90 capsule 1  . tamsulosin (FLOMAX) 0.4 MG CAPS capsule Take 1 capsule (0.4 mg total) by mouth daily. 30 capsule 11  . vitamin B-12 (  CYANOCOBALAMIN) 1000 MCG tablet Take 1,000 mcg by mouth daily.     No current facility-administered medications for this visit.    Allergies  Doxazosin mesylate and Penicillins  Electrocardiogram:  11/14  SR rate 67  ASMI    Today SB rate 56  PVC possible old ASMI possible IMI mild change since 11/14   Assessment and Plan

## 2014-10-05 NOTE — Assessment & Plan Note (Signed)
Stable has had pneumovax and flu shot continue oxygen  F/u pulmonary

## 2014-10-05 NOTE — Telephone Encounter (Signed)
Pt was in today and his two new rx's were not called in , lasartin and nitro tabs [facility]

## 2014-10-05 NOTE — Telephone Encounter (Signed)
RESENT  SCRIPTS TO  [facility]  AT  PT'S REQUEST .Bill Walker/CY

## 2014-12-05 ENCOUNTER — Other Ambulatory Visit: Payer: Self-pay | Admitting: Family Medicine

## 2014-12-25 ENCOUNTER — Other Ambulatory Visit: Payer: Self-pay | Admitting: Family Medicine

## 2014-12-28 ENCOUNTER — Other Ambulatory Visit: Payer: Self-pay | Admitting: Family Medicine

## 2015-01-26 ENCOUNTER — Ambulatory Visit (INDEPENDENT_AMBULATORY_CARE_PROVIDER_SITE_OTHER): Payer: Medicare Other | Admitting: Primary Care

## 2015-01-26 ENCOUNTER — Encounter: Payer: Self-pay | Admitting: Primary Care

## 2015-01-26 VITALS — BP 118/66 | HR 84 | Temp 97.9°F | Ht 71.0 in | Wt 207.8 lb

## 2015-01-26 DIAGNOSIS — L02213 Cutaneous abscess of chest wall: Secondary | ICD-10-CM | POA: Diagnosis not present

## 2015-01-26 MED ORDER — CLINDAMYCIN HCL 300 MG PO CAPS: 300.0000 mg | ORAL_CAPSULE | Freq: Three times a day (TID) | ORAL | Status: DC

## 2015-01-26 NOTE — Progress Notes (Signed)
Subjective:    Patient ID: Lilian ComaMitchell Rasool, male    DOB: Oct 06, 1928, 81 y.o.   MRN: [id number]  HPI  Mr. Herbert MoorsSwaim is an 81 year old male who presents today with a chief complaint of abscess to left anterior chest. This has been present for three years and has been following with Dermatology, but has enlarged since Saturday. His wife reports the wound popped open Sunday and she was able to expel puss from site. Since then its continued to drain and the surrounding skin has become red. The wound has not grown in size or worsened since Sunday, despite expulsion, and the patient denies fevers, chills, shortness of breath, pain, or itching to the wound. He has an appointment with Dermatology this Thursday. His wife has not given him anything OTC for the wound.   Review of Systems  Constitutional: Negative for fever and chills.  HENT: Negative for rhinorrhea.   Respiratory: Negative for cough and shortness of breath.   Cardiovascular: Negative for chest pain.  Musculoskeletal: Negative for myalgias.  Skin: Positive for wound.       See HPI  Neurological: Negative for dizziness.       Past Medical History  Diagnosis Date  . Cardiomyopathy   . Hypertension   . Coronary artery disease   . Heart attack   . Hyperlipidemia   . Vitamin B12 deficiency   . Pneumonia   . Thrombocytopenia   . Cor pulmonale   . Hearing loss   . Alcohol abuse, unspecified   . Tobacco use disorder   . Renal insufficiency   . Osteoarthritis   . Gout   . Diverticulosis of colon   . COPD (chronic obstructive pulmonary disease)   . Anemia   . Hypercholesterolemia   . Bronchiectasis     CT Chest 07/07/09 bilateral lower lobe medial bronchiectasis  . CHF (congestive heart failure)     History   Social History  . Marital Status: Married    Spouse Name: N/A  . Number of Children: N/A  . Years of Education: N/A   Occupational History  . Retired    Social History Main Topics  . Smoking status: Former Smoker  -- 1.00 packs/day for 48 years    Quit date: 11/13/1996  . Smokeless tobacco: Not on file  . Alcohol Use: No  . Drug Use: No  . Sexual Activity: Not Currently   Other Topics Concern  . Not on file   Social History Narrative   [place]ives in [place] with [place]      Retired      No regular exercise due to co-morbidities      Cannot walk long distnaces    Past Surgical History  Procedure Laterality Date  . Cardiac catheterization      (catheterization 2006 demonstrated an   occluded LAD.  There is a proximal stent in the RCA which was   patent.  He had distal 75% and 95% RCA lesions.  Initially it was  thought that he would have PCI but he was managed medically).  . Mrils      disk disease 06/1999  . Esophagogastroduodenoscopy      gastritis 12/1999    Family History  Problem Relation Age of Onset  . Lymphoma Brother   . Prostate cancer Brother     Allergies  Allergen Reactions  . Doxazosin Mesylate     rash  . Penicillins     REACTION: unknown reaction    Current  Outpatient Prescriptions on File Prior to Visit  Medication Sig Dispense Refill  . aspirin 325 MG tablet Take 325 mg by mouth daily.      . calcium carbonate (TUMS - DOSED IN MG ELEMENTAL CALCIUM) 500 MG chewable tablet Chew 1 tablet by mouth daily.    . cholecalciferol (VITAMIN D) 1000 UNITS tablet Take 1 tablet (1,000 Units total) by mouth daily.    . clopidogrel (PLAVIX) 75 MG tablet TAKE ONE TABLET BY MOUTH ONCE DAILY 90 tablet 0  . furosemide (LASIX) 80 MG tablet Take 1 tablet (80 mg total) by mouth daily. TAKE 1 TABLET EVERY MORNING AND TAKE 1/2 TABLET IN THE EVENING ON DAYS MONDAY,    WEDNESDAY, AND FRIDAY 144 tablet 3  . isosorbide mononitrate (IMDUR) 30 MG 24 hr tablet TAKE TWO TABLETS BY MOUTH ONCE DAILY 180 tablet 0  . losartan (COZAAR) 50 MG tablet Take 0.5 tablets (25 mg total) by mouth daily. 45 tablet 3  . metoprolol succinate (TOPROL-XL) 100 MG 24 hr tablet TAKE 1 TABLET BY MOUTH EVERY DAY  WITH A MEAL 90 tablet 1  . nitroGLYCERIN (NITROSTAT) 0.4 MG SL tablet Place 1 tablet (0.4 mg total) under the tongue every 5 (five) minutes as needed for chest pain. 25 tablet 2  . NON FORMULARY Oxygen- 2 liters every 24 hours     . simvastatin (ZOCOR) 20 MG tablet TAKE ONE TABLET BY MOUTH ONCE DAILY AT BEDTIME 90 tablet 0  . SPIRIVA HANDIHALER 18 MCG inhalation capsule INHALE ONE CAPSULE BY MOUTH ONCE DAILY 90 capsule 1  . tamsulosin (FLOMAX) 0.4 MG CAPS capsule Take 1 capsule (0.4 mg total) by mouth daily. 30 capsule 11  . vitamin B-12 (CYANOCOBALAMIN) 1000 MCG tablet Take 1,000 mcg by mouth daily.     No current facility-administered medications on file prior to visit.    BP 118/66 mmHg  Pulse 84  Temp(Src) 97.9 F (36.6 C) (Oral)  Ht  (1.803 m)  Wt 207 lb 12.8 oz (94.257 kg)  BMI 28.99 kg/m2  SpO2 97%    Objective:   Physical Exam  Constitutional: He is oriented to person, place, and time. He appears well-developed.  HENT:  Head: Normocephalic.  Neck: Neck supple.  Cardiovascular: Normal rate and normal heart sounds.   Pulmonary/Chest: Effort normal and breath sounds normal.  Lymphadenopathy:    He has no cervical adenopathy.  Neurological: He is alert and oriented to person, place, and time.  Skin: Skin is warm and dry. No rash noted. There is erythema.     2.5cm fluctuant wound present. Whitish/green drainage and foul odor present.  Psychiatric: He has a normal mood and affect.          Assessment & Plan:

## 2015-01-26 NOTE — Patient Instructions (Addendum)
Start Clindamycin tablets three times daily x 10 days. Keep appointment with Dermatologist. Keep wound clean, and use warm compresses to help expel any remaining drainage. I hope you feel better soon!

## 2015-01-26 NOTE — Assessment & Plan Note (Signed)
Suspect this may be MRSA or Staph. Culture sent for further evaluation. Wound already opened slightly, expelled moderate amount of whitish/green discharge with foul odor. Clindamycin 300mg  given in case MRSA. Patient to follow up with Dermatology Thursday this week.

## 2015-01-26 NOTE — Progress Notes (Signed)
Pre visit review using our clinic review tool, if applicable. No additional management support is needed unless otherwise documented below in the visit note. 

## 2015-01-26 NOTE — Addendum Note (Signed)
Addended by: Tawnya CrookSAMBATH, Lyden Redner on: 01/26/2015 10:29 AM   Modules accepted: Orders

## 2015-01-29 LAB — WOUND CULTURE
Gram Stain: NONE SEEN
Organism ID, Bacteria: NO GROWTH

## 2015-02-08 ENCOUNTER — Other Ambulatory Visit: Payer: Self-pay

## 2015-02-08 MED ORDER — TIOTROPIUM BROMIDE MONOHYDRATE 18 MCG IN CAPS: ORAL_CAPSULE | RESPIRATORY_TRACT | Status: DC

## 2015-02-08 NOTE — Telephone Encounter (Signed)
Mrs Bill Walker request status of refill for spiriva.advised to ck with pharmacy, refill already done.

## 2015-02-19 ENCOUNTER — Other Ambulatory Visit: Payer: Self-pay

## 2015-02-19 MED ORDER — FUROSEMIDE 80 MG PO TABS: 80.0000 mg | ORAL_TABLET | Freq: Every day | ORAL | Status: DC

## 2015-03-11 ENCOUNTER — Other Ambulatory Visit: Payer: Self-pay | Admitting: *Deleted

## 2015-03-11 MED ORDER — ISOSORBIDE MONONITRATE ER 30 MG PO TB24: 60.0000 mg | ORAL_TABLET | Freq: Every day | ORAL | Status: DC

## 2015-03-11 NOTE — Telephone Encounter (Signed)
Please schedule late summer f/u and refill until then  

## 2015-03-11 NOTE — Telephone Encounter (Signed)
appt scheduled and med refilled 

## 2015-03-11 NOTE — Telephone Encounter (Signed)
Electronic refill request, last OV 05/12/14 and no future appt., please advise

## 2015-03-27 ENCOUNTER — Other Ambulatory Visit: Payer: Self-pay | Admitting: Family Medicine

## 2015-04-01 NOTE — Progress Notes (Signed)
Patient ID: Bill Walker, male   DOB: 04/16/1928, 81 y.o.   MRN: [id number] Bill Walker is seen today for F/U of CAD. In hospital 02/2011 and 03/2011. He was cathed by Dr Riley KillStuckey 03/24/2011  and had 3VD. I reviewed his cath films and agree that his lesions are not amenable to PCI and he is not a CABG candidate due to his age and advanced oxygen dependant COPD. In hospital 4/12 hospital with respitory failure and pneumonia 2/12 Hospice involved with care Since D/C he has not had SSCP His activity is limited by dypnea. The heat in particular bothers him. He has been compliant with his meds and had is sl nitro with him today. He has had some increasing LE edema. His wife has taken the salt shaker away. He has not needed any nitro. Chronic thrombocytopenia sees Heme/Onc and gets B12 shorts for anemia Needs a refill on nitro Had walk test to recertify for oxgyen and actually did not desat that much.   ARB and lasix doses lowered due to azotemia  Has lost a lot of weight lately Hearing is very poor Wife indicates he is doing well and sats over 90% with his oxygen  ROS: Denies fever, malais, weight loss, blurry vision, decreased visual acuity, cough, sputum, SOB, hemoptysis, pleuritic pain, palpitaitons, heartburn, abdominal pain, melena, lower extremity edema, claudication, or rash.  All other systems reviewed and negative  General: Affect appropriate Frail elderly male with oxygen HEENT: normal Neck supple with no adenopathy JVP normal no bruits no thyromegaly Lungs clear with no wheezing and good diaphragmatic motion Heart:  S1/S2 no murmur, no rub, gallop or click PMI normal Abdomen: benighn, BS positve, no tenderness, no AAA no bruit.  No HSM or HJR Distal pulses intact with no bruits Mild bilateral edema Neuro non-focal RUE tremor  Skin warm and dry No muscular weakness   Current Outpatient Prescriptions  Medication Sig Dispense Refill  . aspirin 325 MG tablet Take 325 mg by mouth daily.       . calcium carbonate (TUMS - DOSED IN MG ELEMENTAL CALCIUM) 500 MG chewable tablet Chew 1 tablet by mouth daily.    . cholecalciferol (VITAMIN D) 1000 UNITS tablet Take 1 tablet (1,000 Units total) by mouth daily.    . clopidogrel (PLAVIX) 75 MG tablet TAKE ONE TABLET BY MOUTH ONCE DAILY 90 tablet 0  . furosemide (LASIX) 80 MG tablet Take 1 tablet (80 mg total) by mouth daily. TAKE 1 TABLET EVERY MORNING AND TAKE 1/2 TABLET IN THE EVENING ON DAYS MONDAY,    WEDNESDAY, AND FRIDAY 144 tablet 3  . isosorbide mononitrate (IMDUR) 30 MG 24 hr tablet Take 2 tablets (60 mg total) by mouth daily. 180 tablet 0  . losartan (COZAAR) 50 MG tablet Take 0.5 tablets (25 mg total) by mouth daily. 45 tablet 3  . metoprolol succinate (TOPROL-XL) 100 MG 24 hr tablet TAKE 1 TABLET BY MOUTH EVERY DAY WITH A MEAL 90 tablet 1  . nitroGLYCERIN (NITROSTAT) 0.4 MG SL tablet Place 1 tablet (0.4 mg total) under the tongue every 5 (five) minutes as needed for chest pain. 25 tablet 2  . NON FORMULARY Oxygen- 2 liters every 24 hours     . simvastatin (ZOCOR) 20 MG tablet TAKE ONE TABLET BY MOUTH AT BEDTIME 90 tablet 0  . tamsulosin (FLOMAX) 0.4 MG CAPS capsule Take 1 capsule (0.4 mg total) by mouth daily. 30 capsule 11  . tiotropium (SPIRIVA HANDIHALER) 18 MCG inhalation capsule INHALE ONE CAPSULE BY  MOUTH ONCE DAILY 90 capsule 3  . vitamin B-12 (CYANOCOBALAMIN) 1000 MCG tablet Take 1,000 mcg by mouth daily.     No current facility-administered medications for this visit.    Allergies  Doxazosin mesylate and Penicillins  Electrocardiogram:  11/14  SR rate 67  ASMI    10/05/14  SB rate 56  PVC possible old ASMI possible IMI mild change since 11/14   Assessment and Plan CAD:  Stable with no angina and good activity level.  Continue medical Rx COPD:  Stable sats ok continue oxgyen f/u pulmonary Chol:   Cholesterol is at goal.  Continue current dose of statin and diet Rx.  No myalgias or side effects.  F/U  LFT's in 6  months. Lab Results  Component Value Date   LDLCALC 35 11/26/2013  HTN:  Well controlled.  Continue current medications and low sodium Dash type diet.    F/U with mie in 6 months

## 2015-04-05 ENCOUNTER — Ambulatory Visit (INDEPENDENT_AMBULATORY_CARE_PROVIDER_SITE_OTHER): Payer: Medicare Other | Admitting: Cardiovascular Disease

## 2015-04-05 ENCOUNTER — Encounter: Payer: Self-pay | Admitting: Cardiovascular Disease

## 2015-04-05 VITALS — BP 110/52 | HR 73 | Ht 71.0 in | Wt 208.4 lb

## 2015-04-05 DIAGNOSIS — I2510 Atherosclerotic heart disease of native coronary artery without angina pectoris: Secondary | ICD-10-CM

## 2015-04-05 DIAGNOSIS — I2583 Coronary atherosclerosis due to lipid rich plaque: Principal | ICD-10-CM

## 2015-04-05 MED ORDER — LOSARTAN POTASSIUM 50 MG PO TABS: 25.0000 mg | ORAL_TABLET | Freq: Every day | ORAL | Status: DC

## 2015-04-05 MED ORDER — NITROGLYCERIN 0.4 MG SL SUBL: 0.4000 mg | SUBLINGUAL_TABLET | SUBLINGUAL | Status: DC | PRN

## 2015-04-05 NOTE — Patient Instructions (Signed)
Medication Instructions:  NO CHANGES  Labwork: NONE  Testing/Procedures: NONE  Follow-Up: Your physician wants you to follow-up in: 6 MONTHS WITH DR NISHAN You will receive a reminder letter in the mail two months in advance. If you don't receive a letter, please call our office to schedule the follow-up appointment.  Any Other Special Instructions Will Be Listed Below (If Applicable).   

## 2015-04-27 ENCOUNTER — Encounter: Payer: Self-pay | Admitting: Family Medicine

## 2015-04-27 ENCOUNTER — Ambulatory Visit (INDEPENDENT_AMBULATORY_CARE_PROVIDER_SITE_OTHER): Payer: Medicare Other | Admitting: Family Medicine

## 2015-04-27 VITALS — BP 136/84 | HR 71 | Temp 97.5°F | Ht 71.0 in | Wt 209.5 lb

## 2015-04-27 DIAGNOSIS — I2510 Atherosclerotic heart disease of native coronary artery without angina pectoris: Secondary | ICD-10-CM

## 2015-04-27 DIAGNOSIS — Z23 Encounter for immunization: Secondary | ICD-10-CM | POA: Diagnosis not present

## 2015-04-27 DIAGNOSIS — E785 Hyperlipidemia, unspecified: Secondary | ICD-10-CM | POA: Diagnosis not present

## 2015-04-27 DIAGNOSIS — N400 Enlarged prostate without lower urinary tract symptoms: Secondary | ICD-10-CM | POA: Diagnosis not present

## 2015-04-27 DIAGNOSIS — I10 Essential (primary) hypertension: Secondary | ICD-10-CM

## 2015-04-27 DIAGNOSIS — N289 Disorder of kidney and ureter, unspecified: Secondary | ICD-10-CM

## 2015-04-27 DIAGNOSIS — E538 Deficiency of other specified B group vitamins: Secondary | ICD-10-CM

## 2015-04-27 DIAGNOSIS — Z8042 Family history of malignant neoplasm of prostate: Secondary | ICD-10-CM

## 2015-04-27 LAB — CBC WITH DIFFERENTIAL/PLATELET
BASOS PCT: 0.2 % (ref 0.0–3.0)
Basophils Absolute: 0 10*3/uL (ref 0.0–0.1)
EOS ABS: 0 10*3/uL (ref 0.0–0.7)
Eosinophils Relative: 0.6 % (ref 0.0–5.0)
HEMATOCRIT: 35 % — AB (ref 39.0–52.0)
HEMOGLOBIN: 11.8 g/dL — AB (ref 13.0–17.0)
LYMPHS ABS: 0.4 10*3/uL — AB (ref 0.7–4.0)
Lymphocytes Relative: 6.8 % — ABNORMAL LOW (ref 12.0–46.0)
MCHC: 33.6 g/dL (ref 30.0–36.0)
MCV: 101 fl — ABNORMAL HIGH (ref 78.0–100.0)
Monocytes Absolute: 0.6 10*3/uL (ref 0.1–1.0)
Monocytes Relative: 8.6 % (ref 3.0–12.0)
NEUTROS ABS: 5.4 10*3/uL (ref 1.4–7.7)
Neutrophils Relative %: 83.8 % — ABNORMAL HIGH (ref 43.0–77.0)
Platelets: 111 10*3/uL — ABNORMAL LOW (ref 150.0–400.0)
RBC: 3.47 Mil/uL — AB (ref 4.22–5.81)
RDW: 14.2 % (ref 11.5–15.5)
WBC: 6.5 10*3/uL (ref 4.0–10.5)

## 2015-04-27 LAB — COMPREHENSIVE METABOLIC PANEL
ALBUMIN: 3.6 g/dL (ref 3.5–5.2)
ALT: 7 U/L (ref 0–53)
AST: 16 U/L (ref 0–37)
Alkaline Phosphatase: 43 U/L (ref 39–117)
BUN: 35 mg/dL — AB (ref 6–23)
CHLORIDE: 90 meq/L — AB (ref 96–112)
CO2: 39 mEq/L — ABNORMAL HIGH (ref 19–32)
Calcium: 9 mg/dL (ref 8.4–10.5)
Creatinine, Ser: 1.4 mg/dL (ref 0.40–1.50)
GFR: 51.14 mL/min — ABNORMAL LOW (ref >60.00)
GLUCOSE: 107 mg/dL — AB (ref 70–99)
POTASSIUM: 4.9 meq/L (ref 3.5–5.1)
Sodium: 130 mEq/L — ABNORMAL LOW (ref 135–145)
Total Bilirubin: 0.8 mg/dL (ref 0.2–1.2)
Total Protein: 5.3 g/dL — ABNORMAL LOW (ref 6.0–8.3)

## 2015-04-27 LAB — LIPID PANEL
CHOL/HDL RATIO: 2
Cholesterol: 126 mg/dL (ref 0–200)
HDL: 62.1 mg/dL (ref >39.00)
LDL Cholesterol: 53 mg/dL (ref 0–99)
NonHDL: 63.9
TRIGLYCERIDES: 57 mg/dL (ref 0.0–149.0)
VLDL: 11.4 mg/dL (ref 0.0–40.0)

## 2015-04-27 LAB — TSH: TSH: 2.17 u[IU]/mL (ref 0.35–4.50)

## 2015-04-27 LAB — PSA: PSA: 0.64 ng/mL (ref 0.10–4.00)

## 2015-04-27 LAB — VITAMIN B12: Vitamin B-12: 1489 pg/mL — ABNORMAL HIGH (ref 211–911)

## 2015-04-27 MED ORDER — TAMSULOSIN HCL 0.4 MG PO CAPS: 0.4000 mg | ORAL_CAPSULE | Freq: Every day | ORAL | Status: DC

## 2015-04-27 NOTE — Patient Instructions (Signed)
prevnar vaccine today  Due also for a tetanus shot - get that [facility]  Labs today  Glad you are doing well

## 2015-04-27 NOTE — Progress Notes (Signed)
Pre visit review using our clinic review tool, if applicable. No additional management support is needed unless otherwise documented below in the visit note. 

## 2015-04-27 NOTE — Progress Notes (Signed)
Subjective:    Patient ID: Bill Walker, male    DOB: 01-17-1928, 81 y.o.   MRN: [id number]  HPI Here for f/u of chronic medical problems   Had a lesion removed from chest by derm-not a cancer (a recurrent cyst)  Overall doing ok  Seen cardiology - pretty stable   Wt is up 1 lb with bmi of 29  bp is stable today -per pt no problems out of office  No cp or palpitations or headaches or edema  No side effects to medicines  BP Readings from Last 3 Encounters:  04/27/15 136/84  04/05/15 110/52  01/26/15 118/66       Renal insuff- last labs  Sees renal  Avoids nsaids and tries to keep up water intake) Lab Results  Component Value Date   CREATININE 1.5 05/12/2014   BUN 37* 05/12/2014   NA 140 05/12/2014   K 5.1 05/12/2014   CL 99 05/12/2014   CO2 35* 05/12/2014    Hx of BPH Also prostate ca in family  Lab Results  Component Value Date   PSA 0.72 03/25/2014   PSA 0.2 02/15/2004    On flomax  Doing much better -no more nocutria  No stream problems   Due for labs   Due for prevnar vaccine   Patient Active Problem List   Diagnosis Date Noted  . Cutaneous abscess of chest wall 01/26/2015  . Hematuria 05/12/2014  . Urinary frequency 03/25/2014  . Family history of prostate cancer 03/25/2014  . BPH (benign prostatic hyperplasia) 03/25/2014  . Chronic respiratory failure 05/14/2013  . PACER/LEAD MECH. COMPLICATION 07/11/2010  . B12 deficiency 07/29/2009  . Elevated lipids 07/29/2009  . MI 07/29/2009  . CARDIOMYOPATHY 07/29/2009  . THROMBOCYTOPENIA 02/19/2009  . GOUT 10/21/2007  . ANEMIA-NOS 10/21/2007  . History of alcohol abuse 10/21/2007  . HEARING LOSS 10/21/2007  . Essential hypertension 10/21/2007  . Coronary atherosclerosis 10/21/2007  . COR PULMONALE 10/21/2007  . 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 DIVERTICULOSIS, COLON 10/21/2007  . Renal insufficiency 10/21/2007  . OSTEOARTHRITIS 10/21/2007   Past Medical History    Diagnosis Date  . Cardiomyopathy   . Hypertension   . Coronary artery disease   . Heart attack   . Hyperlipidemia   . Vitamin B12 deficiency   . Pneumonia   . Thrombocytopenia   . Cor pulmonale   . Hearing loss   . Alcohol abuse, unspecified   . Tobacco use disorder   . Renal insufficiency   . Osteoarthritis   . Gout   . Diverticulosis of colon   . COPD (chronic obstructive pulmonary disease)   . Anemia   . Hypercholesterolemia   . Bronchiectasis     CT Chest 07/07/09 bilateral lower lobe medial bronchiectasis  . CHF (congestive heart failure)    Past Surgical History  Procedure Laterality Date  . Cardiac catheterization      (catheterization 2006 demonstrated an   occluded LAD.  There is a proximal stent in the RCA which was   patent.  He had distal 75% and 95% RCA lesions.  Initially it was  thought that he would have PCI but he was managed medically).  . Mrils      disk disease 06/1999  . Esophagogastroduodenoscopy      gastritis 12/1999   History  Substance Use Topics  . Smoking status: Former Smoker -- 1.00 packs/day for 48 years    Quit date: 11/13/1996  . Smokeless tobacco:  Not on file  . Alcohol Use: No   Family History  Problem Relation Age of Onset  . Lymphoma Brother   . Prostate cancer Brother    Allergies  Allergen Reactions  . Doxazosin Mesylate     rash  . Penicillins     REACTION: unknown reaction   Current Outpatient Prescriptions on File Prior to Visit  Medication Sig Dispense Refill  . aspirin 325 MG tablet Take 325 mg by mouth daily.      . calcium carbonate (TUMS - DOSED IN MG ELEMENTAL CALCIUM) 500 MG chewable tablet Chew 1 tablet by mouth daily.    . cholecalciferol (VITAMIN D) 1000 UNITS tablet Take 1 tablet (1,000 Units total) by mouth daily.    . clopidogrel (PLAVIX) 75 MG tablet TAKE ONE TABLET BY MOUTH ONCE DAILY 90 tablet 0  . furosemide (LASIX) 80 MG tablet Take 1 tablet (80 mg total) by mouth daily. TAKE 1 TABLET EVERY MORNING  AND TAKE 1/2 TABLET IN THE EVENING ON DAYS MONDAY,    WEDNESDAY, AND FRIDAY 144 tablet 3  . isosorbide mononitrate (IMDUR) 30 MG 24 hr tablet Take 2 tablets (60 mg total) by mouth daily. 180 tablet 0  . losartan (COZAAR) 50 MG tablet Take 0.5 tablets (25 mg total) by mouth daily. 45 tablet 3  . metoprolol succinate (TOPROL-XL) 100 MG 24 hr tablet TAKE 1 TABLET BY MOUTH EVERY DAY WITH A MEAL 90 tablet 1  . nitroGLYCERIN (NITROSTAT) 0.4 MG SL tablet Place 1 tablet (0.4 mg total) under the tongue every 5 (five) minutes as needed for chest pain. 25 tablet 2  . NON FORMULARY Oxygen- 2 liters every 24 hours     . simvastatin (ZOCOR) 20 MG tablet TAKE ONE TABLET BY MOUTH AT BEDTIME 90 tablet 0  . tamsulosin (FLOMAX) 0.4 MG CAPS capsule Take 1 capsule (0.4 mg total) by mouth daily. 30 capsule 11  . tiotropium (SPIRIVA HANDIHALER) 18 MCG inhalation capsule INHALE ONE CAPSULE BY MOUTH ONCE DAILY 90 capsule 3  . vitamin B-12 (CYANOCOBALAMIN) 1000 MCG tablet Take 1,000 mcg by mouth daily.     No current facility-administered medications on file prior to visit.     Review of Systems Review of Systems  Constitutional: Negative for fever, appetite change, fatigue and unexpected weight change.  Eyes: Negative for pain and visual disturbance.  ENT pos for hearing loss/using hearing aides  Respiratory: Negative for cough and neg for sob when using 02 (for chronic copd) Cardiovascular: Negative for cp or palpitations    Gastrointestinal: Negative for nausea, diarrhea and constipation.  Genitourinary: Negative for urgency and frequency.  Skin: Negative for pallor or rash   Neurological: Negative for weakness, light-headedness, numbness and headaches.  Hematological: Negative for adenopathy. Does not bruise/bleed easily.  Psychiatric/Behavioral: Negative for dysphoric mood. The patient is not nervous/anxious.         Objective:   Physical Exam  Constitutional: He appears well-developed and  well-nourished. No distress.  overwt and frail appearing elderly male with 02 by [place]  HENT:  Head: Normocephalic and atraumatic.  Mouth/Throat: Oropharynx is clear and moist.  HOH baseline   Eyes: Conjunctivae and EOM are normal. Pupils are equal, round, and reactive to light.  Neck: Normal range of motion. Neck supple. No JVD present. Carotid bruit is not present. No thyromegaly present.  Cardiovascular: Normal rate, regular rhythm, normal heart sounds and intact distal pulses.  Exam reveals no gallop.   Pulmonary/Chest: Effort normal and breath sounds normal.  No respiratory distress. He has no wheezes. He has no rales.  Diffusely distant bs   No crackles  Abdominal: Soft. Bowel sounds are normal. He exhibits no distension, no abdominal bruit and no mass. There is no tenderness.  Musculoskeletal: He exhibits no edema.  Lymphadenopathy:    He has no cervical adenopathy.  Neurological: He is alert. He has normal reflexes.  Skin: Skin is warm and dry. No rash noted.  Rosacea is stable/ face   Scar from cyst removal on chest   Psychiatric: He has a normal mood and affect.  Pleasant   Supportive wife present           Assessment & Plan:   Problem List Items Addressed This Visit    B12 deficiency    Level today  Supplemented orally  No fatigue or new symptoms       BPH (benign prostatic hyperplasia)    Symptoms are much improved with flomax and no side eff No c/o today  PSA today        Relevant Medications   tamsulosin (FLOMAX) 0.4 MG CAPS capsule   Other Relevant Orders   PSA (Completed)   Elevated lipids    Diet controlled Disc goals for lipids and reasons to control them Lab today for lipid profile  Rev low sat fat diet in detail       Relevant Orders   Lipid panel (Completed)   Essential hypertension - Primary    bp in fair control at this time  BP Readings from Last 1 Encounters:  04/27/15 136/84   No changes needed Disc lifstyle change with low  sodium diet and exercise  No edema  Lab today      Relevant Orders   CBC with Differential/Platelet (Completed)   Comprehensive metabolic panel (Completed)   TSH (Completed)   Lipid panel (Completed)   Family history of prostate cancer    PSA with lab today  No new symptoms       Relevant Orders   Vitamin B12 (Completed)   Renal insufficiency    Sees renal/ multiple etiologies Wt and bp are stable  Avoids nephrotoxic meds  Lab today  Continue renal f/u      Relevant Orders   Comprehensive metabolic panel (Completed)    Other Visit Diagnoses    Need for vaccination with 13-polyvalent pneumococcal conjugate vaccine        Relevant Orders    Pneumococcal conjugate vaccine 13-valent (Completed)

## 2015-04-29 NOTE — Assessment & Plan Note (Signed)
PSA with lab today  No new symptoms

## 2015-04-29 NOTE — Assessment & Plan Note (Signed)
Sees renal/ multiple etiologies Wt and bp are stable  Avoids nephrotoxic meds  Lab today  Continue renal f/u

## 2015-04-29 NOTE — Assessment & Plan Note (Addendum)
Symptoms are much improved with flomax and no side eff No c/o today  PSA today

## 2015-04-29 NOTE — Assessment & Plan Note (Signed)
bp in fair control at this time  BP Readings from Last 1 Encounters:  04/27/15 136/84   No changes needed Disc lifstyle change with low sodium diet and exercise  No edema  Lab today

## 2015-04-29 NOTE — Assessment & Plan Note (Signed)
Level today  Supplemented orally  No fatigue or new symptoms

## 2015-04-29 NOTE — Assessment & Plan Note (Signed)
Diet controlled Disc goals for lipids and reasons to control them Lab today for lipid profile  Rev low sat fat diet in detail

## 2015-05-03 ENCOUNTER — Encounter (HOSPITAL_COMMUNITY): Payer: Self-pay | Admitting: *Deleted

## 2015-05-03 ENCOUNTER — Inpatient Hospital Stay (HOSPITAL_COMMUNITY)
Admission: EM | Admit: 2015-05-03 | Discharge: 2015-05-09 | DRG: 281 | Disposition: A | Discharge status: Home / Self Care | Payer: Medicare Other | Attending: Cardiology | Admitting: Cardiology

## 2015-05-03 ENCOUNTER — Emergency Department (HOSPITAL_COMMUNITY): Payer: Medicare Other

## 2015-05-03 DIAGNOSIS — I25110 Atherosclerotic heart disease of native coronary artery with unstable angina pectoris: Secondary | ICD-10-CM

## 2015-05-03 DIAGNOSIS — I255 Ischemic cardiomyopathy: Secondary | ICD-10-CM | POA: Diagnosis not present

## 2015-05-03 DIAGNOSIS — Z7982 Long term (current) use of aspirin: Secondary | ICD-10-CM

## 2015-05-03 DIAGNOSIS — N1411 Adverse effect of diagnostic agents, initial encounter: Secondary | ICD-10-CM

## 2015-05-03 DIAGNOSIS — Z79899 Other long term (current) drug therapy: Secondary | ICD-10-CM

## 2015-05-03 DIAGNOSIS — I2781 Cor pulmonale (chronic): Secondary | ICD-10-CM | POA: Diagnosis present

## 2015-05-03 DIAGNOSIS — I200 Unstable angina: Secondary | ICD-10-CM | POA: Diagnosis not present

## 2015-05-03 DIAGNOSIS — I5022 Chronic systolic (congestive) heart failure: Secondary | ICD-10-CM | POA: Diagnosis not present

## 2015-05-03 DIAGNOSIS — T508X5A Adverse effect of diagnostic agents, initial encounter: Secondary | ICD-10-CM

## 2015-05-03 DIAGNOSIS — Z87891 Personal history of nicotine dependence: Secondary | ICD-10-CM

## 2015-05-03 DIAGNOSIS — I214 Non-ST elevation (NSTEMI) myocardial infarction: Secondary | ICD-10-CM | POA: Diagnosis not present

## 2015-05-03 DIAGNOSIS — I10 Essential (primary) hypertension: Secondary | ICD-10-CM | POA: Diagnosis present

## 2015-05-03 DIAGNOSIS — E785 Hyperlipidemia, unspecified: Secondary | ICD-10-CM | POA: Diagnosis present

## 2015-05-03 DIAGNOSIS — N183 Chronic kidney disease, stage 3 unspecified: Secondary | ICD-10-CM | POA: Diagnosis present

## 2015-05-03 DIAGNOSIS — W19XXXA Unspecified fall, initial encounter: Secondary | ICD-10-CM

## 2015-05-03 DIAGNOSIS — D696 Thrombocytopenia, unspecified: Secondary | ICD-10-CM | POA: Diagnosis present

## 2015-05-03 DIAGNOSIS — Z7902 Long term (current) use of antithrombotics/antiplatelets: Secondary | ICD-10-CM

## 2015-05-03 DIAGNOSIS — N289 Disorder of kidney and ureter, unspecified: Secondary | ICD-10-CM

## 2015-05-03 DIAGNOSIS — E538 Deficiency of other specified B group vitamins: Secondary | ICD-10-CM | POA: Diagnosis present

## 2015-05-03 DIAGNOSIS — I130 Hypertensive heart and chronic kidney disease with heart failure and stage 1 through stage 4 chronic kidney disease, or unspecified chronic kidney disease: Secondary | ICD-10-CM | POA: Diagnosis present

## 2015-05-03 DIAGNOSIS — R0989 Other specified symptoms and signs involving the circulatory and respiratory systems: Secondary | ICD-10-CM

## 2015-05-03 DIAGNOSIS — Z955 Presence of coronary angioplasty implant and graft: Secondary | ICD-10-CM

## 2015-05-03 DIAGNOSIS — N189 Chronic kidney disease, unspecified: Secondary | ICD-10-CM

## 2015-05-03 DIAGNOSIS — R079 Chest pain, unspecified: Secondary | ICD-10-CM

## 2015-05-03 DIAGNOSIS — I2510 Atherosclerotic heart disease of native coronary artery without angina pectoris: Secondary | ICD-10-CM | POA: Diagnosis present

## 2015-05-03 DIAGNOSIS — N141 Nephropathy induced by other drugs, medicaments and biological substances: Secondary | ICD-10-CM

## 2015-05-03 DIAGNOSIS — J449 Chronic obstructive pulmonary disease, unspecified: Secondary | ICD-10-CM | POA: Diagnosis present

## 2015-05-03 HISTORY — DX: Chronic kidney disease, stage 3 (moderate): N18.3

## 2015-05-03 HISTORY — DX: Chronic systolic (congestive) heart failure: I50.22

## 2015-05-03 HISTORY — DX: Chronic kidney disease, stage 3 unspecified: N18.30

## 2015-05-03 HISTORY — DX: Ischemic cardiomyopathy: I25.5

## 2015-05-03 HISTORY — DX: Personal history of pneumonia (recurrent): Z87.01

## 2015-05-03 LAB — BASIC METABOLIC PANEL
ANION GAP: 5 (ref 5–15)
BUN: 30 mg/dL — AB (ref 6–20)
CHLORIDE: 94 mmol/L — AB (ref 101–111)
CO2: 38 mmol/L — ABNORMAL HIGH (ref 22–32)
Calcium: 8.7 mg/dL — ABNORMAL LOW (ref 8.9–10.3)
Creatinine, Ser: 1.43 mg/dL — ABNORMAL HIGH (ref 0.61–1.24)
GFR, EST AFRICAN AMERICAN: 50 mL/min — AB (ref >60)
GFR, EST NON AFRICAN AMERICAN: 43 mL/min — AB (ref >60)
Glucose, Bld: 120 mg/dL — ABNORMAL HIGH (ref 65–99)
Potassium: 4.9 mmol/L (ref 3.5–5.1)
SODIUM: 137 mmol/L (ref 135–145)

## 2015-05-03 LAB — I-STAT TROPONIN, ED: Troponin i, poc: 0 ng/mL (ref 0.00–0.08)

## 2015-05-03 LAB — CBC
HEMATOCRIT: 32.1 % — AB (ref 39.0–52.0)
Hemoglobin: 11 g/dL — ABNORMAL LOW (ref 13.0–17.0)
MCH: 34.5 pg — AB (ref 26.0–34.0)
MCHC: 34.3 g/dL (ref 30.0–36.0)
MCV: 100.6 fL — AB (ref 78.0–100.0)
Platelets: 91 10*3/uL — ABNORMAL LOW (ref 150–400)
RBC: 3.19 MIL/uL — ABNORMAL LOW (ref 4.22–5.81)
RDW: 12.9 % (ref 11.5–15.5)
WBC: 6 10*3/uL (ref 4.0–10.5)

## 2015-05-03 LAB — TROPONIN I: Troponin I: <0.03 ng/mL (ref <0.031)

## 2015-05-03 LAB — BRAIN NATRIURETIC PEPTIDE: B Natriuretic Peptide: 379.7 pg/mL — ABNORMAL HIGH (ref 0.0–100.0)

## 2015-05-03 MED ORDER — FUROSEMIDE 80 MG PO TABS: 80.0000 mg | ORAL_TABLET | Freq: Every day | ORAL | Status: DC

## 2015-05-03 MED ORDER — ZOLPIDEM TARTRATE 5 MG PO TABS
5.0000 mg | ORAL_TABLET | Freq: Every evening | ORAL | Status: DC | PRN
  Administered 2015-05-03 – 2015-05-08 (×4): 5 mg via ORAL
  Filled 2015-05-03 (×4): qty 1

## 2015-05-03 MED ORDER — CLOPIDOGREL BISULFATE 75 MG PO TABS
75.0000 mg | ORAL_TABLET | Freq: Every day | ORAL | Status: DC
  Administered 2015-05-04 – 2015-05-09 (×6): 75 mg via ORAL
  Filled 2015-05-03 (×6): qty 1

## 2015-05-03 MED ORDER — CALCIUM CARBONATE ANTACID 500 MG PO CHEW
1.0000 | CHEWABLE_TABLET | Freq: Every day | ORAL | Status: DC
  Administered 2015-05-05 – 2015-05-09 (×5): 200 mg via ORAL
  Filled 2015-05-03 (×5): qty 1

## 2015-05-03 MED ORDER — TIOTROPIUM BROMIDE MONOHYDRATE 18 MCG IN CAPS
18.0000 ug | ORAL_CAPSULE | Freq: Every day | RESPIRATORY_TRACT | Status: DC
  Administered 2015-05-04 – 2015-05-05 (×2): 18 ug via RESPIRATORY_TRACT
  Filled 2015-05-03 (×3): qty 5

## 2015-05-03 MED ORDER — VITAMIN D 1000 UNITS PO TABS
1000.0000 [IU] | ORAL_TABLET | Freq: Every day | ORAL | Status: DC
  Administered 2015-05-05 – 2015-05-09 (×5): 1000 [IU] via ORAL
  Filled 2015-05-03 (×5): qty 1

## 2015-05-03 MED ORDER — SODIUM CHLORIDE 0.9 % IV SOLN: 250.0000 mL | INTRAVENOUS | Status: DC | PRN

## 2015-05-03 MED ORDER — SODIUM CHLORIDE 0.9 % IJ SOLN
3.0000 mL | Freq: Two times a day (BID) | INTRAMUSCULAR | Status: DC
  Administered 2015-05-03 – 2015-05-09 (×11): 3 mL via INTRAVENOUS

## 2015-05-03 MED ORDER — NITROGLYCERIN 0.4 MG SL SUBL
0.4000 mg | SUBLINGUAL_TABLET | SUBLINGUAL | Status: DC | PRN
  Administered 2015-05-03: 0.4 mg via SUBLINGUAL
  Filled 2015-05-03: qty 1

## 2015-05-03 MED ORDER — LOSARTAN POTASSIUM 25 MG PO TABS
25.0000 mg | ORAL_TABLET | Freq: Every day | ORAL | Status: DC
  Administered 2015-05-04: 25 mg via ORAL
  Filled 2015-05-03: qty 1

## 2015-05-03 MED ORDER — SODIUM CHLORIDE 0.9 % IJ SOLN: 3.0000 mL | INTRAMUSCULAR | Status: DC | PRN

## 2015-05-03 MED ORDER — NITROGLYCERIN 0.4 MG SL SUBL: 0.4000 mg | SUBLINGUAL_TABLET | SUBLINGUAL | Status: DC | PRN

## 2015-05-03 MED ORDER — AMLODIPINE BESYLATE 2.5 MG PO TABS
2.5000 mg | ORAL_TABLET | Freq: Every day | ORAL | Status: DC
Start: 2015-05-03 — End: 2015-05-04
  Administered 2015-05-04: 2.5 mg via ORAL
  Filled 2015-05-03 (×2): qty 1

## 2015-05-03 MED ORDER — DIPHENHYDRAMINE HCL (SLEEP) 25 MG PO TABS: 25.0000 mg | ORAL_TABLET | Freq: Every evening | ORAL | Status: DC | PRN

## 2015-05-03 MED ORDER — ACETAMINOPHEN 325 MG PO TABS
650.0000 mg | ORAL_TABLET | ORAL | Status: DC | PRN
  Administered 2015-05-03: 650 mg via ORAL
  Filled 2015-05-03: qty 2

## 2015-05-03 MED ORDER — FUROSEMIDE 80 MG PO TABS
80.0000 mg | ORAL_TABLET | Freq: Every day | ORAL | Status: DC
  Administered 2015-05-04 – 2015-05-05 (×2): 80 mg via ORAL
  Filled 2015-05-03 (×2): qty 1

## 2015-05-03 MED ORDER — TAMSULOSIN HCL 0.4 MG PO CAPS
0.4000 mg | ORAL_CAPSULE | Freq: Every day | ORAL | Status: DC
  Administered 2015-05-03 – 2015-05-09 (×6): 0.4 mg via ORAL
  Filled 2015-05-03 (×6): qty 1

## 2015-05-03 MED ORDER — REGADENOSON 0.4 MG/5ML IV SOLN
0.4000 mg | Freq: Once | INTRAVENOUS | Status: AC
Start: 2015-05-04 — End: 2015-05-04
  Administered 2015-05-04: 0.4 mg via INTRAVENOUS
  Filled 2015-05-03: qty 5

## 2015-05-03 MED ORDER — FUROSEMIDE 40 MG PO TABS
40.0000 mg | ORAL_TABLET | ORAL | Status: DC
  Administered 2015-05-05: 40 mg via ORAL
  Filled 2015-05-03 (×2): qty 1

## 2015-05-03 MED ORDER — ISOSORBIDE MONONITRATE ER 60 MG PO TB24
60.0000 mg | ORAL_TABLET | Freq: Every day | ORAL | Status: DC
Start: 2015-05-04 — End: 2015-05-09
  Administered 2015-05-04 – 2015-05-09 (×6): 60 mg via ORAL
  Filled 2015-05-03 (×6): qty 1

## 2015-05-03 MED ORDER — METOPROLOL SUCCINATE ER 100 MG PO TB24
100.0000 mg | ORAL_TABLET | Freq: Every day | ORAL | Status: DC
  Administered 2015-05-04: 100 mg via ORAL
  Filled 2015-05-03: qty 1

## 2015-05-03 MED ORDER — SIMVASTATIN 20 MG PO TABS
20.0000 mg | ORAL_TABLET | Freq: Every day | ORAL | Status: DC
  Administered 2015-05-03 – 2015-05-08 (×6): 20 mg via ORAL
  Filled 2015-05-03 (×6): qty 1

## 2015-05-03 MED ORDER — VITAMIN B-12 1000 MCG PO TABS
1000.0000 ug | ORAL_TABLET | Freq: Every day | ORAL | Status: DC
  Administered 2015-05-05 – 2015-05-09 (×5): 1000 ug via ORAL
  Filled 2015-05-03 (×7): qty 1

## 2015-05-03 MED ORDER — ONDANSETRON HCL 4 MG/2ML IJ SOLN: 4.0000 mg | Freq: Four times a day (QID) | INTRAMUSCULAR | Status: DC | PRN

## 2015-05-03 MED ORDER — ASPIRIN EC 81 MG PO TBEC
81.0000 mg | DELAYED_RELEASE_TABLET | Freq: Every day | ORAL | Status: DC
  Administered 2015-05-04: 81 mg via ORAL
  Filled 2015-05-03: qty 1

## 2015-05-03 NOTE — Progress Notes (Signed)
Looking back through the labs, Pt does not have a history of MRSA

## 2015-05-03 NOTE — ED Notes (Signed)
Pt presents via [facility] from home c/o sudden onset of substernal CP radiating to his back.  Pt took 650 ASA and 2 NTG at home with relief of pain. Pt hx of stents 10 years ago and PNA 3-4 months ago.  Pt on home O2-2L for COPD.  Pt a x 4, NAD, pain free on arrival.  BP-128/72 P-60s NSR with PVCS, O2-97% 2L.

## 2015-05-03 NOTE — ED Notes (Signed)
MD at bedside. 

## 2015-05-03 NOTE — H&P (Signed)
Patient ID: Bill Walker MRN: [id number], DOB/AGE: 1928-10-22   Admit date: 05/03/2015   Primary Physician: Roxy Manns, MD Primary Cardiologist: Charlton Haws, MD  Pt. Profile:  81 year old male with PMH significant for 3 vessel CAD, ischemic cardiomyopathy, cor pulmonale, COPD on home O2, thrombocytopenia, and CKD stage III presenting with left-sided chest pain.    Problem List  Past Medical History  Diagnosis Date  . Ischemic cardiomyopathy     a. 02/2005 EF 32% (myoview report).  . Hypertension   . Coronary artery disease     a. s/p prior MI w/ RCA stenting;  b. 03/2011 Cath: LM 20d, LAD 40p/100p, LCX 40p, L->L collats to distal LAD, RCA dominant, patent prox stent, 51m ISR, 50-41m, 50-60d, 60d-->Med Rx.  Marland Kitchen Hyperlipidemia   . Vitamin B12 deficiency   . History of pneumonia   . Thrombocytopenia   . Cor pulmonale   . Hearing loss   . Alcohol abuse, unspecified   . Tobacco use disorder   . CKD (chronic kidney disease), stage III   . Osteoarthritis   . Gout   . Diverticulosis of colon   . COPD (chronic obstructive pulmonary disease)   . Anemia   . Bronchiectasis     CT Chest 07/07/09 bilateral lower lobe medial bronchiectasis  . Chronic systolic CHF (congestive heart failure)     a. 02/2005 EF 32% (myoview report).    Past Surgical History  Procedure Laterality Date  . Cardiac catheterization      (catheterization 2006 demonstrated an   occluded LAD.  There is a proximal stent in the RCA which was   patent.  He had distal 75% and 95% RCA lesions.  Initially it was  thought that he would have PCI but he was managed medically).  . Mrils      disk disease 06/1999  . Esophagogastroduodenoscopy      gastritis 12/1999     Allergies  Allergies  Allergen Reactions  . Doxazosin Mesylate     rash  . Penicillins     REACTION: unknown reaction    HPI  Bill Walker is an 81 year old male with PMH significant per above. He presents today from home after experiencing  20-30 minutes of moderate to severe chest pressure at rest. This pressure was identical to prior anginal episodes. He took one sublingual NTG tablet and 650 mg of ASA and his pain resolved spontaneously a few minutes later. He has not had chest pain since this time (approximately 1 PM today). He also had concurrent diaphoresis, dyspnea, and mild nausea. This is the first time he's had chest pain like this since 2012.   He saw Dr. Eden Emms on 04/05/15 and was stable at that time. His ARB and Lasix were reduced due to azotemia.    Home Medications  Prior to Admission medications   Medication Sig Start Date End Date Taking? Authorizing Provider  aspirin 325 MG tablet Take 325 mg by mouth daily.     Yes Historical Provider, MD  calcium carbonate (TUMS - DOSED IN MG ELEMENTAL CALCIUM) 500 MG chewable tablet Chew 1 tablet by mouth daily.   Yes Historical Provider, MD  cholecalciferol (VITAMIN D) 1000 UNITS tablet Take 1 tablet (1,000 Units total) by mouth daily. 04/01/13  Yes Wendall Stade, MD  clopidogrel (PLAVIX) 75 MG tablet TAKE ONE TABLET BY MOUTH ONCE DAILY 03/29/15  Yes Judy Pimple, MD  diphenhydrAMINE (SOMINEX) 25 MG tablet Take 25 mg by mouth  at bedtime as needed for sleep.   Yes Historical Provider, MD  furosemide (LASIX) 80 MG tablet Take 1 tablet (80 mg total) by mouth daily. TAKE 1 TABLET EVERY MORNING AND TAKE 1/2 TABLET IN THE EVENING ON DAYS MONDAY,    WEDNESDAY, AND FRIDAY 02/19/15  Yes Judy Pimple, MD  isosorbide mononitrate (IMDUR) 30 MG 24 hr tablet Take 2 tablets (60 mg total) by mouth daily. 03/11/15  Yes Judy Pimple, MD  losartan (COZAAR) 50 MG tablet Take 0.5 tablets (25 mg total) by mouth daily. 04/05/15  Yes Wendall Stade, MD  metoprolol succinate (TOPROL-XL) 100 MG 24 hr tablet TAKE 1 TABLET BY MOUTH EVERY DAY WITH A MEAL 12/25/14  Yes Judy Pimple, MD  nitroGLYCERIN (NITROSTAT) 0.4 MG SL tablet Place 1 tablet (0.4 mg total) under the tongue every 5 (five) minutes as needed for  chest pain. 04/05/15  Yes Wendall Stade, MD  NON FORMULARY Oxygen- 2 liters every 24 hours    Yes Historical Provider, MD  simvastatin (ZOCOR) 20 MG tablet TAKE ONE TABLET BY MOUTH AT BEDTIME 03/29/15  Yes Judy Pimple, MD  tamsulosin (FLOMAX) 0.4 MG CAPS capsule Take 1 capsule (0.4 mg total) by mouth daily. 04/27/15  Yes Judy Pimple, MD  tiotropium (SPIRIVA HANDIHALER) 18 MCG inhalation capsule INHALE ONE CAPSULE BY MOUTH ONCE DAILY 02/08/15  Yes Judy Pimple, MD  vitamin B-12 (CYANOCOBALAMIN) 1000 MCG tablet Take 1,000 mcg by mouth daily.   Yes Historical Provider, MD    Family History  Family History  Problem Relation Age of Onset  . Lymphoma Brother   . Prostate cancer Brother     Social History  History   Social History  . Marital Status: Married    Spouse Name: N/A  . Number of Children: N/A  . Years of Education: N/A   Occupational History  . Retired    Social History Main Topics  . Smoking status: Former Smoker -- 1.00 packs/day for 48 years    Quit date: 11/13/1996  . Smokeless tobacco: Not on file  . Alcohol Use: No  . Drug Use: No  . Sexual Activity: Not Currently   Other Topics Concern  . Not on file   Social History Narrative   Lives in [place] with his wife      Retired      No regular exercise due to co-morbidities      Cannot walk long distnaces     Review of Systems General:  No chills, fever, night sweats. Reports gradual weight loss over the last year.  Cardiovascular:  Per HPI.  Dermatological: No rash, lesions/masses Respiratory: No cough.  Urologic: No hematuria, dysuria Abdominal:   No vomiting, diarrhea, bright red blood per rectum, melena, or hematemesis Neurologic:  No visual changes, wkns, changes in mental status. All other systems reviewed and are otherwise negative except as noted above.  Physical Exam  Blood pressure 129/56, pulse 61, temperature 97.5 F (36.4 C), temperature source Oral, resp. rate 16, height   (1.803 m), weight 208 lb (94.348 kg), SpO2 100 %.  General: Pleasant, elderly, NAD Psych: Normal affect. Neuro: Alert and oriented X 3. Moves all extremities spontaneously. Essential tremor.  HEENT: Normal  Neck: Supple without bruits. JVD at 5 cm.  Lungs:  Resp regular and unlabored. Scattered wheezes in upper airways bilaterally. Poor air movement.  Heart: RRR no s3, s4, or murmurs. Abdomen: Soft, non-tender, non-distended, BS + x 4.  Extremities:  No clubbing, cyanosis. Trace pretibial edema bilaterally. DP/PT/Radials 2+ and equal bilaterally.  Labs  Troponin [facility] Test)  Recent Labs  05/03/15 1510  TROPIPOC 0.00    Lab Results  Component Value Date   WBC 6.5 04/27/2015   HGB 11.8* 04/27/2015   HCT 35.0* 04/27/2015   MCV 101.0* 04/27/2015   PLT 111.0* 04/27/2015    Recent Labs Lab 04/27/15 1001 05/03/15 1450  NA 130* 137  K 4.9 4.9  CL 90* 94*  CO2 39* 38*  BUN 35* 30*  CREATININE 1.40 1.43*  CALCIUM 9.0 8.7*  PROT 5.3*  --   BILITOT 0.8  --   ALKPHOS 43  --   ALT 7  --   AST 16  --   GLUCOSE 107* 120*   Lab Results  Component Value Date   CHOL 126 04/27/2015   HDL 62.10 04/27/2015   LDLCALC 53 04/27/2015   TRIG 57.0 04/27/2015     Radiology/Studies  No results found.  ECG  SR, rate 63, lateral Q waves. Similar to ECG from 2015.   ASSESSMENT AND PLAN  1. Chest pain/CAD: - No pain currently - TIMI score 4; medium risk - Plan for nuclear stress test in AM.  - Admit for obs and trend cardiac enzymes q6 hours x2 - Begin [place] heparin unless he rules in, then increase to therapeutic dosing. - Continue ASA 81 mg daily, Plavix 75 mg daily, Lasix 80 mg daily, Imdur 60 mg daily, Toprol XL 100 mg daily, and Zocor 20 mg daily.  - Add amlodipine 2.5 mg daily  2. Chronic systolic CHF: - No echo in our system. LVEF 32% per myoview in 2006.  - Weight is consistent with baseline.  - Continue Lasix 80 mg PO daily - Continue BB, Losartan, and Toprol  XL  3. CKD stage III: - Creatinine 1.43 which is his baseline - Risk of contrast-induced nephropathy is 26.1%  4. COPD/Cor pulmonale: - Continue home O2 - Continue Spiriva and add albuterol nebs q6 hours PRN  5. HTN: - Well controlled on current regimen. - Adding low dose amlodipine for angina.   Signed, Franne Forts, NP-S

## 2015-05-03 NOTE — ED Notes (Signed)
Patient transported to X-ray 

## 2015-05-03 NOTE — ED Provider Notes (Signed)
CSN: [id number]     Arrival date & time 05/03/15  1434 History   First MD Initiated Contact with Patient 05/03/15 1459     Chief Complaint  Patient presents with  . Chest Pain     (Consider location/radiation/quality/duration/timing/severity/associated sxs/prior Treatment) Patient is a 81 y.o. male presenting with chest pain.  Chest Pain Associated symptoms: diaphoresis    Patient developed anterior chest pain described as pressure felt like "heart pain" onset 1 p.m. today pain was nonradiating. Associated symptoms included sweatiness. No shortness of breath no nausea. He treated himself with 2 sublingual nitroglycerin's and 2 adult aspirin tablets as well as his Plavix with relief. He is presently asymptomatic. No other associated symptoms. Pain onset at rest. Nitroglycerin made pain at her. Nothing made pain worse. As had similar pain 3 or 4 years ago Past Medical History  Diagnosis Date  . Cardiomyopathy   . Hypertension   . Coronary artery disease   . Heart attack   . Hyperlipidemia   . Vitamin B12 deficiency   . Pneumonia   . Thrombocytopenia   . Cor pulmonale   . Hearing loss   . Alcohol abuse, unspecified   . Tobacco use disorder   . Renal insufficiency   . Osteoarthritis   . Gout   . Diverticulosis of colon   . COPD (chronic obstructive pulmonary disease)   . Anemia   . Hypercholesterolemia   . Bronchiectasis     CT Chest 07/07/09 bilateral lower lobe medial bronchiectasis  . CHF (congestive heart failure)    Past Surgical History  Procedure Laterality Date  . Cardiac catheterization      (catheterization 2006 demonstrated an   occluded LAD.  There is a proximal stent in the RCA which was   patent.  He had distal 75% and 95% RCA lesions.  Initially it was  thought that he would have PCI but he was managed medically).  . Mrils      disk disease 06/1999  . Esophagogastroduodenoscopy      gastritis 12/1999   Family History  Problem Relation Age of Onset  .  Lymphoma Brother   . Prostate cancer Brother    History  Substance Use Topics  . Smoking status: Former Smoker -- 1.00 packs/day for 48 years    Quit date: 11/13/1996  . Smokeless tobacco: Not on file  . Alcohol Use: No    Review of Systems  Constitutional: Positive for diaphoresis.  HENT: Negative.   Respiratory: Negative.   Cardiovascular: Negative.   Gastrointestinal: Negative.   Musculoskeletal: Negative.   Skin: Negative.   Neurological: Negative.   Psychiatric/Behavioral: Negative.   All other systems reviewed and are negative.     Allergies  Doxazosin mesylate and Penicillins  Home Medications   Prior to Admission medications   Medication Sig Start Date End Date Taking? Authorizing Provider  aspirin 325 MG tablet Take 325 mg by mouth daily.     Yes Historical Provider, MD  calcium carbonate (TUMS - DOSED IN MG ELEMENTAL CALCIUM) 500 MG chewable tablet Chew 1 tablet by mouth daily.   Yes Historical Provider, MD  cholecalciferol (VITAMIN D) 1000 UNITS tablet Take 1 tablet (1,000 Units total) by mouth daily. 04/01/13  Yes Wendall Stade, MD  clopidogrel (PLAVIX) 75 MG tablet TAKE ONE TABLET BY MOUTH ONCE DAILY 03/29/15  Yes Judy Pimple, MD  diphenhydrAMINE (SOMINEX) 25 MG tablet Take 25 mg by mouth at bedtime as needed for sleep.   Yes Historical  Provider, MD  furosemide (LASIX) 80 MG tablet Take 1 tablet (80 mg total) by mouth daily. TAKE 1 TABLET EVERY MORNING AND TAKE 1/2 TABLET IN THE EVENING ON DAYS MONDAY,    WEDNESDAY, AND FRIDAY 02/19/15  Yes Judy Pimple, MD  isosorbide mononitrate (IMDUR) 30 MG 24 hr tablet Take 2 tablets (60 mg total) by mouth daily. 03/11/15  Yes Judy Pimple, MD  losartan (COZAAR) 50 MG tablet Take 0.5 tablets (25 mg total) by mouth daily. 04/05/15  Yes Wendall Stade, MD  metoprolol succinate (TOPROL-XL) 100 MG 24 hr tablet TAKE 1 TABLET BY MOUTH EVERY DAY WITH A MEAL 12/25/14  Yes Judy Pimple, MD  nitroGLYCERIN (NITROSTAT) 0.4 MG SL tablet  Place 1 tablet (0.4 mg total) under the tongue every 5 (five) minutes as needed for chest pain. 04/05/15  Yes Wendall Stade, MD  NON FORMULARY Oxygen- 2 liters every 24 hours    Yes Historical Provider, MD  simvastatin (ZOCOR) 20 MG tablet TAKE ONE TABLET BY MOUTH AT BEDTIME 03/29/15  Yes Judy Pimple, MD  tamsulosin (FLOMAX) 0.4 MG CAPS capsule Take 1 capsule (0.4 mg total) by mouth daily. 04/27/15  Yes Judy Pimple, MD  tiotropium (SPIRIVA HANDIHALER) 18 MCG inhalation capsule INHALE ONE CAPSULE BY MOUTH ONCE DAILY 02/08/15  Yes Judy Pimple, MD  vitamin B-12 (CYANOCOBALAMIN) 1000 MCG tablet Take 1,000 mcg by mouth daily.   Yes Historical Provider, MD   BP 127/59 mmHg  Pulse 63  Temp(Src) 97.5 F (36.4 C) (Oral)  Resp 14  Ht  (1.803 m)  Wt 208 lb (94.348 kg)  BMI 29.02 kg/m2  SpO2 100% Physical Exam  Constitutional: He appears well-developed and well-nourished.  HENT:  Head: Normocephalic and atraumatic.  Eyes: Conjunctivae are normal. Pupils are equal, round, and reactive to light.  Neck: Neck supple. No tracheal deviation present. No thyromegaly present.  Cardiovascular: Normal rate and regular rhythm.   No murmur heard. Pulmonary/Chest: Effort normal and breath sounds normal.  Abdominal: Soft. Bowel sounds are normal. He exhibits no distension. There is no tenderness.  Musculoskeletal: Normal range of motion. He exhibits edema. He exhibits no tenderness.  Trace pretibial pitting edema  Neurological: He is alert. Coordination normal.  Skin: Skin is warm and dry. No rash noted.  Psychiatric: He has a normal mood and affect.  Nursing note and vitals reviewed.   ED Course  Procedures (including critical care time) Labs Review Labs Reviewed  CBC  BASIC METABOLIC PANEL  BRAIN NATRIURETIC PEPTIDE  I-STAT TROPOININ, ED    Imaging Review No results found.   EKG Interpretation   Date/Time:  Monday May 03 2015 14:35:54 EDT Ventricular Rate:  63 PR Interval:   148 QRS Duration: 95 QT Interval:  415 QTC Calculation: 425 R Axis:   87 Text Interpretation:  Sinus rhythm Inferior infarct, old Abnormal lateral  Q waves Probable anteroseptal infarct, old Since last tracing rate slower  Confirmed by Ethelda Chick  MD, Joycelin Radloff [phone]) on 05/03/2015 3:13:01 PM     4:50 PM patient remains asymptomatic. Chest x-ray viewed by me. Results for orders placed or performed during the hospital encounter of 05/03/15  CBC  Result Value Ref Range   WBC 6.0 4.0 - 10.5 K/uL   RBC 3.19 (L) 4.22 - 5.81 MIL/uL   Hemoglobin 11.0 (L) 13.0 - 17.0 g/dL   HCT 60.4 (L) 54.0 - 98.1 %   MCV 100.6 (H) 78.0 - 100.0 fL   MCH  34.5 (H) 26.0 - 34.0 pg   MCHC 34.3 30.0 - 36.0 g/dL   RDW 40.9 81.1 - 91.4 %   Platelets 91 (L) 150 - 400 K/uL  Basic metabolic panel  Result Value Ref Range   Sodium 137 135 - 145 mmol/L   Potassium 4.9 3.5 - 5.1 mmol/L   Chloride 94 (L) 101 - 111 mmol/L   CO2 38 (H) 22 - 32 mmol/L   Glucose, Bld 120 (H) 65 - 99 mg/dL   BUN 30 (H) 6 - 20 mg/dL   Creatinine, Ser 7.82 (H) 0.61 - 1.24 mg/dL   Calcium 8.7 (L) 8.9 - 10.3 mg/dL   GFR calc non Af Amer 43 (L) >60 mL/min   GFR calc Af Amer 50 (L) >60 mL/min   Anion gap 5 5 - 15  BNP (order ONLY if patient complains of dyspnea/SOB AND you have documented it for THIS visit)  Result Value Ref Range   B Natriuretic Peptide 379.7 (H) 0.0 - 100.0 pg/mL  I-stat troponin, ED  (not at [facility])  Result Value Ref Range   Troponin i, poc 0.00 0.00 - 0.08 ng/mL   Comment 3           Dg Chest 2 View  05/03/2015   CLINICAL DATA:  Sudden onset of substernal chest pain radiating to the back  EXAM: CHEST  2 VIEW  COMPARISON:  12/29/2010  FINDINGS: Chronic cardiomegaly.  Aortic and hilar contours are stable.  Small pleural effusions. There is a retrocardiac rounded opacity which correlates with probable rounded atelectasis on chest CT from 2010. No evidence of edema or pneumonia.  IMPRESSION: 1. Cardiomegaly with small  pleural effusions but no pulmonary edema. 2. Left lower lobe scarring, present since at least 2010.   Electronically Signed   By: Marnee Spring M.D.   On: 05/03/2015 15:44    MDM  Patient seen by cardiology service after consultation by me. They arrange for inpatient stay. Diagnosis unstable angina Final diagnoses:  None        Doug Sou, MD 05/03/15 1706

## 2015-05-04 ENCOUNTER — Other Ambulatory Visit (HOSPITAL_COMMUNITY): Payer: Medicare Other

## 2015-05-04 ENCOUNTER — Encounter (HOSPITAL_COMMUNITY)
Admission: EM | Disposition: A | Discharge status: Home / Self Care | Payer: Medicare Other | Source: Home / Self Care | Attending: Cardiology

## 2015-05-04 ENCOUNTER — Encounter (HOSPITAL_COMMUNITY): Payer: Medicare Other

## 2015-05-04 ENCOUNTER — Observation Stay (HOSPITAL_COMMUNITY): Payer: Medicare Other

## 2015-05-04 DIAGNOSIS — E785 Hyperlipidemia, unspecified: Secondary | ICD-10-CM | POA: Diagnosis not present

## 2015-05-04 DIAGNOSIS — I5022 Chronic systolic (congestive) heart failure: Secondary | ICD-10-CM | POA: Diagnosis present

## 2015-05-04 DIAGNOSIS — J449 Chronic obstructive pulmonary disease, unspecified: Secondary | ICD-10-CM

## 2015-05-04 DIAGNOSIS — I200 Unstable angina: Secondary | ICD-10-CM | POA: Diagnosis present

## 2015-05-04 DIAGNOSIS — Z79899 Other long term (current) drug therapy: Secondary | ICD-10-CM | POA: Diagnosis not present

## 2015-05-04 DIAGNOSIS — I2510 Atherosclerotic heart disease of native coronary artery without angina pectoris: Secondary | ICD-10-CM

## 2015-05-04 DIAGNOSIS — I2781 Cor pulmonale (chronic): Secondary | ICD-10-CM | POA: Diagnosis present

## 2015-05-04 DIAGNOSIS — I25110 Atherosclerotic heart disease of native coronary artery with unstable angina pectoris: Secondary | ICD-10-CM | POA: Diagnosis present

## 2015-05-04 DIAGNOSIS — R079 Chest pain, unspecified: Secondary | ICD-10-CM

## 2015-05-04 DIAGNOSIS — D696 Thrombocytopenia, unspecified: Secondary | ICD-10-CM | POA: Diagnosis present

## 2015-05-04 DIAGNOSIS — Z955 Presence of coronary angioplasty implant and graft: Secondary | ICD-10-CM | POA: Diagnosis not present

## 2015-05-04 DIAGNOSIS — N183 Chronic kidney disease, stage 3 (moderate): Secondary | ICD-10-CM | POA: Diagnosis not present

## 2015-05-04 DIAGNOSIS — I255 Ischemic cardiomyopathy: Secondary | ICD-10-CM | POA: Diagnosis present

## 2015-05-04 DIAGNOSIS — Z7902 Long term (current) use of antithrombotics/antiplatelets: Secondary | ICD-10-CM | POA: Diagnosis not present

## 2015-05-04 DIAGNOSIS — I130 Hypertensive heart and chronic kidney disease with heart failure and stage 1 through stage 4 chronic kidney disease, or unspecified chronic kidney disease: Secondary | ICD-10-CM | POA: Diagnosis present

## 2015-05-04 DIAGNOSIS — Z7982 Long term (current) use of aspirin: Secondary | ICD-10-CM | POA: Diagnosis not present

## 2015-05-04 DIAGNOSIS — I214 Non-ST elevation (NSTEMI) myocardial infarction: Secondary | ICD-10-CM | POA: Diagnosis present

## 2015-05-04 DIAGNOSIS — N179 Acute kidney failure, unspecified: Secondary | ICD-10-CM | POA: Diagnosis not present

## 2015-05-04 DIAGNOSIS — E538 Deficiency of other specified B group vitamins: Secondary | ICD-10-CM | POA: Diagnosis present

## 2015-05-04 DIAGNOSIS — Z87891 Personal history of nicotine dependence: Secondary | ICD-10-CM | POA: Diagnosis not present

## 2015-05-04 HISTORY — PX: CARDIAC CATHETERIZATION: SHX172

## 2015-05-04 LAB — CBC
HCT: 33.4 % — ABNORMAL LOW (ref 39.0–52.0)
Hemoglobin: 11.3 g/dL — ABNORMAL LOW (ref 13.0–17.0)
MCH: 33.8 pg (ref 26.0–34.0)
MCHC: 33.8 g/dL (ref 30.0–36.0)
MCV: 100 fL (ref 78.0–100.0)
Platelets: 95 10*3/uL — ABNORMAL LOW (ref 150–400)
RBC: 3.34 MIL/uL — AB (ref 4.22–5.81)
RDW: 13.3 % (ref 11.5–15.5)
WBC: 23.7 10*3/uL — ABNORMAL HIGH (ref 4.0–10.5)

## 2015-05-04 LAB — NM MYOCAR SINGLE W/SPECT
CHL CUP NUCLEAR SSS: 18
LHR: 0.46

## 2015-05-04 LAB — LIPID PANEL
CHOL/HDL RATIO: 1.9 ratio
CHOLESTEROL: 98 mg/dL (ref 0–200)
HDL: 51 mg/dL (ref >40)
LDL Cholesterol: 38 mg/dL (ref 0–99)
Triglycerides: 45 mg/dL (ref <150)
VLDL: 9 mg/dL (ref 0–40)

## 2015-05-04 LAB — BASIC METABOLIC PANEL
Anion gap: 8 (ref 5–15)
BUN: 32 mg/dL — AB (ref 6–20)
CHLORIDE: 92 mmol/L — AB (ref 101–111)
CO2: 35 mmol/L — ABNORMAL HIGH (ref 22–32)
Calcium: 8.6 mg/dL — ABNORMAL LOW (ref 8.9–10.3)
Creatinine, Ser: 1.83 mg/dL — ABNORMAL HIGH (ref 0.61–1.24)
GFR calc non Af Amer: 32 mL/min — ABNORMAL LOW (ref >60)
GFR, EST AFRICAN AMERICAN: 37 mL/min — AB (ref >60)
Glucose, Bld: 156 mg/dL — ABNORMAL HIGH (ref 65–99)
POTASSIUM: 4.7 mmol/L (ref 3.5–5.1)
SODIUM: 135 mmol/L (ref 135–145)

## 2015-05-04 LAB — TROPONIN I
TROPONIN I: 0.03 ng/mL (ref <0.031)
Troponin I: 0.11 ng/mL — ABNORMAL HIGH (ref <0.031)

## 2015-05-04 LAB — PROTIME-INR
INR: 1.31 (ref 0.00–1.49)
Prothrombin Time: 16.4 seconds — ABNORMAL HIGH (ref 11.6–15.2)

## 2015-05-04 SURGERY — LEFT HEART CATH AND CORONARY ANGIOGRAPHY

## 2015-05-04 MED ORDER — SODIUM CHLORIDE 0.9 % IV SOLN: INTRAVENOUS | Status: DC

## 2015-05-04 MED ORDER — FENTANYL CITRATE (PF) 100 MCG/2ML IJ SOLN
INTRAMUSCULAR | Status: AC
  Filled 2015-05-04: qty 2

## 2015-05-04 MED ORDER — MIDAZOLAM HCL 2 MG/2ML IJ SOLN
INTRAMUSCULAR | Status: DC | PRN
  Administered 2015-05-04: 1 mg via INTRAVENOUS

## 2015-05-04 MED ORDER — LIDOCAINE HCL (PF) 1 % IJ SOLN
INTRAMUSCULAR | Status: AC
  Filled 2015-05-04: qty 30

## 2015-05-04 MED ORDER — SODIUM CHLORIDE 0.9 % IJ SOLN: 3.0000 mL | INTRAMUSCULAR | Status: DC | PRN

## 2015-05-04 MED ORDER — FENTANYL CITRATE (PF) 100 MCG/2ML IJ SOLN
INTRAMUSCULAR | Status: DC | PRN
  Administered 2015-05-04: 25 ug via INTRAVENOUS

## 2015-05-04 MED ORDER — HEPARIN (PORCINE) IN NACL 2-0.9 UNIT/ML-% IJ SOLN
INTRAMUSCULAR | Status: AC
  Filled 2015-05-04: qty 1500

## 2015-05-04 MED ORDER — ONDANSETRON HCL 4 MG/2ML IJ SOLN: 4.0000 mg | Freq: Four times a day (QID) | INTRAMUSCULAR | Status: DC | PRN

## 2015-05-04 MED ORDER — LIDOCAINE HCL (PF) 1 % IJ SOLN
INTRAMUSCULAR | Status: DC | PRN
  Administered 2015-05-04: 30 mL via SUBCUTANEOUS

## 2015-05-04 MED ORDER — IOHEXOL 350 MG/ML SOLN
INTRAVENOUS | Status: DC | PRN
  Administered 2015-05-04: 100 mL via INTRAVENOUS

## 2015-05-04 MED ORDER — SODIUM CHLORIDE 0.9 % IJ SOLN
3.0000 mL | Freq: Two times a day (BID) | INTRAMUSCULAR | Status: DC
Start: 2015-05-04 — End: 2015-05-08
  Administered 2015-05-05 – 2015-05-08 (×5): 3 mL via INTRAVENOUS

## 2015-05-04 MED ORDER — ASPIRIN 81 MG PO CHEW
81.0000 mg | CHEWABLE_TABLET | Freq: Every day | ORAL | Status: DC
  Administered 2015-05-05 – 2015-05-09 (×5): 81 mg via ORAL
  Filled 2015-05-04 (×5): qty 1

## 2015-05-04 MED ORDER — TECHNETIUM TC 99M SESTAMIBI GENERIC - CARDIOLITE
10.0000 | Freq: Once | INTRAVENOUS | Status: AC | PRN
Start: 2015-05-04 — End: 2015-05-04
  Administered 2015-05-04: 10 via INTRAVENOUS

## 2015-05-04 MED ORDER — SODIUM CHLORIDE 0.9 % IV SOLN: 250.0000 mL | INTRAVENOUS | Status: DC | PRN

## 2015-05-04 MED ORDER — REGADENOSON 0.4 MG/5ML IV SOLN
INTRAVENOUS | Status: AC
  Administered 2015-05-04: 0.4 mg via INTRAVENOUS
  Filled 2015-05-04: qty 5

## 2015-05-04 MED ORDER — MIDAZOLAM HCL 2 MG/2ML IJ SOLN
INTRAMUSCULAR | Status: AC
  Filled 2015-05-04: qty 2

## 2015-05-04 MED ORDER — SODIUM CHLORIDE 0.9 % IJ SOLN: 3.0000 mL | Freq: Two times a day (BID) | INTRAMUSCULAR | Status: DC

## 2015-05-04 MED ORDER — AMLODIPINE BESYLATE 5 MG PO TABS
5.0000 mg | ORAL_TABLET | Freq: Every day | ORAL | Status: DC
  Administered 2015-05-05 – 2015-05-09 (×5): 5 mg via ORAL
  Filled 2015-05-04 (×5): qty 1

## 2015-05-04 MED ORDER — RANOLAZINE ER 500 MG PO TB12
500.0000 mg | ORAL_TABLET | Freq: Two times a day (BID) | ORAL | Status: DC
  Administered 2015-05-04 – 2015-05-09 (×10): 500 mg via ORAL
  Filled 2015-05-04 (×10): qty 1

## 2015-05-04 MED ORDER — SODIUM CHLORIDE 0.9 % IV SOLN
INTRAVENOUS | Status: AC
Start: 2015-05-04 — End: 2015-05-04
  Administered 2015-05-04: 16:00:00 via INTRAVENOUS

## 2015-05-04 MED ORDER — ACETAMINOPHEN 325 MG PO TABS
650.0000 mg | ORAL_TABLET | ORAL | Status: DC | PRN
  Administered 2015-05-04 – 2015-05-07 (×2): 650 mg via ORAL
  Filled 2015-05-04 (×2): qty 2

## 2015-05-04 MED ORDER — HEPARIN SODIUM (PORCINE) 5000 UNIT/ML IJ SOLN
5000.0000 [IU] | Freq: Three times a day (TID) | INTRAMUSCULAR | Status: DC
  Administered 2015-05-05 – 2015-05-09 (×12): 5000 [IU] via SUBCUTANEOUS
  Filled 2015-05-04 (×11): qty 1

## 2015-05-04 SURGICAL SUPPLY — 10 items
CATH INFINITI 5FR JL5 (CATHETERS) ×2 IMPLANT
CATH INFINITI 5FR MULTPACK ANG (CATHETERS) ×2 IMPLANT
GUIDEWIRE ANGLED .035X150CM (WIRE) ×2 IMPLANT
KIT HEART LEFT (KITS) ×3 IMPLANT
PACK CARDIAC CATHETERIZATION (CUSTOM PROCEDURE TRAY) ×3 IMPLANT
SHEATH PINNACLE 5F 10CM (SHEATH) ×2 IMPLANT
SYR MEDRAD MARK V 150ML (SYRINGE) ×3 IMPLANT
TRANSDUCER W/STOPCOCK (MISCELLANEOUS) ×3 IMPLANT
WIRE EMERALD 3MM-J .035X150CM (WIRE) ×2 IMPLANT
WIRE SAFE-T 1.5MM-J .035X260CM (WIRE) ×2 IMPLANT

## 2015-05-04 NOTE — H&P (View-Only) (Signed)
 Patient Name: Bill Walker Date of Encounter: 05/04/2015   Principal Problem:   NSTEMI (non-ST elevated myocardial infarction) Active Problems:   Coronary artery disease   Unstable angina   Ischemic cardiomyopathy   Chronic systolic CHF (congestive heart failure)   CKD (chronic kidney disease), stage III   COPD (chronic obstructive pulmonary disease)   Essential hypertension   Hyperlipidemia   Thrombocytopenia   SUBJECTIVE  Had an episode of nitrate responsive c/p overnight.  Trop elevated to 0.11 this AM.  Initially brought down to nuc med for stress testing but given recurrent pain and trop elevation, test cancelled and cath scheduled.  He is currently c/p free.  CURRENT MEDS . amLODipine  2.5 mg Oral Daily  . aspirin EC  81 mg Oral Daily  . calcium carbonate  1 tablet Oral Daily  . cholecalciferol  1,000 Units Oral Daily  . clopidogrel  75 mg Oral Daily  . furosemide  80 mg Oral Daily   And  . furosemide  40 mg Oral Q M,W,F  . isosorbide mononitrate  60 mg Oral Daily  . losartan  25 mg Oral Daily  . metoprolol succinate  100 mg Oral Daily  . simvastatin  20 mg Oral QHS  . sodium chloride  3 mL Intravenous Q12H  . tamsulosin  0.4 mg Oral Daily  . tiotropium  18 mcg Inhalation Daily  . vitamin B-12  1,000 mcg Oral Daily    OBJECTIVE  Filed Vitals:   05/04/15 0035 05/04/15 0400 05/04/15 0740 05/04/15 0900  BP: 104/46 107/41  107/59  Pulse:  118  100  Temp: 97.6 F (36.4 C) 98.2 F (36.8 C) 98 F (36.7 C)   TempSrc: Oral Oral Oral   Resp: 21 22    Height:  5' 11" (1.803 m)    Weight:  211 lb 1.6 oz (95.754 kg)    SpO2: 95% 99%      Intake/Output Summary (Last 24 hours) at 05/04/15 1018 Last data filed at 05/04/15 0130  Gross per 24 hour  Intake    240 ml  Output    575 ml  Net   -335 ml   Filed Weights   05/03/15 1438 05/03/15 1805 05/04/15 0400  Weight: 208 lb (94.348 kg) 207 lb 14.4 oz (94.303 kg) 211 lb 1.6 oz (95.754 kg)    PHYSICAL  EXAM  General: Pleasant, NAD. Neuro: Alert and oriented X 3. Moves all extremities spontaneously. Psych: Normal affect. HEENT: HOH, otw normal  Neck: Supple without bruits or JVD. Lungs:  Resp regular and unlabored, CTA. Heart: RRR no s3, s4, or murmurs. Abdomen: Soft, non-tender, non-distended, BS + x 4.  Extremities: No clubbing, cyanosis or edema. DP/PT/Radials 2+ and equal bilaterally.  Accessory Clinical Findings  CBC  Recent Labs  05/03/15 1450  WBC 6.0  HGB 11.0*  HCT 32.1*  MCV 100.6*  PLT 91*   Basic Metabolic Panel  Recent Labs  05/03/15 1450  NA 137  K 4.9  CL 94*  CO2 38*  GLUCOSE 120*  BUN 30*  CREATININE 1.43*  CALCIUM 8.7*   Cardiac Enzymes  Recent Labs  05/03/15 1850 05/03/15 2357 05/04/15 0537  TROPONINI <0.03 0.03 0.11*   Fasting Lipid Panel  Recent Labs  05/04/15 0537  CHOL 98  HDL 51  LDLCALC 38  TRIG 45  CHOLHDL 1.9   TELE  Seen in nuc med - sinus rhythm.  ECG  ST, 114, inf/ant infarcts, LAD.  Radiology/Studies  Dg Chest   2 View  05/03/2015   CLINICAL DATA:  Sudden onset of substernal chest pain radiating to the back  EXAM: CHEST  2 VIEW  COMPARISON:  12/29/2010  FINDINGS: Chronic cardiomegaly.  Aortic and hilar contours are stable.  Small pleural effusions. There is a retrocardiac rounded opacity which correlates with probable rounded atelectasis on chest CT from 2010. No evidence of edema or pneumonia.  IMPRESSION: 1. Cardiomegaly with small pleural effusions but no pulmonary edema. 2. Left lower lobe scarring, present since at least 2010.   Electronically Signed   By: Jonathon  Watts M.D.   On: 05/03/2015 15:44    ASSESSMENT AND PLAN  1.  NSTEMI/CAD:  Pt with recurrent, nitrate-responsive c/p overnight and mild trop elevation this AM.  Stress testing cancelled and cath scheduled.  The patient understands that risks include but are not limited to stroke (1 in 1000), death (1 in 1000), kidney failure [usually temporary]  (1 in 500), bleeding (1 in 200), allergic reaction [possibly serious] (1 in 200), and agrees to proceed.   Cont heparin, bb, asa, statin, nitrate, and plavix.  2.  Essential HTN:  Stable.  3.  HL:  LDL 38 - cont statin.  4.  ICM:  Last documented EF - 32% by remote nuc study.  F/u echo.  Euvolemic on exam.  Cont bb/arb.  5.  CKD III:  Creat 1.43.  Gently hydrate pre cath and repeat bmet this AM.  6.  Chronic thrombocytopenia:  Plt 91K on admission - on chronic asa/plavix.  F/u given heparinization.  Signed, Christopher Berge NP  

## 2015-05-04 NOTE — Progress Notes (Signed)
Site area: rt groin Site Prior to Removal:  Level  0 Pressure Applied For:  20 minutes Manual: yes   Patient Status During Pull:  stable Post Pull Site:  Level  0 Post Pull Instructions Given:  yes Post Pull Pulses Present: yes Dressing Applied:  tegaderm Bedrest begins @ 1540 Comments:  none

## 2015-05-04 NOTE — Progress Notes (Signed)
Nurse received page from telemetry saying patient's leads were off. Go to patient's room and find him sitting in the floor. Prior to fall patient was resting in bed with his call bell in reach. Patient instructed to use call bell if needed to get out of bed for any reason. Vital signs were stable and patient stated that he hit his head but had no complaint of a headache. Fall risk bandage applied to patient's right arm and socks applied, and bed alarm set. Cardiac fellow notified and aware. Wife also notified about the fall incident. Will continue to monitor.

## 2015-05-04 NOTE — Interval H&P Note (Signed)
Cath Lab Visit (complete for each Cath Lab visit)  Clinical Evaluation Leading to the Procedure:   ACS: Yes.    Non-ACS:    Anginal Classification: CCS III  Anti-ischemic medical therapy: Maximal Therapy (2 or more classes of medications)  Non-Invasive Test Results: No non-invasive testing performed  Prior CABG: No previous CABG      History and Physical Interval Note:  05/04/2015 2:25 PM  Bill Walker  has presented today for surgery, with the diagnosis of cp  The various methods of treatment have been discussed with the patient and family. After consideration of risks, benefits and other options for treatment, the patient has consented to  Procedure(s): Left Heart Cath and Coronary Angiography (N/A) as a surgical intervention .  The patient's history has been reviewed, patient examined, no change in status, stable for surgery.  I have reviewed the patient's chart and labs.  Questions were answered to the patient's satisfaction.     Bill Walker A

## 2015-05-04 NOTE — Progress Notes (Signed)
Patient Name: Bill Walker Date of Encounter: 05/04/2015   Principal Problem:   NSTEMI (non-ST elevated myocardial infarction) Active Problems:   Coronary artery disease   Unstable angina   Ischemic cardiomyopathy   Chronic systolic CHF (congestive heart failure)   CKD (chronic kidney disease), stage III   COPD (chronic obstructive pulmonary disease)   Essential hypertension   Hyperlipidemia   Thrombocytopenia   SUBJECTIVE  Had an episode of nitrate responsive c/p overnight.  Trop elevated to 0.11 this AM.  Initially brought down to nuc med for stress testing but given recurrent pain and trop elevation, test cancelled and cath scheduled.  He is currently c/p free.  CURRENT MEDS . amLODipine  2.5 mg Oral Daily  . aspirin EC  81 mg Oral Daily  . calcium carbonate  1 tablet Oral Daily  . cholecalciferol  1,000 Units Oral Daily  . clopidogrel  75 mg Oral Daily  . furosemide  80 mg Oral Daily   And  . furosemide  40 mg Oral Q M,W,F  . isosorbide mononitrate  60 mg Oral Daily  . losartan  25 mg Oral Daily  . metoprolol succinate  100 mg Oral Daily  . simvastatin  20 mg Oral QHS  . sodium chloride  3 mL Intravenous Q12H  . tamsulosin  0.4 mg Oral Daily  . tiotropium  18 mcg Inhalation Daily  . vitamin B-12  1,000 mcg Oral Daily    OBJECTIVE  Filed Vitals:   05/04/15 0035 05/04/15 0400 05/04/15 0740 05/04/15 0900  BP: 104/46 107/41  107/59  Pulse:  118  100  Temp: 97.6 F (36.4 C) 98.2 F (36.8 C) 98 F (36.7 C)   TempSrc: Oral Oral Oral   Resp: 21 22    Height:   (1.803 m)    Weight:  211 lb 1.6 oz (95.754 kg)    SpO2: 95% 99%      Intake/Output Summary (Last 24 hours) at 05/04/15 1018 Last data filed at 05/04/15 0130  Gross per 24 hour  Intake    240 ml  Output    575 ml  Net   -335 ml   Filed Weights   05/03/15 1438 05/03/15 1805 05/04/15 0400  Weight: 208 lb (94.348 kg) 207 lb 14.4 oz (94.303 kg) 211 lb 1.6 oz (95.754 kg)    PHYSICAL  EXAM  General: Pleasant, NAD. Neuro: Alert and oriented X 3. Moves all extremities spontaneously. Psych: Normal affect. HEENT: HOH, otw normal  Neck: Supple without bruits or JVD. Lungs:  Resp regular and unlabored, CTA. Heart: RRR no s3, s4, or murmurs. Abdomen: Soft, non-tender, non-distended, BS + x 4.  Extremities: No clubbing, cyanosis or edema. DP/PT/Radials 2+ and equal bilaterally.  Accessory Clinical Findings  CBC  Recent Labs  05/03/15 1450  WBC 6.0  HGB 11.0*  HCT 32.1*  MCV 100.6*  PLT 91*   Basic Metabolic Panel  Recent Labs  05/03/15 1450  NA 137  K 4.9  CL 94*  CO2 38*  GLUCOSE 120*  BUN 30*  CREATININE 1.43*  CALCIUM 8.7*   Cardiac Enzymes  Recent Labs  05/03/15 1850 05/03/15 2357 05/04/15 0537  TROPONINI <0.03 0.03 0.11*   Fasting Lipid Panel  Recent Labs  05/04/15 0537  CHOL 98  HDL 51  LDLCALC 38  TRIG 45  CHOLHDL 1.9   TELE  Seen in nuc med - sinus rhythm.  ECG  ST, 114, inf/ant infarcts, LAD.  Radiology/Studies  Dg Chest  2 View  05/03/2015   CLINICAL DATA:  Sudden onset of substernal chest pain radiating to the back  EXAM: CHEST  2 VIEW  COMPARISON:  12/29/2010  FINDINGS: Chronic cardiomegaly.  Aortic and hilar contours are stable.  Small pleural effusions. There is a retrocardiac rounded opacity which correlates with probable rounded atelectasis on chest CT from 2010. No evidence of edema or pneumonia.  IMPRESSION: 1. Cardiomegaly with small pleural effusions but no pulmonary edema. 2. Left lower lobe scarring, present since at least 2010.   Electronically Signed   By: Marnee Spring M.D.   On: 05/03/2015 15:44    ASSESSMENT AND PLAN  1.  NSTEMI/CAD:  Pt with recurrent, nitrate-responsive c/p overnight and mild trop elevation this AM.  Stress testing cancelled and cath scheduled.  The patient understands that risks include but are not limited to stroke (1 in 1000), death (1 in 1000), kidney failure [usually temporary]  (1 in 500), bleeding (1 in 200), allergic reaction [possibly serious] (1 in 200), and agrees to proceed.   Cont heparin, bb, asa, statin, nitrate, and plavix.  2.  Essential HTN:  Stable.  3.  HL:  LDL 38 - cont statin.  4.  ICM:  Last documented EF - 32% by remote nuc study.  F/u echo.  Euvolemic on exam.  Cont bb/arb.  5.  CKD III:  Creat 1.43.  Gently hydrate pre cath and repeat bmet this AM.  6.  Chronic thrombocytopenia:  Plt 91K on admission - on chronic asa/plavix.  F/u given heparinization.  Signed, Nicolasa Ducking NP

## 2015-05-05 ENCOUNTER — Inpatient Hospital Stay (HOSPITAL_COMMUNITY): Payer: Medicare Other

## 2015-05-05 ENCOUNTER — Encounter (HOSPITAL_COMMUNITY): Payer: Self-pay | Admitting: Cardiovascular Disease

## 2015-05-05 DIAGNOSIS — I214 Non-ST elevation (NSTEMI) myocardial infarction: Principal | ICD-10-CM

## 2015-05-05 LAB — CBC
HEMATOCRIT: 31.3 % — AB (ref 39.0–52.0)
Hemoglobin: 10.6 g/dL — ABNORMAL LOW (ref 13.0–17.0)
MCH: 34 pg (ref 26.0–34.0)
MCHC: 33.9 g/dL (ref 30.0–36.0)
MCV: 100.3 fL — ABNORMAL HIGH (ref 78.0–100.0)
PLATELETS: 93 10*3/uL — AB (ref 150–400)
RBC: 3.12 MIL/uL — ABNORMAL LOW (ref 4.22–5.81)
RDW: 13.6 % (ref 11.5–15.5)
WBC: 13.4 10*3/uL — ABNORMAL HIGH (ref 4.0–10.5)

## 2015-05-05 LAB — BASIC METABOLIC PANEL
ANION GAP: 8 (ref 5–15)
ANION GAP: 9 (ref 5–15)
BUN: 37 mg/dL — ABNORMAL HIGH (ref 6–20)
BUN: 38 mg/dL — ABNORMAL HIGH (ref 6–20)
CALCIUM: 8.4 mg/dL — AB (ref 8.9–10.3)
CHLORIDE: 91 mmol/L — AB (ref 101–111)
CO2: 33 mmol/L — AB (ref 22–32)
CO2: 34 mmol/L — ABNORMAL HIGH (ref 22–32)
Calcium: 8 mg/dL — ABNORMAL LOW (ref 8.9–10.3)
Chloride: 92 mmol/L — ABNORMAL LOW (ref 101–111)
Creatinine, Ser: 1.6 mg/dL — ABNORMAL HIGH (ref 0.61–1.24)
Creatinine, Ser: 1.71 mg/dL — ABNORMAL HIGH (ref 0.61–1.24)
GFR calc Af Amer: 40 mL/min — ABNORMAL LOW (ref >60)
GFR calc non Af Amer: 38 mL/min — ABNORMAL LOW (ref >60)
GFR, EST AFRICAN AMERICAN: 44 mL/min — AB (ref >60)
GFR, EST NON AFRICAN AMERICAN: 35 mL/min — AB (ref >60)
GLUCOSE: 94 mg/dL (ref 65–99)
Glucose, Bld: 115 mg/dL — ABNORMAL HIGH (ref 65–99)
Potassium: 5.5 mmol/L — ABNORMAL HIGH (ref 3.5–5.1)
Potassium: 4.4 mmol/L (ref 3.5–5.1)
SODIUM: 133 mmol/L — AB (ref 135–145)
Sodium: 134 mmol/L — ABNORMAL LOW (ref 135–145)

## 2015-05-05 MED ORDER — METOPROLOL SUCCINATE ER 25 MG PO TB24
125.0000 mg | ORAL_TABLET | Freq: Every day | ORAL | Status: DC
  Administered 2015-05-05 – 2015-05-09 (×5): 125 mg via ORAL
  Filled 2015-05-05 (×10): qty 1

## 2015-05-05 MED FILL — Heparin Sodium (Porcine) 2 Unit/ML in Sodium Chloride 0.9%: INTRAMUSCULAR | Qty: 1500 | Status: AC

## 2015-05-05 NOTE — Progress Notes (Addendum)
Patient Name: Bill Walker Date of Encounter: 05/05/2015  Principal Problem:  NSTEMI (non-ST elevated myocardial infarction) Active Problems:  Coronary artery disease  Unstable angina  Ischemic cardiomyopathy  Chronic systolic CHF (congestive heart failure)  CKD (chronic kidney disease), stage III  COPD (chronic obstructive pulmonary disease)  Essential hypertension  Hyperlipidemia  Thrombocytopenia  SUBJECTIVE  No further chest pain. SOB at times. Pt had had a fall last night.   CURRENT MEDS . amLODipine  5 mg Oral Daily  . aspirin  81 mg Oral Daily  . calcium carbonate  1 tablet Oral Daily  . cholecalciferol  1,000 Units Oral Daily  . clopidogrel  75 mg Oral Daily  . furosemide  80 mg Oral Daily   And  . furosemide  40 mg Oral Q M,W,F  . heparin  5,000 Units Subcutaneous 3 times per day  . isosorbide mononitrate  60 mg Oral Daily  . metoprolol succinate  100 mg Oral Daily  . ranolazine  500 mg Oral BID  . simvastatin  20 mg Oral QHS  . sodium chloride  3 mL Intravenous Q12H  . sodium chloride  3 mL Intravenous Q12H  . tamsulosin  0.4 mg Oral Daily  . tiotropium  18 mcg Inhalation Daily  . vitamin B-12  1,000 mcg Oral Daily    OBJECTIVE  Filed Vitals:   05/05/15 0311 05/05/15 0411 05/05/15 0416 05/05/15 0611  BP: 109/60 109/50 112/59 131/65  Pulse: 85 92 95 107  Temp: 98 F (36.7 C) 98.1 F (36.7 C) 97.5 F (36.4 C) 97.8 F (36.6 C)  TempSrc: Oral Oral Oral Oral  Resp: Height:      Weight:   206 lb 11.2 oz (93.759 kg)   SpO2: 100% 100% 99% 100%    Intake/Output Summary (Last 24 hours) at 05/05/15 0821 Last data filed at 05/05/15 1610  Gross per 24 hour  Intake    120 ml  Output    575 ml  Net   -455 ml   Filed Weights   05/03/15 1805 05/04/15 0400 05/05/15 0416  Weight: 207 lb 14.4 oz (94.303 kg) 211 lb 1.6 oz (95.754 kg) 206 lb 11.2 oz (93.759 kg)    PHYSICAL EXAM  General: Pleasant, NAD. Neuro: Alert and oriented  X 3. Moves all extremities spontaneously. Psych: Normal affect. HEENT:  Normal  Neck: Supple without bruits or JVD. Lungs:  Resp regular and unlabored, CTA. Heart: regular rhythm with tachycardia.No s3, s4, or murmurs. Abdomen: Soft, non-tender, non-distended, BS + x 4.  Extremities: No clubbing, cyanosis or edema. DP/PT/Radials 2+ and equal bilaterally. R femoral cath site without erythema, hematoma or bruit.   Accessory Clinical Findings  CBC  Recent Labs  05/04/15 1135 05/05/15 0322  WBC 23.7* PENDING  HGB 11.3* 10.6*  HCT 33.4* 31.3*  MCV 100.0 100.3*  PLT 95* 93*   Basic Metabolic Panel  Recent Labs  05/04/15 1135 05/05/15 0322  NA 135 133*  K 4.7 5.5*  CL 92* 92*  CO2 35* 33*  GLUCOSE 156* 94  BUN 32* 37*  CREATININE 1.83* 1.60*  CALCIUM 8.6* 8.0*    Recent Labs  05/03/15 1850 05/03/15 2357 05/04/15 0537  TROPONINI <0.03 0.03 0.11*     Recent Labs  05/04/15 0537  CHOL 98  HDL 51  LDLCALC 38  TRIG 45  CHOLHDL 1.9   TELE  Sinus tech at rate of 100s-110s  Radiology/Studies  Dg Chest 2 View  05/03/2015  CLINICAL DATA:  Sudden onset of substernal chest pain radiating to the back  EXAM: CHEST  2 VIEW  COMPARISON:  12/29/2010  FINDINGS: Chronic cardiomegaly.  Aortic and hilar contours are stable.  Small pleural effusions. There is a retrocardiac rounded opacity which correlates with probable rounded atelectasis on chest CT from 2010. No evidence of edema or pneumonia.  IMPRESSION: 1. Cardiomegaly with small pleural effusions but no pulmonary edema. 2. Left lower lobe scarring, present since at least 2010.   Electronically Signed   By: Marnee Spring M.D.   On: 05/03/2015 15:44   Ct Head Wo Contrast  05/05/2015   CLINICAL DATA:  Fall, hit head.  On aspirin and Plavix.  EXAM: CT HEAD WITHOUT CONTRAST  TECHNIQUE: Contiguous axial images were obtained from the base of the skull through the vertex without intravenous contrast.  COMPARISON:  03/10/2005   FINDINGS: Diffuse cerebral atrophy. No acute intracranial abnormality. Specifically, no hemorrhage, hydrocephalus, mass lesion, acute infarction, or significant intracranial injury. No acute calvarial abnormality. Visualized paranasal sinuses and mastoids clear. Orbital soft tissues unremarkable.  IMPRESSION: Atrophy.  No acute intracranial abnormality.   Electronically Signed   By: Charlett Nose M.D.   On: 05/05/2015 07:29   Nm Myocar Single W/spect W/wall Motion And Ef  05/04/2015   There was a large, severe defect at rest as described below.  Stress  images were not done.  The patient was sent to the cath lab.   Cath 05/04/15 Conclusion     Mid RCA lesion, 60% stenosed.  Dist LAD lesion, 100% stenosed.  Ost 2nd Diag lesion, 99% stenosed.  Mid LAD-2 lesion, 99% stenosed.  Mid LAD-1 lesion, 80% stenosed.  LM lesion, 45% stenosed.  Dist RCA lesion, 70% stenosed.  Post Atrio-1 lesion, 70% stenosed.  Post Atrio-2 lesion, 70% stenosed.  1st Mrg lesion, 60% stenosed.  Significant coronary calcification with three-vessel coronary obstructive disease.  There is 40-50% eccentric distal left main stenosis; 80% proximal LAD stenosis followed by a 99% bifurcation stenosis involving the takeoff of the first diagonal and LAD with total occlusion of the mid LAD which is old. There are collaterals to the distal LAD from the circumflex marginal branch; 60% stenosis in the OM1 branch of the left circumflex coronary artery; patent stented will RCA with 60% eccentric mid RCA stenosis followed by tandem 70, 70 and 70% distal RCA stenoses beyond the acute margin, after the PDA takeoff, and in the PLA vessel with the distal LAD via the distal RCA.  RECOMMENDATION:  Angiograms will be reviewed with colleagues. The patient has severe LAD disease with collateralization of the distal LAD, both via the distal circumflex marginal branch and distal RCA. The LAD stenoses are not significantly changed from  2011. The RCA stenoses have significantly progressed since 2011. Increased medical therapy will be initiated with addition of low-dose ranolazine added to his regimen. Consider further evaluation for probable aortic aneurysm. He has been felt not to be an operative candidate in the past, but I will defer this to his primary cardiologist. Hydrate post cath with his creatinine clearance of 40.     Coronary Findings    Dominance: Right   Left Main   . LM lesion, 45% stenosed.     Left Anterior Descending  Dist LAD filled by collaterals from 2nd Mrg.   . Mid LAD-1 lesion, 80% stenosed.   . Mid LAD-2 lesion, 99% stenosed. located at the major branch .   Marland Kitchen Dist LAD lesion, 100% stenosed.  and has left-to-left collateral flow.   . Second Diagonal Branch   . Ost 2nd Diag lesion, 99% stenosed. located at the major branch .     Left Circumflex   . First Obtuse Marginal Branch   . 1st Mrg lesion, 60% stenosed.     Right Coronary Artery   . Prox RCA lesion, 0% stenosed. Previously placed Prox RCA stent (unknown type) is patent.   . Mid RCA lesion, 60% stenosed.   . Dist RCA lesion, 70% stenosed. discrete .   Marland Kitchen Right Posterior Atrioventricular Branch   . Post Atrio-1 lesion, 70% stenosed.   Marland Kitchen Post Atrio-2 lesion, 70% stenosed.       ASSESSMENT AND PLAN   1. NSTEMI/CAD: -  Cath showed significant coronary calcification with three-vessel coronary obstructive disease; severe LAD disease with collateralization of the distal LAD, both via the distal circumflex marginal branch and distal RCA.The LAD stenoses are not significantly changed from 2011.The RCA stenoses have significantly progressed since 2011 and patent RCA.  - Recommended increased medical therapy with addition of low -dose ranolazine; further evaluation of aortic aneurysm, felt non operative candidate in past.  - No Further chest pain - Continue ASA, Norvasc, Plavix, Imdur, Ranexa, Statin, Toprol Xl - Tele showed sinus  tachy this AM, consider increasing BB.  2. Essential HTN: - Stable  3. HL:  - LDL 38 - cont statin.  4. ICM:  - Last documented EF - 32% by remote nuc study. Euvolemic on exam. Cont bb. Arb discontinued yesterday, restart once creatinine stable.   5. CKD III:  - Creatinine improved post cath to 1.60 on hydration.   6. Chronic thrombocytopenia:  - Plt 91K on admission - on chronic asa/plavix. F/u given heparinization.Today platelets 93.  7. Fall  - Fall last night. CT of head without acute abnormality. Will get PT eval.   Signed, Bhagat,Bhavinkumar PA-C Pager [phone]  I have personally seen and examined the patient independently and agree with the note as outlined by Manson Passey, PA.  He denies any chest pain this am.  Cath results reviewed.  Will discuss with Dr. Tresa Endo whether intervention on RCA needs to be considered or to continue medical therapy.  Also needs evaluation of aorta for anuerysm with CT scan but will make sure renal function is stable post cath before proceeding.  Will plan for tomorrow.  Continue ASA/amlodipine/Plavix/Imdur/BB and ranexa.  Will increase Toprol XL to  daily due to borderline sinus tachycardia.  Patient fell last night tripping over his feet and hit head but head CT neg.  Agree with PT eval.   K+ elevated this am ? Spurious result.  He was on ARB that was stopped yesterday.  Will repeat.  Elevated WBC - normal on admission and then increased to 23.7 and now back to 13.4.   Will monitor.  Check UA  Signed: Armanda Magic, MD [facility] 05/05/2015

## 2015-05-06 LAB — CBC
HEMATOCRIT: 31.5 % — AB (ref 39.0–52.0)
Hemoglobin: 10.7 g/dL — ABNORMAL LOW (ref 13.0–17.0)
MCH: 34 pg (ref 26.0–34.0)
MCHC: 34 g/dL (ref 30.0–36.0)
MCV: 100 fL (ref 78.0–100.0)
Platelets: 68 10*3/uL — ABNORMAL LOW (ref 150–400)
RBC: 3.15 MIL/uL — ABNORMAL LOW (ref 4.22–5.81)
RDW: 13.8 % (ref 11.5–15.5)
WBC: 8.6 10*3/uL (ref 4.0–10.5)

## 2015-05-06 LAB — BASIC METABOLIC PANEL
Anion gap: 8 (ref 5–15)
BUN: 45 mg/dL — AB (ref 6–20)
CALCIUM: 8 mg/dL — AB (ref 8.9–10.3)
CO2: 35 mmol/L — ABNORMAL HIGH (ref 22–32)
CREATININE: 1.96 mg/dL — AB (ref 0.61–1.24)
Chloride: 90 mmol/L — ABNORMAL LOW (ref 101–111)
GFR calc Af Amer: 34 mL/min — ABNORMAL LOW (ref >60)
GFR, EST NON AFRICAN AMERICAN: 29 mL/min — AB (ref >60)
GLUCOSE: 85 mg/dL (ref 65–99)
Potassium: 4.2 mmol/L (ref 3.5–5.1)
Sodium: 133 mmol/L — ABNORMAL LOW (ref 135–145)

## 2015-05-06 NOTE — Progress Notes (Signed)
SUBJECTIVE:  Denies any chest pain or SOB  OBJECTIVE:   Vitals:   Filed Vitals:   05/05/15 2019 05/05/15 2135 05/06/15 0429 05/06/15 0517  BP:  110/55 109/56   Pulse:  85 94   Temp: 97.7 F (36.5 C) 98 F (36.7 C) 98.4 F (36.9 C) 98.3 F (36.8 C)  TempSrc: Oral Oral Oral Oral  Resp:  21 21   Height:      Weight:   205 lb 11 oz (93.3 kg) 205 lb 6.4 oz (93.169 kg)  SpO2:  100% 97%    I&O's:   Intake/Output Summary (Last 24 hours) at 05/06/15 0945 Last data filed at 05/06/15 0900  Gross per 24 hour  Intake    603 ml  Output    325 ml  Net    278 ml   TELEMETRY: Reviewed telemetry pt in NSR:     PHYSICAL EXAM General: Well developed, well nourished, in no acute distress Head: Eyes PERRLA, No xanthomas.   Normal cephalic and atramatic  Lungs:   Clear bilaterally to auscultation and percussion. Heart:   HRRR S1 S2 Pulses are 2+ & equal. Abdomen: Bowel sounds are positive, abdomen soft and non-tender without masses  Extremities:   No clubbing, cyanosis or edema.  DP +1 Neuro: Alert and oriented X 3. Psych:  Good affect, responds appropriately   LABS: Basic Metabolic Panel:  Recent Labs  16/10/96 1129 05/06/15 0417  NA 134* 133*  K 4.4 4.2  CL 91* 90*  CO2 34* 35*  GLUCOSE 115* 85  BUN 38* 45*  CREATININE 1.71* 1.96*  CALCIUM 8.4* 8.0*   Liver Function Tests: No results for input(s): AST, ALT, ALKPHOS, BILITOT, PROT, ALBUMIN in the last 72 hours. No results for input(s): LIPASE, AMYLASE in the last 72 hours. CBC:  Recent Labs  05/04/15 1135 05/05/15 0322  WBC 23.7* 13.4*  HGB 11.3* 10.6*  HCT 33.4* 31.3*  MCV 100.0 100.3*  PLT 95* 93*   Cardiac Enzymes:  Recent Labs  05/03/15 1850 05/03/15 2357 05/04/15 0537  TROPONINI <0.03 0.03 0.11*   BNP: Invalid input(s): POCBNP D-Dimer: No results for input(s): DDIMER in the last 72 hours. Hemoglobin A1C: No results for input(s): HGBA1C in the last 72 hours. Fasting Lipid Panel:  Recent  Labs  05/04/15 0537  CHOL 98  HDL 51  LDLCALC 38  TRIG 45  CHOLHDL 1.9   Thyroid Function Tests: No results for input(s): TSH, T4TOTAL, T3FREE, THYROIDAB in the last 72 hours.  Invalid input(s): FREET3 Anemia Panel: No results for input(s): VITAMINB12, FOLATE, FERRITIN, TIBC, IRON, RETICCTPCT in the last 72 hours. Coag Panel:   Lab Results  Component Value Date   INR 1.31 05/04/2015   INR 1.03 03/25/2010   INR 1.0 07/07/2009    RADIOLOGY: Dg Chest 2 View  05/03/2015   CLINICAL DATA:  Sudden onset of substernal chest pain radiating to the back  EXAM: CHEST  2 VIEW  COMPARISON:  12/29/2010  FINDINGS: Chronic cardiomegaly.  Aortic and hilar contours are stable.  Small pleural effusions. There is a retrocardiac rounded opacity which correlates with probable rounded atelectasis on chest CT from 2010. No evidence of edema or pneumonia.  IMPRESSION: 1. Cardiomegaly with small pleural effusions but no pulmonary edema. 2. Left lower lobe scarring, present since at least 2010.   Electronically Signed   By: Marnee Spring M.D.   On: 05/03/2015 15:44   Ct Head Wo Contrast  05/05/2015   CLINICAL DATA:  Fall, hit head.  On aspirin and Plavix.  EXAM: CT HEAD WITHOUT CONTRAST  TECHNIQUE: Contiguous axial images were obtained from the base of the skull through the vertex without intravenous contrast.  COMPARISON:  03/10/2005  FINDINGS: Diffuse cerebral atrophy. No acute intracranial abnormality. Specifically, no hemorrhage, hydrocephalus, mass lesion, acute infarction, or significant intracranial injury. No acute calvarial abnormality. Visualized paranasal sinuses and mastoids clear. Orbital soft tissues unremarkable.  IMPRESSION: Atrophy.  No acute intracranial abnormality.   Electronically Signed   By: Charlett Nose M.D.   On: 05/05/2015 07:29   Nm Myocar Single W/spect W/wall Motion And Ef  05/04/2015   There was a large, severe defect at rest as described below.  Stress  images were not done.  The  patient was sent to the cath lab.    ASSESSMENT AND PLAN  1. NSTEMI/CAD: - Cath showed significant coronary calcification with three-vessel coronary obstructive disease; severe LAD disease with collateralization of the distal LAD, both via the distal circumflex marginal branch and distal RCA.The LAD stenoses are not significantly changed from 2011.The RCA stenoses have significantly progressed since 2011 and patent RCA.  - Recommended increased medical therapy with addition of low -dose ranolazine; further evaluation of aortic aneurysm, felt non operative candidate in past. Cannot get Chest/ABD CT at present due to increased creatinine.  May need to evaluate as outpt.   - No Further chest pain - Continue ASA, Norvasc, Plavix, Imdur, Ranexa, Statin, Toprol Xl - HR improved after increasing BB - will review films with Dr. Tresa Endo and Dr. Eden Emms  2. Essential HTN: - Stable  3. HL:  - LDL 38 - cont statin.  4. ICM:  - Last documented EF - 32% by remote nuc study. Euvolemic on exam. Cont bb. ARB on hold due to renal insuff  5. CKD III:  - Creatinine bumped  post cath to 1.96 - will hold Lasix today.   6. Chronic thrombocytopenia:  - Plt 91K on admission - on chronic asa/plavix.   7. Fall  - Fall after tripping the other night. CT of head without acute abnormality. PT eval pending  8.  Leukocytosis ? Etiology.  Trending down.  Afebrile.  Chest xray clear.  UA negative for infection.  There were moderate RBC's in urine.    Bill Reichert, MD  05/06/2015  9:45 AM

## 2015-05-06 NOTE — Evaluation (Signed)
Physical Therapy Evaluation Patient Details Name: Bill Walker MRN: [id number] DOB: [birth year] Today's Date: 05/06/2015   History of Present Illness  81 year old male with PMH significant for 3 vessel CAD, ischemic cardiomyopathy, cor pulmonale, COPD on home O2, thrombocytopenia, and CKD stage III presenting with left-sided chest pain.   Clinical Impression  Pt admitted with above diagnosis. Pt currently with functional limitations due to the deficits listed below (see PT Problem List).  Pt will benefit from skilled PT to increase their independence and safety with mobility to allow discharge to the venue listed below.  Pt ambulates with steadier gait pattern with RW and pt and wife verbalized understanding.  Recommend HHPT and RW for home use.     Follow Up Recommendations Home health PT    Equipment Recommendations  Rolling walker with 5" wheels    Recommendations for Other Services       Precautions / Restrictions Precautions Precautions: Fall      Mobility  Bed Mobility               General bed mobility comments: sitting edge of bed upon arrival  Transfers Overall transfer level: Needs assistance   Transfers: Sit to/from Stand Sit to Stand: Supervision         General transfer comment: good use of hands  Ambulation/Gait Ambulation/Gait assistance: Min guard Ambulation Distance (Feet): 100 Feet (120 with Rw) Assistive device: None Gait Pattern/deviations: Trunk flexed;Decreased step length - right;Decreased step length - left;Step-through pattern     General Gait Details: Amb 100' without AD with flexed posture and general unsteadiness but no LOB.  Pt rested then ambulated with RW and with smoother cadence and improved steadiness.  Stairs            Wheelchair Mobility    Modified Rankin (Stroke Patients Only)       Balance Overall balance assessment: Needs assistance;History of Falls   Sitting balance-Leahy Scale: Good        Standing balance-Leahy Scale: Fair Standing balance comment: Pt can ambulate without AD, but steadier with RW                             Pertinent Vitals/Pain Pain Assessment: No/denies pain    Home Living Family/patient expects to be discharged to:: Private residence Living Arrangements: Spouse/significant other Available Help at Discharge: Available 24 hours/day Type of Home: House Home Access: Stairs to enter Entrance Stairs-Rails: Left Entrance Stairs-Number of Steps: 5 Home Layout: One level Home Equipment: Cane - single point;Other (comment);Bedside commode;Walker - standard (lift chair, oxygen)      Prior Function Level of Independence: Independent               Hand Dominance   Dominant Hand: Right    Extremity/Trunk Assessment   Upper Extremity Assessment: Overall WFL for tasks assessed           Lower Extremity Assessment: Generalized weakness;Overall [facility] for tasks assessed      Cervical / Trunk Assessment: Kyphotic  Communication   Communication: HOH  Cognition Arousal/Alertness: Awake/alert Behavior During Therapy: WFL for tasks assessed/performed Overall Cognitive Status: Within Functional Limits for tasks assessed                      General Comments General comments (skin integrity, edema, etc.): Pt's wife educated as well as written note to pt (HOH) to use a RW when he goes home til  stronger    Exercises        Assessment/Plan    PT Assessment Patient needs continued PT services  PT Diagnosis Generalized weakness;Difficulty walking   PT Problem List Decreased strength;Decreased activity tolerance;Decreased balance;Decreased mobility;Decreased knowledge of use of DME  PT Treatment Interventions Gait training;Functional mobility training;Therapeutic activities;Therapeutic exercise;DME instruction;Balance training   PT Goals (Current goals can be found in the Care Plan section) Acute Rehab PT Goals Patient  Stated Goal: Get stronger PT Goal Formulation: With patient/family Time For Goal Achievement: 05/20/15 Potential to Achieve Goals: Good    Frequency Min 3X/week   Barriers to discharge        Co-evaluation               End of Session Equipment Utilized During Treatment: Gait belt;Oxygen Activity Tolerance: Patient tolerated treatment well Patient left: in chair;with family/visitor present;with call bell/phone within reach Nurse Communication: Mobility status         Time: 1227-1253 PT Time Calculation (min) (ACUTE ONLY): 26 min   Charges:   PT Evaluation $Initial PT Evaluation Tier I: 1 Procedure PT Treatments $Gait Training: 8-22 mins   PT G Codes:        Trevino Wyatt LUBECK 05/06/2015, 1:35 PM

## 2015-05-06 NOTE — Progress Notes (Deleted)
Patient Name: Bill Walker Date of Encounter: 05/06/2015  Principal Problem:   NSTEMI (non-ST elevated myocardial infarction) Active Problems:   Essential hypertension   Ischemic cardiomyopathy   Chronic systolic CHF (congestive heart failure)   Coronary artery disease   Hyperlipidemia   Thrombocytopenia   CKD (chronic kidney disease), stage III   COPD (chronic obstructive pulmonary disease)   Unstable angina   COLD (chronic obstructive lung disease)   Primary Cardiologist: Dr Eden Emms  Patient Profile: 3 vessel CAD, ischemic cardiomyopathy, cor pulmonale, COPD on home O2, thrombocytopenia, and CKD stage III, admitted 06/20 w/ chest pain. Cath 06/22 w/ 3 v dz, worse than 2011. Med rx for now.  SUBJECTIVE: No chest pain overnight, walks w/out walker at home, but not very far **still drives**  OBJECTIVE Filed Vitals:   05/05/15 2019 05/05/15 2135 05/06/15 0429 05/06/15 0517  BP:  110/55 109/56   Pulse:  85 94   Temp: 97.7 F (36.5 C) 98 F (36.7 C) 98.4 F (36.9 C) 98.3 F (36.8 C)  TempSrc: Oral Oral Oral Oral  Resp:  21 21   Height:      Weight:   205 lb 11 oz (93.3 kg) 205 lb 6.4 oz (93.169 kg)  SpO2:  100% 97%     Intake/Output Summary (Last 24 hours) at 05/06/15 0858 Last data filed at 05/06/15 0500  Gross per 24 hour  Intake    363 ml  Output    325 ml  Net     38 ml   Filed Weights   05/05/15 0416 05/06/15 0429 05/06/15 0517  Weight: 206 lb 11.2 oz (93.759 kg) 205 lb 11 oz (93.3 kg) 205 lb 6.4 oz (93.169 kg)    PHYSICAL EXAM General: Well developed, well nourished, male in no acute distress. Head: Normocephalic, atraumatic.  Neck: Supple without bruits, JVD minimal elevation. Lungs:  Resp regular and unlabored, dry rales w/ decreased BS bases. Heart: RRR, S1, S2, no S3, S4, or murmur; no rub. Abdomen: Soft, non-tender, non-distended, BS + x 4.  Extremities: No clubbing, cyanosis, no edema.  Neuro: Alert and oriented X 3. Moves all extremities  spontaneously. Psych: Normal affect.  LABS: CBC: Recent Labs  05/04/15 1135 05/05/15 0322  WBC 23.7* 13.4*  HGB 11.3* 10.6*  HCT 33.4* 31.3*  MCV 100.0 100.3*  PLT 95* 93*   INR: Recent Labs  05/04/15 1340  INR 1.31   Basic Metabolic Panel: Recent Labs  05/05/15 1129 05/06/15 0417  NA 134* 133*  K 4.4 4.2  CL 91* 90*  CO2 34* 35*  GLUCOSE 115* 85  BUN 38* 45*  CREATININE 1.71* 1.96*  CALCIUM 8.4* 8.0*   Cardiac Enzymes: Recent Labs  05/03/15 1850 05/03/15 2357 05/04/15 0537  TROPONINI <0.03 0.03 0.11*    Recent Labs  05/03/15 1510  TROPIPOC 0.00   BNP:  B NATRIURETIC PEPTIDE  Date/Time Value Ref Range Status  05/03/2015 02:50 PM 379.7* 0.0 - 100.0 pg/mL Final   Fasting Lipid Panel: Recent Labs  05/04/15 0537  CHOL 98  HDL 51  LDLCALC 38  TRIG 45  CHOLHDL 1.9    TELE:  SR, occ PVCs, 1 pair     Cardiac Cath: 05/05/2015 Conclusion     Mid RCA lesion, 60% stenosed.  Dist LAD lesion, 100% stenosed.  Ost 2nd Diag lesion, 99% stenosed.  Mid LAD-2 lesion, 99% stenosed.  Mid LAD-1 lesion, 80% stenosed.  LM lesion, 45% stenosed.  Dist RCA lesion, 70% stenosed.  Post Atrio-1 lesion, 70% stenosed.  Post Atrio-2 lesion, 70% stenosed.  1st Mrg lesion, 60% stenosed.  Significant coronary calcification with three-vessel coronary obstructive disease.  There is 40-50% eccentric distal left main stenosis; 80% proximal LAD stenosis followed by a 99% bifurcation stenosis involving the takeoff of the first diagonal and LAD with total occlusion of the mid LAD which is old. There are collaterals to the distal LAD from the circumflex marginal branch; 60% stenosis in the OM1 branch of the left circumflex coronary artery; patent stented will RCA with 60% eccentric mid RCA stenosis followed by tandem 70, 70 and 70% distal RCA stenoses beyond the acute margin, after the PDA takeoff, and in the PLA vessel with the distal LAD via the distal  RCA.  RECOMMENDATION:  Angiograms will be reviewed with colleagues. The patient has severe LAD disease with collateralization of the distal LAD, both via the distal circumflex marginal branch and distal RCA. The LAD stenoses are not significantly changed from 2011. The RCA stenoses have significantly progressed since 2011. Increased medical therapy will be initiated with addition of low-dose ranolazine added to his regimen. Consider further evaluation for probable aortic aneurysm. He has been felt not to be an operative candidate in the past, but I will defer this to his primary cardiologist. Hydrate post cath with his creatinine clearance of 40.   Radiology/Studies: Ct Head Wo Contrast 05/05/2015   CLINICAL DATA:  Fall, hit head.  On aspirin and Plavix.  EXAM: CT HEAD WITHOUT CONTRAST  TECHNIQUE: Contiguous axial images were obtained from the base of the skull through the vertex without intravenous contrast.  COMPARISON:  03/10/2005  FINDINGS: Diffuse cerebral atrophy. No acute intracranial abnormality. Specifically, no hemorrhage, hydrocephalus, mass lesion, acute infarction, or significant intracranial injury. No acute calvarial abnormality. Visualized paranasal sinuses and mastoids clear. Orbital soft tissues unremarkable.  IMPRESSION: Atrophy.  No acute intracranial abnormality.   Electronically Signed   By: Charlett Nose M.D.   On: 05/05/2015 07:29   Nm Myocar Single W/spect W/wall Motion And Ef 05/04/2015   There was a large, severe defect at rest as described below.  Stress  images were not done.  The patient was sent to the cath lab.     Current Medications:  . amLODipine  5 mg Oral Daily  . aspirin  81 mg Oral Daily  . calcium carbonate  1 tablet Oral Daily  . cholecalciferol  1,000 Units Oral Daily  . clopidogrel  75 mg Oral Daily  . furosemide  80 mg Oral Daily   And  . furosemide  40 mg Oral Q M,W,F  . heparin  5,000 Units Subcutaneous 3 times per day  . isosorbide  mononitrate  60 mg Oral Daily  . metoprolol succinate  125 mg Oral Daily  . ranolazine  500 mg Oral BID  . simvastatin  20 mg Oral QHS  . sodium chloride  3 mL Intravenous Q12H  . sodium chloride  3 mL Intravenous Q12H  . tamsulosin  0.4 mg Oral Daily  . tiotropium  18 mcg Inhalation Daily  . vitamin B-12  1,000 mcg Oral Daily      ASSESSMENT AND PLAN: 1. NSTEMI/CAD: - Cath showed significant coronary calcification with three-vessel coronary obstructive disease; severe LAD disease with collateralization of the distal LAD, both via the distal circumflex marginal branch and distal RCA.The LAD stenoses are not significantly changed from 2011.The RCA stenoses have significantly progressed since 2011 and patent RCA.  - Recommended increased medical therapy  with addition of low -dose ranolazine; further evaluation of aortic aneurysm, felt non operative candidate in past.  - No Further chest pain - Continue ASA, Norvasc, Plavix, Imdur, Ranexa, Statin, Toprol Xl - Toprol XL increased 100->125 mg 06/22, BP/HR OK  2. Essential HTN: - Stable - good control  3. HL:  - LDL 38 - cont statin.  4. ICM:  - Last documented EF - 32% by remote nuc study. Euvolemic on exam. Cont bb. Arb discontinued yesterday, restart once creatinine stable.   5. CKD III:  - 35/1.40 on 06/14>>(06/20) 30/1.43>>37/1.60>>45/1.96 (06/23) - needs close f/u  6. Chronic thrombocytopenia:  - Plt 91K on admission - on chronic asa/plavix. F/u given heparinization. 06/22 platelets 93.  7. Fall  - Fall l06/21. CT of head without acute abnormality.  -  PT eval ordered, will change to imminent discharge.   Melida Quitter , PA-C 8:58 AM 05/06/2015

## 2015-05-07 ENCOUNTER — Encounter (HOSPITAL_COMMUNITY): Payer: Self-pay | Admitting: Physician Assistant

## 2015-05-07 ENCOUNTER — Other Ambulatory Visit: Payer: Self-pay | Admitting: Physician Assistant

## 2015-05-07 ENCOUNTER — Other Ambulatory Visit: Payer: Self-pay | Admitting: Family Medicine

## 2015-05-07 DIAGNOSIS — E871 Hypo-osmolality and hyponatremia: Secondary | ICD-10-CM

## 2015-05-07 DIAGNOSIS — I214 Non-ST elevation (NSTEMI) myocardial infarction: Secondary | ICD-10-CM

## 2015-05-07 LAB — URINALYSIS, ROUTINE W REFLEX MICROSCOPIC
Bilirubin Urine: NEGATIVE
GLUCOSE, UA: NEGATIVE mg/dL
Ketones, ur: NEGATIVE mg/dL
Leukocytes, UA: NEGATIVE
Nitrite: NEGATIVE
PROTEIN: NEGATIVE mg/dL
SPECIFIC GRAVITY, URINE: 1.013 (ref 1.005–1.030)
Urobilinogen, UA: 1 mg/dL (ref 0.0–1.0)
pH: 5 (ref 5.0–8.0)

## 2015-05-07 LAB — BASIC METABOLIC PANEL
ANION GAP: 12 (ref 5–15)
BUN: 56 mg/dL — ABNORMAL HIGH (ref 6–20)
CALCIUM: 8.7 mg/dL — AB (ref 8.9–10.3)
CO2: 34 mmol/L — ABNORMAL HIGH (ref 22–32)
Chloride: 90 mmol/L — ABNORMAL LOW (ref 101–111)
Creatinine, Ser: 2.16 mg/dL — ABNORMAL HIGH (ref 0.61–1.24)
GFR calc Af Amer: 30 mL/min — ABNORMAL LOW (ref >60)
GFR calc non Af Amer: 26 mL/min — ABNORMAL LOW (ref >60)
Glucose, Bld: 111 mg/dL — ABNORMAL HIGH (ref 65–99)
Potassium: 4.2 mmol/L (ref 3.5–5.1)
SODIUM: 136 mmol/L (ref 135–145)

## 2015-05-07 LAB — URINE MICROSCOPIC-ADD ON

## 2015-05-07 NOTE — Progress Notes (Signed)
Nephrology consulted, talked with Dr. Kathrene Bongo, nephrology will see patient tomorrow. Given rising Cr, will keep overnight for obs and obtain BMET in am.  Signed, Azalee Course PA Pager: [phone]

## 2015-05-07 NOTE — Care Management Note (Addendum)
Case Management Note  Patient Details  Name: Bill Walker MRN: [id number] Date of Birth: April 26, 1928  Subjective/Objective: Pt admitted for Nstemi. Pt is from home with wife. Plan to return home once stable.               Action/Plan: CM did offer choice for [facility] services and pt chose [facility]. Referral made and SOC to begin within 24-48 hrs post d/c. Pt stating no HHRN services needed. No further needs from CM at this time.    Expected Discharge Date:                  Expected Discharge Plan:  Home w Home Health Services  In-House Referral:     Discharge planning Services  CM Consult  Post Acute Care Choice:  Home Health, Durable Medical Equipment Choice offered to:  Patient, Spouse  DME Arranged:  Walker rolling DME Agency:  [facility]  HH Arranged:  PT HH Agency:  Other - See comment ([facility] )  Status of Service:  Completed, signed off  Medicare Important Message Given:  Yes Date Medicare IM Given:  05/07/15 Medicare IM give by:  Tomi Bamberger, RN,BSN  Date Additional Medicare IM Given:    Additional Medicare Important Message give by:     If discussed at Long Length of Stay Meetings, dates discussed:    Additional Comments:  1128 05-08-15 Pt now agreeable to [facility] Services with [facility]. Please modify HH Orders and add RN Services. No further needs from CM at this time. Gala Lewandowsky, RN,BSN    Gala Lewandowsky, RN 05/07/2015, 11:48 AM

## 2015-05-07 NOTE — Discharge Summary (Signed)
CARDIOLOGY DISCHARGE SUMMARY   Patient ID: Bill Walker MRN: [id number] DOB/AGE: 06/11/1928 81 y.o.  Admit date: 05/03/2015 Discharge date: 05/09/2015  PCP: Roxy Manns, MD Primary Cardiologist: Dr. Eden Emms  Primary Discharge Diagnosis:  Non-STEMI  **Cath this admission revealing severe diffuse 3VD  Med Rx.  Secondary Discharge Diagnosis:    Essential hypertension   Ischemic cardiomyopathy   Chronic systolic CHF (congestive heart failure)   Coronary artery disease   Hyperlipidemia   Thrombocytopenia   CKD (chronic kidney disease), stage III/Contrast induced nephropathy   COPD (chronic obstructive pulmonary disease)   Unstable angina  Procedures: Cardiac catheterization, coronary arteriogram, CT of the head without contrast, two-view chest x-ray  Hospital Course: Bill Walker is a 81 y.o. male with a history of 3 vessel CAD medically, ischemic cardiomyopathy, cor pulmonale, COPD on home O2, thrombocytopenia, and CKD stage III. He had chest pain and came to the hospital where he was admitted for further evaluation and treatment.  Initially stress test was planned, but cardiac enzymes elevated indicating a non-STEMI. Cardiac catheterization was scheduled. He was found to have severe native three-vessel disease with medical therapy recommended. Ranolazine was added to his medication regimen. He tolerated this well and did not have further c/p.  He was noted to have a history of aortic aneurysm, not an operative candidate in the past and no further workup was done. Pain-free on current therapy. His EF will be assessed by an echo as an outpatient. Previously it was 32%.  His creatinine increased after cath in his ARB was discontinued. He was seen by nephrology and creat rise was felt to be secondary to advanced age, CKD III, ARB therapy, and contrast induced nephropathy.  Creat has improved over the past 48 hrs and he was felt to be stable for discharge with a plan for f/u BMET later  this week.  He was also noted to have a history of thrombocytopenia and his platelet count was stable.  Of note, Bill Walker had a fall and it was felt to be a mechanical fall. He hit his head and CT of the head was performed which showed no acute abnormality. There was consideration for weakness and a PT evaluation was ordered.  Arrangements have been made for HHPT/RN and rolling walker.  He will be discharged home today in good condition.  Labs:   Lab Results  Component Value Date   WBC 8.6 05/06/2015   HGB 10.7* 05/06/2015   HCT 31.5* 05/06/2015   MCV 100.0 05/06/2015   PLT 68* 05/06/2015     Recent Labs Lab 05/09/15 0350  NA 135  K 4.1  CL 93*  CO2 37*  BUN 52*  CREATININE 1.85*  CALCIUM 8.3*  GLUCOSE 115*   Lab Results  Component Value Date   TROPONINI 0.11* 05/04/2015   Lipid Panel     Component Value Date/Time   CHOL 98 05/04/2015 0537   TRIG 45 05/04/2015 0537   HDL 51 05/04/2015 0537   CHOLHDL 1.9 05/04/2015 0537   VLDL 9 05/04/2015 0537   LDLCALC 38 05/04/2015 0537    B NATRIURETIC PEPTIDE  Date/Time Value Ref Range Status  05/03/2015 02:50 PM 379.7* 0.0 - 100.0 pg/mL Final     Radiology: Dg Chest 2 View 05/03/2015   CLINICAL DATA:  Sudden onset of substernal chest pain radiating to the back  EXAM: CHEST  2 VIEW  COMPARISON:  12/29/2010  FINDINGS: Chronic cardiomegaly.  Aortic and hilar contours are stable.  Small  pleural effusions. There is a retrocardiac rounded opacity which correlates with probable rounded atelectasis on chest CT from 2010. No evidence of edema or pneumonia.  IMPRESSION: 1. Cardiomegaly with small pleural effusions but no pulmonary edema. 2. Left lower lobe scarring, present since at least 2010.   Electronically Signed   By: Marnee Spring M.D.   On: 05/03/2015 15:44   Ct Head Wo Contrast 05/05/2015   CLINICAL DATA:  Fall, hit head.  On aspirin and Plavix.  EXAM: CT HEAD WITHOUT CONTRAST  TECHNIQUE: Contiguous axial images were obtained  from the base of the skull through the vertex without intravenous contrast.  COMPARISON:  03/10/2005  FINDINGS: Diffuse cerebral atrophy. No acute intracranial abnormality. Specifically, no hemorrhage, hydrocephalus, mass lesion, acute infarction, or significant intracranial injury. No acute calvarial abnormality. Visualized paranasal sinuses and mastoids clear. Orbital soft tissues unremarkable.  IMPRESSION: Atrophy.  No acute intracranial abnormality.   Electronically Signed   By: Charlett Nose M.D.   On: 05/05/2015 07:29   Nm Myocar Single W/spect W/wall Motion And Ef 05/04/2015   There was a large, severe defect at rest as described below.  Stress  images were not done.  The patient was sent to the cath lab.    Cardiac Cath: 05/04/2015 Conclusion    Coronary Findings     Dominance: Right    Left Main   . LM lesion, 45% stenosed.      Left Anterior Descending  Dist LAD filled by collaterals from 2nd Mrg.   . Mid LAD-1 lesion, 80% stenosed.   . Mid LAD-2 lesion, 99% stenosed. located at the major branch .   Marland Kitchen Dist LAD lesion, 100% stenosed. and has left-to-left collateral flow.   . Second Diagonal Branch   . Ost 2nd Diag lesion, 99% stenosed. located at the major branch .      Left Circumflex   . First Obtuse Marginal Branch   . 1st Mrg lesion, 60% stenosed.      Right Coronary Artery   . Prox RCA lesion, 0% stenosed. Previously placed Prox RCA stent (unknown type) is patent.   . Mid RCA lesion, 60% stenosed.   . Dist RCA lesion, 70% stenosed. discrete .   Marland Kitchen Right Posterior Atrioventricular Branch   . Post Atrio-1 lesion, 70% stenosed.   Marland Kitchen Post Atrio-2 lesion, 70% stenosed.       EKG: 05/04/2015 Sinus tachycardia Vent. rate 114 BPM   FOLLOW UP PLANS AND APPOINTMENTS Allergies  Allergen Reactions  . Doxazosin Mesylate     rash  . Penicillins     REACTION: unknown reaction     Medication List    STOP taking these medications        losartan 50 MG tablet  Commonly  known as:  COZAAR      TAKE these medications        amLODipine 5 MG tablet  Commonly known as:  NORVASC  Take 1 tablet (5 mg total) by mouth daily.     aspirin 81 MG tablet  Take 1 tablet (81 mg total) by mouth daily.     calcium carbonate 500 MG chewable tablet  Commonly known as:  TUMS - dosed in mg elemental calcium  Chew 1 tablet by mouth daily.     cholecalciferol 1000 UNITS tablet  Commonly known as:  VITAMIN D  Take 1 tablet (1,000 Units total) by mouth daily.     clopidogrel 75 MG tablet  Commonly known as:  PLAVIX  TAKE ONE TABLET BY MOUTH ONCE DAILY     diphenhydrAMINE 25 MG tablet  Commonly known as:  SOMINEX  Take 25 mg by mouth at bedtime as needed for sleep.     furosemide 80 MG tablet  Commonly known as:  LASIX  Take 1 tablet (80 mg total) by mouth daily. TAKE 1 TABLET EVERY MORNING AND TAKE 1/2 TABLET IN THE EVENING ON DAYS MONDAY,    WEDNESDAY, AND FRIDAY     isosorbide mononitrate 30 MG 24 hr tablet  Commonly known as:  IMDUR  Take 2 tablets (60 mg total) by mouth daily.     metoprolol succinate 100 MG 24 hr tablet  Commonly known as:  TOPROL-XL  TAKE 1 TABLET BY MOUTH EVERY DAY WITH A MEAL     metoprolol succinate 25 MG 24 hr tablet  Commonly known as:  TOPROL-XL  Take 1 tablet (25 mg total) by mouth daily. Take with or immediately following a meal. Take with 100 mg tablet.     nitroGLYCERIN 0.4 MG SL tablet  Commonly known as:  NITROSTAT  Place 1 tablet (0.4 mg total) under the tongue every 5 (five) minutes as needed for chest pain.     NON FORMULARY  Oxygen- 2 liters every 24 hours     ranolazine 500 MG 12 hr tablet  Commonly known as:  RANEXA  Take 1 tablet (500 mg total) by mouth 2 (two) times daily.     simvastatin 20 MG tablet  Commonly known as:  ZOCOR  TAKE ONE TABLET BY MOUTH AT BEDTIME     tamsulosin 0.4 MG Caps capsule  Commonly known as:  FLOMAX  Take 1 capsule (0.4 mg total) by mouth daily.     tiotropium 18 MCG  inhalation capsule  Commonly known as:  SPIRIVA HANDIHALER  INHALE ONE CAPSULE BY MOUTH ONCE DAILY     vitamin B-12 1000 MCG tablet  Commonly known as:  CYANOCOBALAMIN  Take 1,000 mcg by mouth daily.         Follow-up Information    Follow up with [facility].   Why:  Research officer, political party information:   [address] [place] [address] [phone]       Follow up with [facility].   Specialty:  Home Health Services   Why:  Physical Therapy, Registered Nurse.    Contact information:   [address] [place] [address] [phone]       Follow up with Charlton Haws, MD In 1 week.   Specialty:  Cardiology   Why:  We will arrange for office f/u and blood work on Friday and contact you.   Contact information:   [address] [place] [address] [phone]       Follow up with Roxy Manns, MD.   Specialties:  Family Medicine, Radiology   Why:  1-2 wks.   Contact information:   [address] [place] [address] [place] [address] [phone]       BRING ALL MEDICATIONS WITH YOU TO FOLLOW UP APPOINTMENTS  Time spent with patient to include physician time: 48 min Signed: Nicolasa Ducking, NP 05/09/2015, 12:55 PM   Hillis Range MD, [facility] 05/09/2015 1:30 PM

## 2015-05-07 NOTE — Progress Notes (Signed)
SUBJECTIVE:  No complaints  OBJECTIVE:   Vitals:   Filed Vitals:   05/06/15 2028 05/07/15 0500 05/07/15 0939 05/07/15 0943  BP: 111/58 142/68 120/74   Pulse: 87 96  90  Temp: 97.9 F (36.6 C) 97.7 F (36.5 C)    TempSrc: Oral Oral    Resp: 20 20    Height:      Weight:  198 lb (89.812 kg)    SpO2: 100% 94%     I&O's:   Intake/Output Summary (Last 24 hours) at 05/07/15 1026 Last data filed at 05/07/15 0610  Gross per 24 hour  Intake    486 ml  Output    700 ml  Net   -214 ml   TELEMETRY: Reviewed telemetry pt in NSR:     PHYSICAL EXAM General: Well developed, well nourished, in no acute distress Head: Eyes PERRLA, No xanthomas.   Normal cephalic and atramatic  Lungs:   Few crackles at bases Heart:   HRRR S1 S2 Pulses are 2+ & equal. Abdomen: Bowel sounds are positive, abdomen soft and non-tender without masses Extremities:   No clubbing, cyanosis or edema.  DP +1 Neuro: Alert and oriented X 3. Psych:  Good affect, responds appropriately   LABS: Basic Metabolic Panel:  Recent Labs  41/93/79 0417 05/07/15 0442  NA 133* 136  K 4.2 4.2  CL 90* 90*  CO2 35* 34*  GLUCOSE 85 111*  BUN 45* 56*  CREATININE 1.96* 2.16*  CALCIUM 8.0* 8.7*   Liver Function Tests: No results for input(s): AST, ALT, ALKPHOS, BILITOT, PROT, ALBUMIN in the last 72 hours. No results for input(s): LIPASE, AMYLASE in the last 72 hours. CBC:  Recent Labs  05/05/15 0322 05/06/15 1142  WBC 13.4* 8.6  HGB 10.6* 10.7*  HCT 31.3* 31.5*  MCV 100.3* 100.0  PLT 93* 68*   Cardiac Enzymes: No results for input(s): CKTOTAL, CKMB, CKMBINDEX, TROPONINI in the last 72 hours. BNP: Invalid input(s): POCBNP D-Dimer: No results for input(s): DDIMER in the last 72 hours. Hemoglobin A1C: No results for input(s): HGBA1C in the last 72 hours. Fasting Lipid Panel: No results for input(s): CHOL, HDL, LDLCALC, TRIG, CHOLHDL, LDLDIRECT in the last 72 hours. Thyroid Function Tests: No results  for input(s): TSH, T4TOTAL, T3FREE, THYROIDAB in the last 72 hours.  Invalid input(s): FREET3 Anemia Panel: No results for input(s): VITAMINB12, FOLATE, FERRITIN, TIBC, IRON, RETICCTPCT in the last 72 hours. Coag Panel:   Lab Results  Component Value Date   INR 1.31 05/04/2015   INR 1.03 03/25/2010   INR 1.0 07/07/2009    RADIOLOGY: Dg Chest 2 View  05/03/2015   CLINICAL DATA:  Sudden onset of substernal chest pain radiating to the back  EXAM: CHEST  2 VIEW  COMPARISON:  12/29/2010  FINDINGS: Chronic cardiomegaly.  Aortic and hilar contours are stable.  Small pleural effusions. There is a retrocardiac rounded opacity which correlates with probable rounded atelectasis on chest CT from 2010. No evidence of edema or pneumonia.  IMPRESSION: 1. Cardiomegaly with small pleural effusions but no pulmonary edema. 2. Left lower lobe scarring, present since at least 2010.   Electronically Signed   By: Marnee Spring M.D.   On: 05/03/2015 15:44   Ct Head Wo Contrast  05/05/2015   CLINICAL DATA:  Fall, hit head.  On aspirin and Plavix.  EXAM: CT HEAD WITHOUT CONTRAST  TECHNIQUE: Contiguous axial images were obtained from the base of the skull through the vertex without intravenous contrast.  COMPARISON:  03/10/2005  FINDINGS: Diffuse cerebral atrophy. No acute intracranial abnormality. Specifically, no hemorrhage, hydrocephalus, mass lesion, acute infarction, or significant intracranial injury. No acute calvarial abnormality. Visualized paranasal sinuses and mastoids clear. Orbital soft tissues unremarkable.  IMPRESSION: Atrophy.  No acute intracranial abnormality.   Electronically Signed   By: Charlett Nose M.D.   On: 05/05/2015 07:29   Nm Myocar Single W/spect W/wall Motion And Ef  05/04/2015   There was a large, severe defect at rest as described below.  Stress  images were not done.  The patient was sent to the cath lab.   ASSESSMENT AND PLAN  1. NSTEMI/CAD: - Cath showed significant coronary  calcification with three-vessel coronary obstructive disease; severe LAD disease with collateralization of the distal LAD, both via the distal circumflex marginal branch and distal RCA.The LAD stenoses are not significantly changed from 2011.The RCA stenoses have progressed since 2011 with several segmental 70% lesions.   - Recommended increased medical therapy with addition of low -dose ranolazine; further evaluation of aortic aneurysm, felt non operative candidate in past. Cannot get Chest/ABD CT at present due to increased creatinine. May need to evaluate as outpt.Discussed and reviewed films with Dr. Tresa Endo. Feel medical therapy is best option at this time.  If he has further CP may need to consider PCI. - No Further chest pain - Continue ASA, Norvasc, Plavix, Imdur, Ranexa, Statin, Toprol Xl - HR improved after increasing BB   2. Essential HTN: - Stable  3. HL:  - LDL 38 - cont statin.  4. ICM:  - Last documented EF - 32% by remote nuc study. Euvolemic on exam. Cont bb. ARB on hold due to renal insuff  5. CKD III:  - Creatinine bumped post cath to 1.96 and now 2.1 with increased CO2 and BUN.  ARB and diuretics on hold.  LVEDP was 25 at cath and he has a few crackles at lung bases.  I will ask renal to come by and evaluate for recommendations.    6. Chronic thrombocytopenia:  - Plt 91K on admission - on chronic asa/plavix.   7. Fall  - Fall after tripping the other night. CT of head without acute abnormality. PT eval pending  8. Leukocytosis ? Etiology. Now resolved.  Afebrile. Chest xray clear. UA negative for infection. There were moderate RBC's in urine.   Nearing being ready for d/c except for worsening renal function.  Will see what renal has to say.  PT to evaluate again today.  Possible d/c later today or tomorrow.    Quintella Reichert, MD  05/07/2015  10:26 AM

## 2015-05-08 LAB — BASIC METABOLIC PANEL
Anion gap: 6 (ref 5–15)
BUN: 53 mg/dL — ABNORMAL HIGH (ref 6–20)
CALCIUM: 8.3 mg/dL — AB (ref 8.9–10.3)
CHLORIDE: 91 mmol/L — AB (ref 101–111)
CO2: 36 mmol/L — ABNORMAL HIGH (ref 22–32)
CREATININE: 2.06 mg/dL — AB (ref 0.61–1.24)
GFR calc non Af Amer: 28 mL/min — ABNORMAL LOW (ref >60)
GFR, EST AFRICAN AMERICAN: 32 mL/min — AB (ref >60)
GLUCOSE: 119 mg/dL — AB (ref 65–99)
Potassium: 4.3 mmol/L (ref 3.5–5.1)
SODIUM: 133 mmol/L — AB (ref 135–145)

## 2015-05-08 MED ORDER — TIOTROPIUM BROMIDE MONOHYDRATE 18 MCG IN CAPS
18.0000 ug | ORAL_CAPSULE | Freq: Every day | RESPIRATORY_TRACT | Status: DC
  Administered 2015-05-08 – 2015-05-09 (×2): 18 ug via RESPIRATORY_TRACT
  Filled 2015-05-08: qty 5

## 2015-05-08 NOTE — Consult Note (Signed)
[facility] Renal Consultation Note  Requesting MD: Turner Indication for Consultation: A on CRF  HPI:  Bill Walker is a 81 y.o. male with Pmhx significant for HTN , hyperlipidemia, reported ischemic cardiomyopathy with low EF and CAD- he also was noted to have stage 3 CKD with creatinine seeming to be between 1.5 and 1.7 recently.  He is followed by Dr. Posey Pronto in our office. He presented with CP and since had not had a cath for several years had one for eval.  His creatinine day before cath was 1.4 indicating a GFR in the low 40's.  He underwent cardiac cath on 6/21- I was unable to find how much contrast was used- no lv gram was done- was given at least post cath hydration.  After cath, creatinine immediately went up to 1.8 then down to 1.6- then progressively up 1.9- then 2.1 - it was for this reason I was called late yesterday.  Creatinine is down to 2.0 today - uop recorded is not great- patient and wife both report that they think it is picking up-  U/A is pretty unremarkable.  Of note, pt was on losartan until day of cath.  Patient denies any worsening SOB or nausea or worsening edema  CREAT  Date/Time Value Ref Range Status  12/16/2009 11:05 AM 1.7* 0.6 - 1.2 mg/dl Final  08/13/2009 12:47 PM 1.5* 0.6 - 1.2 mg/dl Final  03/17/2009 01:01 PM 1.6* 0.6 - 1.2 mg/dl Final   CREATININE, SER  Date/Time Value Ref Range Status  05/08/2015 07:29 AM 2.06* 0.61 - 1.24 mg/dL Final  05/07/2015 04:42 AM 2.16* 0.61 - 1.24 mg/dL Final  05/06/2015 04:17 AM 1.96* 0.61 - 1.24 mg/dL Final  05/05/2015 11:29 AM 1.71* 0.61 - 1.24 mg/dL Final  05/05/2015 03:22 AM 1.60* 0.61 - 1.24 mg/dL Final  05/04/2015 11:35 AM 1.83* 0.61 - 1.24 mg/dL Final  05/03/2015 02:50 PM 1.43* 0.61 - 1.24 mg/dL Final  04/27/2015 10:01 AM 1.40 0.40 - 1.50 mg/dL Final  05/12/2014 08:39 AM 1.5 0.4 - 1.5 mg/dL Final  12/12/2013 09:11 AM 1.7* 0.4 - 1.5 mg/dL Final  11/26/2013 02:26 PM 1.7* 0.4 - 1.5 mg/dL Final   08/28/2012 09:25 AM 1.7* 0.4 - 1.5 mg/dL Final  09/11/2011 08:41 AM 1.6* 0.4 - 1.5 mg/dL Final  01/03/2011 06:10 AM 1.41 0.4 - 1.5 mg/dL Final  01/02/2011 08:05 AM 1.29 0.4 - 1.5 mg/dL Final  01/01/2011 05:50 AM 1.24 0.4 - 1.5 mg/dL Final  12/31/2010 11:02 AM 1.45 0.4 - 1.5 mg/dL Final  12/30/2010 11:56 AM 1.57* 0.4 - 1.5 mg/dL Final  12/29/2010 09:59 AM 1.65* 0.4 - 1.5 mg/dL Final  12/29/2010 03:53 AM 1.70* 0.4 - 1.5 mg/dL Final  12/28/2010 06:50 AM 2.00* 0.4 - 1.5 mg/dL Final  12/27/2010 02:54 PM 2.13* 0.4 - 1.5 mg/dL Final  12/27/2010 05:00 AM 2.25* 0.4 - 1.5 mg/dL Final  12/26/2010 05:40 AM 2.39* 0.4 - 1.5 mg/dL Final  12/25/2010 11:57 PM 2.28* 0.4 - 1.5 mg/dL Final  12/25/2010 04:14 AM 2.30* 0.4 - 1.5 mg/dL Final  12/24/2010 04:30 AM 2.20* 0.4 - 1.5 mg/dL Final  12/23/2010 03:17 AM 3.10* 0.4 - 1.5 mg/dL Final  12/22/2010 03:13 PM 4.59* 0.4 - 1.5 mg/dL Final  11/29/2010 12:53 PM 1.75* 0.40 - 1.50 mg/dL Final  07/11/2010 09:35 AM 1.4 0.4-1.5 mg/dL Final  03/26/2010 03:40 AM 1.43 0.4 - 1.5 mg/dL Final  03/25/2010 01:49 PM 1.38 0.4 - 1.5 mg/dL Final  03/24/2010 07:43 AM 1.4 0.4 - 1.5 mg/dL  Final  03/08/2010 02:57 PM 1.6* 0.4-1.5 mg/dL Final  02/25/2010 10:27 AM 1.6* 0.4-1.5 mg/dL Final  07/08/2009 04:25 AM 1.30 0.4 - 1.5 mg/dL Final  07/07/2009 05:30 AM 1.23 0.4 - 1.5 mg/dL Final  07/06/2009 03:44 AM 1.11 0.4 - 1.5 mg/dL Final  07/05/2009 05:33 AM 1.30 0.4 - 1.5 mg/dL Final  07/04/2009 03:24 AM 1.7* 0.4 - 1.5 mg/dL Final  02/02/2009 10:03 AM 1.5 0.4-1.5 mg/dL Final  03/05/2007 10:07 AM 1.5 0.4-1.5 mg/dL Final     PMHx:   Past Medical History  Diagnosis Date  . Ischemic cardiomyopathy     a. 02/2005 EF 32% (myoview report).  . Hypertension   . Coronary artery disease     a. s/p prior MI w/ RCA stenting;  b. 03/2011 Cath: LM 20d, LAD 40p/100p, LCX 40p, L->L collats to distal LAD, RCA dominant, patent prox stent, 47m ISR, 50-51m, 50-60d, 60d-->Med Rx.  Marland Kitchen Hyperlipidemia   .  Vitamin B12 deficiency   . History of pneumonia   . Thrombocytopenia   . Cor pulmonale   . Hearing loss   . Alcohol abuse, unspecified   . Tobacco use disorder   . CKD (chronic kidney disease), stage III   . Osteoarthritis   . Gout   . Diverticulosis of colon   . COPD (chronic obstructive pulmonary disease)   . Anemia   . Bronchiectasis     CT Chest 07/07/09 bilateral lower lobe medial bronchiectasis  . Chronic systolic CHF (congestive heart failure)     a. 02/2005 EF 32% (myoview report).    Past Surgical History  Procedure Laterality Date  . Cardiac catheterization      (catheterization 2006 demonstrated an   occluded LAD.  There is a proximal stent in the RCA which was   patent.  He had distal 75% and 95% RCA lesions.  Initially it was  thought that he would have PCI but he was managed medically).  . Mri      Lumbar disk disease 06/1999  . Esophagogastroduodenoscopy      gastritis 12/1999  . Cardiac catheterization N/A 05/04/2015    Procedure: Left Heart Cath and Coronary Angiography;  Surgeon: Troy Sine, MD; LAD under percent, D2 99%, CFX patent, OM1 60% RCA 70%, previous stent is patent, no LV gram, 100 mL dye used    Family Hx:  Family History  Problem Relation Age of Onset  . Lymphoma Brother   . Prostate cancer Brother     Social History:  reports that he quit smoking about 18 years ago. He does not have any smokeless tobacco history on file. He reports that he does not drink alcohol or use illicit drugs.  Allergies:  Allergies  Allergen Reactions  . Doxazosin Mesylate     rash  . Penicillins     REACTION: unknown reaction    Medications: Prior to Admission medications   Medication Sig Start Date End Date Taking? Authorizing Provider  aspirin 325 MG tablet Take 325 mg by mouth daily.     Yes Historical Provider, MD  calcium carbonate (TUMS - DOSED IN MG ELEMENTAL CALCIUM) 500 MG chewable tablet Chew 1 tablet by mouth daily.   Yes Historical Provider, MD   cholecalciferol (VITAMIN D) 1000 UNITS tablet Take 1 tablet (1,000 Units total) by mouth daily. 04/01/13  Yes Josue Hector, MD  clopidogrel (PLAVIX) 75 MG tablet TAKE ONE TABLET BY MOUTH ONCE DAILY 03/29/15  Yes Abner Greenspan, MD  diphenhydrAMINE ([place]) 25 MG  tablet Take 25 mg by mouth at bedtime as needed for sleep.   Yes Historical Provider, MD  furosemide (LASIX) 80 MG tablet Take 1 tablet (80 mg total) by mouth daily. TAKE 1 TABLET EVERY MORNING AND TAKE 1/2 TABLET IN THE EVENING ON DAYS MONDAY,    WEDNESDAY, AND FRIDAY 02/19/15  Yes Abner Greenspan, MD  isosorbide mononitrate (IMDUR) 30 MG 24 hr tablet Take 2 tablets (60 mg total) by mouth daily. 03/11/15  Yes Abner Greenspan, MD  losartan (COZAAR) 50 MG tablet Take 0.5 tablets (25 mg total) by mouth daily. 04/05/15  Yes Josue Hector, MD  metoprolol succinate (TOPROL-XL) 100 MG 24 hr tablet TAKE 1 TABLET BY MOUTH EVERY DAY WITH A MEAL 12/25/14  Yes Abner Greenspan, MD  nitroGLYCERIN (NITROSTAT) 0.4 MG SL tablet Place 1 tablet (0.4 mg total) under the tongue every 5 (five) minutes as needed for chest pain. 04/05/15  Yes Josue Hector, MD  NON FORMULARY Oxygen- 2 liters every 24 hours    Yes Historical Provider, MD  simvastatin (ZOCOR) 20 MG tablet TAKE ONE TABLET BY MOUTH AT BEDTIME 03/29/15  Yes Abner Greenspan, MD  tamsulosin (FLOMAX) 0.4 MG CAPS capsule Take 1 capsule (0.4 mg total) by mouth daily. 04/27/15  Yes Abner Greenspan, MD  tiotropium (SPIRIVA HANDIHALER) 18 MCG inhalation capsule INHALE ONE CAPSULE BY MOUTH ONCE DAILY 02/08/15  Yes Abner Greenspan, MD  vitamin B-12 (CYANOCOBALAMIN) 1000 MCG tablet Take 1,000 mcg by mouth daily.   Yes Historical Provider, MD    I have reviewed the patient's current medications.  Labs:  Results for orders placed or performed during the hospital encounter of 05/03/15 (from the past 48 hour(s))  CBC     Status: Abnormal   Collection Time: 05/06/15 11:42 AM  Result Value Ref Range   WBC 8.6 4.0 - 10.5 K/uL    RBC 3.15 (L) 4.22 - 5.81 MIL/uL   Hemoglobin 10.7 (L) 13.0 - 17.0 g/dL   HCT 31.5 (L) 39.0 - 52.0 %   MCV 100.0 78.0 - 100.0 fL   MCH 34.0 26.0 - 34.0 pg   MCHC 34.0 30.0 - 36.0 g/dL   RDW 13.8 11.5 - 15.5 %   Platelets 68 (L) 150 - 400 K/uL    Comment: CONSISTENT WITH PREVIOUS RESULT  Basic metabolic panel     Status: Abnormal   Collection Time: 05/07/15  4:42 AM  Result Value Ref Range   Sodium 136 135 - 145 mmol/L   Potassium 4.2 3.5 - 5.1 mmol/L   Chloride 90 (L) 101 - 111 mmol/L   CO2 34 (H) 22 - 32 mmol/L   Glucose, Bld 111 (H) 65 - 99 mg/dL   BUN 56 (H) 6 - 20 mg/dL   Creatinine, Ser 2.16 (H) 0.61 - 1.24 mg/dL   Calcium 8.7 (L) 8.9 - 10.3 mg/dL   GFR calc non Af Amer 26 (L) >60 mL/min   GFR calc Af Amer 30 (L) >60 mL/min    Comment: (NOTE) The eGFR has been calculated using the CKD EPI equation. This calculation has not been validated in all clinical situations. eGFR's persistently <60 mL/min signify possible Chronic Kidney Disease.    Anion gap 12 5 - 15  Urinalysis, Routine w reflex microscopic (not at [facility])     Status: Abnormal   Collection Time: 05/07/15  5:53 AM  Result Value Ref Range   Color, Urine YELLOW YELLOW   APPearance CLOUDY (A) CLEAR  Specific Gravity, Urine 1.013 1.005 - 1.030   pH 5.0 5.0 - 8.0   Glucose, UA NEGATIVE NEGATIVE mg/dL   Hgb urine dipstick TRACE (A) NEGATIVE   Bilirubin Urine NEGATIVE NEGATIVE   Ketones, ur NEGATIVE NEGATIVE mg/dL   Protein, ur NEGATIVE NEGATIVE mg/dL   Urobilinogen, UA 1.0 0.0 - 1.0 mg/dL   Nitrite NEGATIVE NEGATIVE   Leukocytes, UA NEGATIVE NEGATIVE  Urine microscopic-add on     Status: Abnormal   Collection Time: 05/07/15  5:53 AM  Result Value Ref Range   WBC, UA 0-2 <3 WBC/hpf   RBC / HPF 0-2 <3 RBC/hpf   Bacteria, UA FEW (A) RARE   Casts HYALINE CASTS (A) NEGATIVE  Basic metabolic panel     Status: Abnormal   Collection Time: 05/08/15  7:29 AM  Result Value Ref Range   Sodium 133 (L) 135 - 145 mmol/L    Potassium 4.3 3.5 - 5.1 mmol/L   Chloride 91 (L) 101 - 111 mmol/L   CO2 36 (H) 22 - 32 mmol/L   Glucose, Bld 119 (H) 65 - 99 mg/dL   BUN 53 (H) 6 - 20 mg/dL   Creatinine, Ser 2.06 (H) 0.61 - 1.24 mg/dL   Calcium 8.3 (L) 8.9 - 10.3 mg/dL   GFR calc non Af Amer 28 (L) >60 mL/min   GFR calc Af Amer 32 (L) >60 mL/min    Comment: (NOTE) The eGFR has been calculated using the CKD EPI equation. This calculation has not been validated in all clinical situations. eGFR's persistently <60 mL/min signify possible Chronic Kidney Disease.    Anion gap 6 5 - 15     ROS:  A comprehensive review of systems was negative.  Physical Exam: Filed Vitals:   05/08/15 0959  BP: 130/70  Pulse: 88  Temp: 97.8 F (36.6 C)  Resp: 18     General: elderly WM- in NAD HEENT: PERRLA, EOMI, mucous membranes moist Neck: no JVD Heart: RRR Lungs: chronic BS- poor air movement Abdomen: soft, non tender Extremities: no significant peripheral edema Skin: warm and dry Neuro: alert- a little sluggish- HOH- wife does not indicate this is out of ordinary   Assessment/Plan: 81 year old WM with stage 3 CKD at baseline- now s/p cardiac cath now 4 days ago- had progressive worsening of creatinine post cath but now maybe leveling off 1.Renal- A on CRF in the setting of being s/p cardiac cath with 81 year old kidneys and ARB in his system- I think these are the reasons that he may have had a delayed course of contrast nephropathy.  Fortunately appears to be headed in the right direction now.  I am not sure all of his urine is being recorded-  I think if creatinine trends down again tomorrow would be OK for discharge and to follow as OP.  Patient sees Dr. Posey Pronto at [facility] but only every 6 months- he can get labs thru his PCP to insure improvement.  This may take a few weeks to get back to his baseline 2. Hypertension/volume  - well controlled on norvasc /toprol and flomax.  i would not restart arb at discharge 3. Anemia  -  mild anemia- nothing treatable at this time  Thank you for this consult- as above if creatinine trends down tomorrow I would be OK with discharge   Amora Sheehy A 05/08/2015, 10:36 AM

## 2015-05-08 NOTE — Progress Notes (Signed)
Patient Name: Bill Walker Date of Encounter: 05/08/2015  Principal Problem:   NSTEMI (non-ST elevated myocardial infarction) Active Problems:   Essential hypertension   Ischemic cardiomyopathy   Chronic systolic CHF (congestive heart failure)   Coronary artery disease   Hyperlipidemia   Thrombocytopenia   CKD (chronic kidney disease), stage III   COPD (chronic obstructive pulmonary disease)   Unstable angina   COLD (chronic obstructive lung disease)    SUBJECTIVE  Denies chest pain, sob or palpitation. Wife concerns about his decrease appetite and "out of his mind this morning," now better. As per this wife this happened in past.   CURRENT MEDS . amLODipine  5 mg Oral Daily  . aspirin  81 mg Oral Daily  . calcium carbonate  1 tablet Oral Daily  . cholecalciferol  1,000 Units Oral Daily  . clopidogrel  75 mg Oral Daily  . heparin  5,000 Units Subcutaneous 3 times per day  . isosorbide mononitrate  60 mg Oral Daily  . metoprolol succinate  125 mg Oral Daily  . ranolazine  500 mg Oral BID  . simvastatin  20 mg Oral QHS  . sodium chloride  3 mL Intravenous Q12H  . sodium chloride  3 mL Intravenous Q12H  . tamsulosin  0.4 mg Oral Daily  . tiotropium  18 mcg Inhalation Daily  . vitamin B-12  1,000 mcg Oral Daily    OBJECTIVE  Filed Vitals:   05/07/15 0943 05/07/15 1407 05/07/15 2052 05/08/15 0500  BP:  115/56 124/61 136/62  Pulse: 90 79 83   Temp:  98 F (36.7 C) 97.7 F (36.5 C) 98.1 F (36.7 C)  TempSrc:  Oral Oral Oral  Resp:    18  Height:      Weight:    204 lb 1.6 oz (92.579 kg)  SpO2:  97% 100% 94%    Intake/Output Summary (Last 24 hours) at 05/08/15 0858 Last data filed at 05/08/15 0500  Gross per 24 hour  Intake    480 ml  Output    475 ml  Net      5 ml   Filed Weights   05/06/15 0517 05/07/15 0500 05/08/15 0500  Weight: 205 lb 6.4 oz (93.169 kg) 198 lb (89.812 kg) 204 lb 1.6 oz (92.579 kg)    PHYSICAL EXAM  General: Pleasant,  comfortable laying in bed, NAD. Neuro: Alert and oriented X 3. Moves all extremities spontaneously. Psych: Normal affect, responds appropriately HEENT:  Normal  Neck: Supple without bruits or JVD. Lungs:  Resp regular and unlabored. Faint crackle bibasilar L>R. Scattered wheezing throughout.  Heart: RRR no s3, s4, or murmurs. Abdomen: Soft, non-tender, non-distended, BS + x 4.  Extremities: No clubbing, cyanosis or edema. DP/PT/Radials 2+ and equal bilaterally.  Accessory Clinical Findings  CBC  Recent Labs  05/06/15 1142  WBC 8.6  HGB 10.7*  HCT 31.5*  MCV 100.0  PLT 68*   Basic Metabolic Panel  Recent Labs  05/07/15 0442 05/08/15 0729  NA 136 133*  K 4.2 4.3  CL 90* 91*  CO2 34* 36*  GLUCOSE 111* 119*  BUN 56* 53*  CREATININE 2.16* 2.06*  CALCIUM 8.7* 8.3*    TELE  NSR at rate of 80-90s.   Radiology/Studies  Dg Chest 2 View  05/03/2015   CLINICAL DATA:  Sudden onset of substernal chest pain radiating to the back  EXAM: CHEST  2 VIEW  COMPARISON:  12/29/2010  FINDINGS: Chronic cardiomegaly.  Aortic and hilar contours  are stable.  Small pleural effusions. There is a retrocardiac rounded opacity which correlates with probable rounded atelectasis on chest CT from 2010. No evidence of edema or pneumonia.  IMPRESSION: 1. Cardiomegaly with small pleural effusions but no pulmonary edema. 2. Left lower lobe scarring, present since at least 2010.   Electronically Signed   By: Marnee Spring M.D.   On: 05/03/2015 15:44   Ct Head Wo Contrast  05/05/2015   CLINICAL DATA:  Fall, hit head.  On aspirin and Plavix.  EXAM: CT HEAD WITHOUT CONTRAST  TECHNIQUE: Contiguous axial images were obtained from the base of the skull through the vertex without intravenous contrast.  COMPARISON:  03/10/2005  FINDINGS: Diffuse cerebral atrophy. No acute intracranial abnormality. Specifically, no hemorrhage, hydrocephalus, mass lesion, acute infarction, or significant intracranial injury. No  acute calvarial abnormality. Visualized paranasal sinuses and mastoids clear. Orbital soft tissues unremarkable.  IMPRESSION: Atrophy.  No acute intracranial abnormality.   Electronically Signed   By: Charlett Nose M.D.   On: 05/05/2015 07:29   Nm Myocar Single W/spect W/wall Motion And Ef  05/04/2015   There was a large, severe defect at rest as described below.  Stress  images were not done.  The patient was sent to the cath lab.    ASSESSMENT AND PLAN   1. NSTEMI/CAD: - Cath showed significant coronary calcification with three-vessel coronary obstructive disease; severe LAD disease with collateralization of the distal LAD, both via the distal circumflex marginal branch and distal RCA.The LAD stenoses are not significantly changed from 2011.The RCA stenoses have progressed since 2011 with several segmental 70% lesions.  - Recommended increased medical therapy with addition of low -dose ranolazine; further evaluation of aortic aneurysm, felt non operative candidate in past. Unable to get Chest/ABD CT at present due to increased creatinine. May need to evaluate as outpt. - If he has further CP may need to consider PCI. Currently asymptomatic.  - Continue ASA, Norvasc, Plavix, Imdur, Ranexa, Statin, Toprol Xl - HR improved after increasing BB to .   2. Essential HTN: - Stable and well controlled.   3. HL:  - LDL 38 - cont statin.  4. ICM:  - Last documented EF - 32% by remote nuc study.Faint bibasilar crackles. Cont bb. ARB on hold due to renal insuff.  5. Acute on CKD, stage III:  - Creatinine bumped post cath to 2.16, today 2.06  with increased CO2 and BUN. ARB and diuretics on hold. LVEDP was 25 at cath and he has a few crackles at lung bases. Pending renal  recommendations.   6. Chronic thrombocytopenia:  - Plt 91K on admission - on chronic asa/plavix.    7. Fall  - Fall after tripping the other night. CT of head without acute abnormality.  Recommended home health PT and rolling walker with 5" wheels.   8. Leukocytosis ? Etiology. Now resolved. Afebrile. Chest xray clear. UA negative for infection. There were moderate RBC's in urine.   9. COPD - Last dose of Spiriva 6/22. For the last two days it said "medication not available". Scattered wheezing on exam. I have spoken with pharmacist and they will administer medication today. Plan to get CXR if no improvement.   Dispo: Wife stated pt is "out of mind this morning" and this happened in past at home. He is Alert and oriented X 3 during my exam, followed commoned and responded appropriately. Will continue to monitor.    Signed,  Manson Passey PA-C Pager [phone]  I have  seen, examined the patient, and reviewed the above assessment and plan. On exam he is elderly and comfortable.  RRR.  decreased BS at the bases. Changes to above are made where necessary.  Given advanced age, his overall prognosis is poor.  Conservative measures are appropriate.  Appreciate renal input.  If creatinine remains stable, anticipate discharge tomorrow.  Co Sign: Hillis Range, MD 05/08/2015 11:35 AM '

## 2015-05-09 ENCOUNTER — Inpatient Hospital Stay (HOSPITAL_COMMUNITY): Payer: Medicare Other

## 2015-05-09 ENCOUNTER — Encounter (HOSPITAL_COMMUNITY): Payer: Self-pay | Admitting: Nurse Practitioner

## 2015-05-09 DIAGNOSIS — N141 Nephropathy induced by other drugs, medicaments and biological substances: Secondary | ICD-10-CM

## 2015-05-09 DIAGNOSIS — D696 Thrombocytopenia, unspecified: Secondary | ICD-10-CM

## 2015-05-09 DIAGNOSIS — T508X5A Adverse effect of diagnostic agents, initial encounter: Secondary | ICD-10-CM

## 2015-05-09 DIAGNOSIS — J449 Chronic obstructive pulmonary disease, unspecified: Secondary | ICD-10-CM

## 2015-05-09 DIAGNOSIS — N179 Acute kidney failure, unspecified: Secondary | ICD-10-CM

## 2015-05-09 DIAGNOSIS — N189 Chronic kidney disease, unspecified: Secondary | ICD-10-CM

## 2015-05-09 DIAGNOSIS — N289 Disorder of kidney and ureter, unspecified: Secondary | ICD-10-CM

## 2015-05-09 LAB — RENAL FUNCTION PANEL
Albumin: 2.2 g/dL — ABNORMAL LOW (ref 3.5–5.0)
Anion gap: 5 (ref 5–15)
BUN: 52 mg/dL — AB (ref 6–20)
CALCIUM: 8.3 mg/dL — AB (ref 8.9–10.3)
CO2: 37 mmol/L — AB (ref 22–32)
Chloride: 93 mmol/L — ABNORMAL LOW (ref 101–111)
Creatinine, Ser: 1.85 mg/dL — ABNORMAL HIGH (ref 0.61–1.24)
GFR calc Af Amer: 37 mL/min — ABNORMAL LOW (ref >60)
GFR calc non Af Amer: 32 mL/min — ABNORMAL LOW (ref >60)
Glucose, Bld: 115 mg/dL — ABNORMAL HIGH (ref 65–99)
POTASSIUM: 4.1 mmol/L (ref 3.5–5.1)
Phosphorus: 3.4 mg/dL (ref 2.5–4.6)
SODIUM: 135 mmol/L (ref 135–145)

## 2015-05-09 MED ORDER — RANOLAZINE ER 500 MG PO TB12: 500.0000 mg | ORAL_TABLET | Freq: Two times a day (BID) | ORAL | Status: DC

## 2015-05-09 MED ORDER — AMLODIPINE BESYLATE 5 MG PO TABS: 5.0000 mg | ORAL_TABLET | Freq: Every day | ORAL | Status: DC

## 2015-05-09 MED ORDER — METOPROLOL SUCCINATE ER 25 MG PO TB24: 25.0000 mg | ORAL_TABLET | Freq: Every day | ORAL | Status: DC

## 2015-05-09 MED ORDER — FUROSEMIDE 40 MG PO TABS: 40.0000 mg | ORAL_TABLET | ORAL | Status: DC

## 2015-05-09 MED ORDER — FUROSEMIDE 80 MG PO TABS
80.0000 mg | ORAL_TABLET | Freq: Every day | ORAL | Status: DC
  Administered 2015-05-09: 80 mg via ORAL
  Filled 2015-05-09: qty 1

## 2015-05-09 MED ORDER — ASPIRIN 81 MG PO TABS: 81.0000 mg | ORAL_TABLET | Freq: Every day | ORAL | Status: DC

## 2015-05-09 NOTE — Discharge Instructions (Signed)
**  PLEASE REMEMBER TO BRING ALL OF YOUR MEDICATIONS TO EACH OF YOUR FOLLOW-UP OFFICE VISITS. ° °NO HEAVY LIFTING X 2 WEEKS. °NO SEXUAL ACTIVITY X 2 WEEKS. °NO DRIVING X 1 WEEK. °NO SOAKING BATHS, HOT TUBS, POOLS, ETC., X 7 DAYS. ° °Groin Site Care °Refer to this sheet in the next few weeks. These instructions provide you with information on caring for yourself after your procedure. Your caregiver may also give you more specific instructions. Your treatment has been planned according to current medical practices, but problems sometimes occur. Call your caregiver if you have any problems or questions after your procedure. °HOME CARE INSTRUCTIONS °· You may shower 24 hours after the procedure. Remove the bandage (dressing) and gently wash the site with plain soap and water. Gently pat the site dry.  °· Do not apply powder or lotion to the site.  °· Do not sit in a bathtub, swimming pool, or whirlpool for 5 to 7 days.  °· No bending, squatting, or lifting anything over 10 pounds (4.5 kg) as directed by your caregiver.  °· Inspect the site at least twice daily.  °· Do not drive home if you are discharged the same day of the procedure. Have someone else drive you.  ° °What to expect: °· Any bruising will usually fade within 1 to 2 weeks.  °· Blood that collects in the tissue (hematoma) may be painful to the touch. It should usually decrease in size and tenderness within 1 to 2 weeks.  °SEEK IMMEDIATE MEDICAL CARE IF: °· You have unusual pain at the groin site or down the affected leg.  °· You have redness, warmth, swelling, or pain at the groin site.  °· You have drainage (other than a small amount of blood on the dressing).  °· You have chills.  °· You have a fever or persistent symptoms for more than 72 hours.  °· You have a fever and your symptoms suddenly get worse.  °· Your leg becomes pale, cool, tingly, or numb.  °You have heavy bleeding from the site. Hold pressure on the site. . °  °

## 2015-05-09 NOTE — Progress Notes (Signed)
Subjective: No chest pain no SOB increased cough  Objective: Vital signs in last 24 hours: Temp:  [97.8 F (36.6 C)-98.5 F (36.9 C)] 97.9 F (36.6 C) (06/26 0500) Pulse Rate:  [74-88] 87 (06/26 0500) Resp:  [18] 18 (06/25 0959) BP: (110-130)/(53-89) 128/89 mmHg (06/26 0500) SpO2:  [90 %-98 %] 90 % (06/26 0500) Weight:  [204 lb 3.2 oz (92.625 kg)] 204 lb 3.2 oz (92.625 kg) (06/26 0500) Weight change: 1.6 oz (0.045 kg) Last BM Date: 05/07/15 Intake/Output from previous day:-175 06/25 0701 - 06/26 0700 In: 600 [P.O.:600] Out: 775 [Urine:775] Intake/Output this shift:    PE: General:Pleasant affect, NAD Skin:Warm and dry, brisk capillary refill HEENT:normocephalic, sclera clear, mucus membranes moist Heart:S1S2 RRR without murmur, gallup, rub or click Lungs: with crackles and rhonchi 1/3 up, or wheezes CBJ:SEGB, non tender, + BS, do not palpate liver spleen or masses Ext:no lower ext edema, 2+ pedal pulses, 2+ radial pulses Neuro:alert and oriented X 3, MAE, follows commands, + facial symmetry Tele:SR   Lab Results:  Recent Labs  05/06/15 1142  WBC 8.6  HGB 10.7*  HCT 31.5*  PLT 68*   BMET  Recent Labs  05/08/15 0729 05/09/15 0350  NA 133* 135  K 4.3 4.1  CL 91* 93*  CO2 36* 37*  GLUCOSE 119* 115*  BUN 53* 52*  CREATININE 2.06* 1.85*  CALCIUM 8.3* 8.3*   No results for input(s): TROPONINI in the last 72 hours.  Invalid input(s): CK, MB  Lab Results  Component Value Date   CHOL 98 05/04/2015   HDL 51 05/04/2015   LDLCALC 38 05/04/2015   TRIG 45 05/04/2015   CHOLHDL 1.9 05/04/2015   No results found for: HGBA1C   Lab Results  Component Value Date   TSH 2.17 04/27/2015    Hepatic Function Panel  Recent Labs  05/09/15 0350  ALBUMIN 2.2*   No results for input(s): CHOL in the last 72 hours. No results for input(s): PROTIME in the last 72 hours.     Studies/Results: No results found.  Medications: I have reviewed the  patient's current medications. Scheduled Meds: . amLODipine  5 mg Oral Daily  . aspirin  81 mg Oral Daily  . calcium carbonate  1 tablet Oral Daily  . cholecalciferol  1,000 Units Oral Daily  . clopidogrel  75 mg Oral Daily  . heparin  5,000 Units Subcutaneous 3 times per day  . isosorbide mononitrate  60 mg Oral Daily  . metoprolol succinate  125 mg Oral Daily  . ranolazine  500 mg Oral BID  . simvastatin  20 mg Oral QHS  . sodium chloride  3 mL Intravenous Q12H  . tamsulosin  0.4 mg Oral Daily  . tiotropium  18 mcg Inhalation Daily  . vitamin B-12  1,000 mcg Oral Daily   Continuous Infusions:  PRN Meds:.sodium chloride, acetaminophen, nitroGLYCERIN, ondansetron (ZOFRAN) IV, sodium chloride, zolpidem  Assessment/Plan: Principal Problem:   NSTEMI (non-ST elevated myocardial infarction) Active Problems:   Essential hypertension   Ischemic cardiomyopathy   Chronic systolic CHF (congestive heart failure)   Coronary artery disease   Hyperlipidemia   Thrombocytopenia   CKD (chronic kidney disease), stage III   COPD (chronic obstructive pulmonary disease)   Unstable angina   COLD (chronic obstructive lung disease)   Acute on chronic renal insufficiency  1. NSTEMI/CAD: - Cath showed significant coronary calcification with three-vessel coronary obstructive disease; severe LAD disease with collateralization of the  distal LAD, both via the distal circumflex marginal branch and distal RCA.The LAD stenoses are not significantly changed from 2011.The RCA stenoses have progressed since 2011 with several segmental 70% lesions.  - Recommended increased medical therapy with addition of low -dose ranolazine; further evaluation of aortic aneurysm, felt non operative candidate in past. Unable to get Chest/ABD CT at present due to increased creatinine. May need to evaluate as outpt. - If he has further CP may need to consider PCI. Currently asymptomatic.  - Continue ASA, Norvasc,  Plavix, Imdur, Ranexa, Statin, Toprol Xl - HR improved after increasing BB to .  -ambulate Poss d/c  2. Essential HTN: - Stable and well controlled.   3. HL:  - LDL 38 - cont statin.  4. ICM:  - Last documented EF - 32% by remote nuc study.Faint bibasilar crackles. Cont bb. ARB on hold due to renal insuff.  5. Acute on CKD, stage III:  - Creatinine bumped post cath to 2.16,--> 2.06, -->1.85 today  with increased CO2 and BUN. ARB and diuretics on hold. LVEDP was 25 at cath and he has crackles at lung bases to 1/3 up and cough.  6. Chronic thrombocytopenia:  - Plt 91K on admission - on chronic asa/plavix. on the 23rd 66  7. Fall  - Fall after tripping the other night. CT of head without acute abnormality. Recommended home health PT and rolling walker with 5" wheels.   8. Leukocytosis ? Etiology. Now resolved. Afebrile. Chest xray clear. UA negative for infection. There were moderate RBC's in urine.   9. COPD - Last dose of Spiriva 6/22. For the last two days it said "medication not available". Scattered wheezing on exam. I have spoken with pharmacist and they will administer medication today. Plan to get CXR if no improvement. Check CXR-off lasix - see results    LOS: 5 days   Time spent with pt. : 15 minutes. [facility]  Nurse Practitioner Certified Pager [phone] or after 5pm and on weekends call [phone] 05/09/2015, 7:13 AM   I have seen, examined the patient, and reviewed the above assessment and plan.  On exam, he appears comfortable.  RRR.  Changes to above are made where necessary.   His long term prognosis is poor.  Wants to go home today.  Will arrange discharge with close outpatient follow-up.  Co Sign: Hillis Range, MD 05/09/2015 12:09 PM

## 2015-05-09 NOTE — Progress Notes (Signed)
Ambulated pt in hallway on 2L [place].  Pt denied SHOB and 02 sats remained around 90-95%.

## 2015-05-09 NOTE — Progress Notes (Signed)
Subjective:  Coughing according to the wife CXR shows pleural effusions Objective Vital signs in last 24 hours: Filed Vitals:   05/08/15 1057 05/08/15 1357 05/08/15 1957 05/09/15 0500  BP:  110/53 114/58 128/89  Pulse:  78 74 87  Temp:  98.5 F (36.9 C) 98.3 F (36.8 C) 97.9 F (36.6 C)  TempSrc:  Axillary Oral Oral  Resp:      Height:      Weight:    92.625 kg (204 lb 3.2 oz)  SpO2: 96% 98% 97% 90%   Weight change: 0.045 kg (1.6 oz)  Intake/Output Summary (Last 24 hours) at 05/09/15 0932 Last data filed at 05/09/15 0805  Gross per 24 hour  Intake   1080 ml  Output    975 ml  Net    105 ml   Assessment/Plan: 81 year old WM with stage 3 CKD at baseline- now s/p cardiac cath now 4 days ago- had progressive worsening of creatinine post cath but now maybe leveling off 1.Renal- A on CRF in the setting of being s/p cardiac cath with 81 year old kidneys and ARB in his system- I think these are the reasons that he may have had a delayed course of contrast nephropathy. Fortunately appears to be headed in the right direction now. I am not sure all of his urine is being recorded- I think  would be OK for discharge and to follow as OP. Patient sees Dr. Allena Katz at [facility] but only every 6 months- he can get labs thru his PCP to insure improvement. This may take a few weeks to get back to his baseline 2. Hypertension/volume - well controlled on norvasc /toprol and flomax. i would not restart arb at discharge- will go ahead and resume lasix at home dose of 80 mg daily in AM and 40 mg in the PM on MWF 3. Anemia - mild anemia- nothing treatable at this time  Renal will sign off- OK for discharge from our standpoint- would have PCP- Dr. Milinda Antis check chemistries late this week- as long as is under 2 I think OK to keep regularly scheduled appt with Dr. Allena Katz in a few months     Amal Renbarger A    Labs: Basic Metabolic Panel:  Recent Labs Lab 05/07/15 0442 05/08/15 0729 05/09/15 0350   NA 136 133* 135  K 4.2 4.3 4.1  CL 90* 91* 93*  CO2 34* 36* 37*  GLUCOSE 111* 119* 115*  BUN 56* 53* 52*  CREATININE 2.16* 2.06* 1.85*  CALCIUM 8.7* 8.3* 8.3*  PHOS  --   --  3.4   Liver Function Tests:  Recent Labs Lab 05/09/15 0350  ALBUMIN 2.2*   No results for input(s): LIPASE, AMYLASE in the last 168 hours. No results for input(s): AMMONIA in the last 168 hours. CBC:  Recent Labs Lab 05/03/15 1450 05/04/15 1135 05/05/15 0322 05/06/15 1142  WBC 6.0 23.7* 13.4* 8.6  HGB 11.0* 11.3* 10.6* 10.7*  HCT 32.1* 33.4* 31.3* 31.5*  MCV 100.6* 100.0 100.3* 100.0  PLT 91* 95* 93* 68*   Cardiac Enzymes:  Recent Labs Lab 05/03/15 1850 05/03/15 2357 05/04/15 0537  TROPONINI <0.03 0.03 0.11*   CBG: No results for input(s): GLUCAP in the last 168 hours.  Iron Studies: No results for input(s): IRON, TIBC, TRANSFERRIN, FERRITIN in the last 72 hours. Studies/Results: Dg Chest 2 View  05/09/2015   CLINICAL DATA:  Chest rales. History CHF, hypertension, CAD, ischemic cardiomyopathy, and pneumonia.  EXAM: CHEST  2 VIEW  COMPARISON:  05/03/2015.  FINDINGS: Heart is enlarged. There are bilateral pleural effusions. Persistent significant opacity in the left lower lobe consistent with infiltrate or atelectasis.  IMPRESSION: Persistent bilateral pleural effusions and left lower lobe infiltrate.   Electronically Signed   By: Norva Pavlov M.D.   On: 05/09/2015 09:11   Medications: Infusions:    Scheduled Medications: . amLODipine  5 mg Oral Daily  . aspirin  81 mg Oral Daily  . calcium carbonate  1 tablet Oral Daily  . cholecalciferol  1,000 Units Oral Daily  . clopidogrel  75 mg Oral Daily  . heparin  5,000 Units Subcutaneous 3 times per day  . isosorbide mononitrate  60 mg Oral Daily  . metoprolol succinate  125 mg Oral Daily  . ranolazine  500 mg Oral BID  . simvastatin  20 mg Oral QHS  . sodium chloride  3 mL Intravenous Q12H  . tamsulosin  0.4 mg Oral Daily  .  tiotropium  18 mcg Inhalation Daily  . vitamin B-12  1,000 mcg Oral Daily    have reviewed scheduled and prn medications.  Physical Exam: General: resting HOH- elderly-  Heart: RRR Lungs: decreased BS at bases Abdomen: soft, non tender Extremities: minimal edema    05/09/2015,9:32 AM  LOS: 5 days

## 2015-05-10 ENCOUNTER — Telehealth: Payer: Self-pay | Admitting: Nurse Practitioner

## 2015-05-10 NOTE — Telephone Encounter (Signed)
Patient contacted regarding discharge from  [facility] on 05/09/2015.  Patient understands to follow up with provider Ward Givens NP on 05/14/2015 at 1:30 pm at [address]. Patient understands discharge instructions? yes Patient understands medications and regiment? yes Patient understands to bring all medications to this visit? yes

## 2015-05-10 NOTE — Telephone Encounter (Signed)
7 TOC per Chris/staff messaage  7/1 @ 1:30 w/ Thayer Ohmhris

## 2015-05-11 ENCOUNTER — Telehealth: Payer: Self-pay | Admitting: Family Medicine

## 2015-05-11 NOTE — Telephone Encounter (Signed)
Lab appt cancelled and wife notified

## 2015-05-11 NOTE — Telephone Encounter (Signed)
Patient's wife called to schedule an appointment for patient for a follow up from the hospital.  He's coming in on 05/19/15. Patient was already scheduled for lab work on 05/14/15.  Does patient still need to have lab work done since he was in the hospital? If so, do you want it done the day of his appointment or before?

## 2015-05-11 NOTE — Telephone Encounter (Signed)
Can cancel the lab appt , thanks

## 2015-05-14 ENCOUNTER — Other Ambulatory Visit: Payer: Medicare Other

## 2015-05-14 ENCOUNTER — Encounter: Payer: Self-pay | Admitting: Nurse Practitioner

## 2015-05-14 ENCOUNTER — Ambulatory Visit (INDEPENDENT_AMBULATORY_CARE_PROVIDER_SITE_OTHER): Payer: Medicare Other | Admitting: Nurse Practitioner

## 2015-05-14 VITALS — BP 98/58 | HR 69 | Ht 71.0 in | Wt 203.2 lb

## 2015-05-14 DIAGNOSIS — I10 Essential (primary) hypertension: Secondary | ICD-10-CM

## 2015-05-14 DIAGNOSIS — T508X1A Poisoning by diagnostic agents, accidental (unintentional), initial encounter: Secondary | ICD-10-CM

## 2015-05-14 DIAGNOSIS — N183 Chronic kidney disease, stage 3 unspecified: Secondary | ICD-10-CM

## 2015-05-14 DIAGNOSIS — I5022 Chronic systolic (congestive) heart failure: Secondary | ICD-10-CM

## 2015-05-14 DIAGNOSIS — N141 Nephropathy induced by other drugs, medicaments and biological substances: Secondary | ICD-10-CM

## 2015-05-14 DIAGNOSIS — D696 Thrombocytopenia, unspecified: Secondary | ICD-10-CM

## 2015-05-14 DIAGNOSIS — E785 Hyperlipidemia, unspecified: Secondary | ICD-10-CM

## 2015-05-14 DIAGNOSIS — I222 Subsequent non-ST elevation (NSTEMI) myocardial infarction: Secondary | ICD-10-CM | POA: Diagnosis not present

## 2015-05-14 DIAGNOSIS — I214 Non-ST elevation (NSTEMI) myocardial infarction: Secondary | ICD-10-CM

## 2015-05-14 DIAGNOSIS — I2510 Atherosclerotic heart disease of native coronary artery without angina pectoris: Secondary | ICD-10-CM

## 2015-05-14 DIAGNOSIS — N1411 Contrast-induced nephropathy: Secondary | ICD-10-CM

## 2015-05-14 DIAGNOSIS — I255 Ischemic cardiomyopathy: Secondary | ICD-10-CM

## 2015-05-14 DIAGNOSIS — T508X5A Adverse effect of diagnostic agents, initial encounter: Secondary | ICD-10-CM

## 2015-05-14 LAB — CBC WITH DIFFERENTIAL/PLATELET
BASOS PCT: 0.3 % (ref 0.0–3.0)
Basophils Absolute: 0 10*3/uL (ref 0.0–0.1)
Eosinophils Absolute: 0 10*3/uL (ref 0.0–0.7)
Eosinophils Relative: 0.4 % (ref 0.0–5.0)
HEMATOCRIT: 30.6 % — AB (ref 39.0–52.0)
HEMOGLOBIN: 10.3 g/dL — AB (ref 13.0–17.0)
LYMPHS ABS: 0.7 10*3/uL (ref 0.7–4.0)
Lymphocytes Relative: 10.3 % — ABNORMAL LOW (ref 12.0–46.0)
MCHC: 33.7 g/dL (ref 30.0–36.0)
MCV: 99.9 fl (ref 78.0–100.0)
MONOS PCT: 6 % (ref 3.0–12.0)
Monocytes Absolute: 0.4 10*3/uL (ref 0.1–1.0)
Neutro Abs: 5.4 10*3/uL (ref 1.4–7.7)
Neutrophils Relative %: 83 % — ABNORMAL HIGH (ref 43.0–77.0)
PLATELETS: 174 10*3/uL (ref 150.0–400.0)
RBC: 3.07 Mil/uL — ABNORMAL LOW (ref 4.22–5.81)
RDW: 14.2 % (ref 11.5–15.5)
WBC: 6.5 10*3/uL (ref 4.0–10.5)

## 2015-05-14 LAB — BASIC METABOLIC PANEL
BUN: 37 mg/dL — ABNORMAL HIGH (ref 6–23)
CO2: 38 mEq/L — ABNORMAL HIGH (ref 19–32)
Calcium: 8.6 mg/dL (ref 8.4–10.5)
Chloride: 91 mEq/L — ABNORMAL LOW (ref 96–112)
Creatinine, Ser: 1.85 mg/dL — ABNORMAL HIGH (ref 0.40–1.50)
GFR: 37.07 mL/min — AB (ref >60.00)
GLUCOSE: 98 mg/dL (ref 70–99)
Potassium: 4.7 mEq/L (ref 3.5–5.1)
Sodium: 135 mEq/L (ref 135–145)

## 2015-05-14 NOTE — Patient Instructions (Addendum)
Medication Instructions:  Your physician recommends that you continue on your current medications as directed. Please refer to the Current Medication list given to you today.  Labwork: Your physician recommends that you have labs today BMET and CBC  Testing/Procedures: NONE  Follow-Up: Your physician recommends that you schedule a follow-up appointment in: 4 to 6 weeks with Dr. Izora GalaNishian.   Any Other Special Instructions Will Be Listed Below (If Applicable).

## 2015-05-14 NOTE — Progress Notes (Signed)
Patient Name: Bill Walker Date of Encounter: 05/14/2015  Primary Care Provider:  Roxy MannsMarne Tower, MD Primary Cardiologist:  P. Eden EmmsNishan, MD   Chief Complaint  81 year old male status post recent non-ST elevation MI who presents for follow-up.  Past Medical History   Past Medical History  Diagnosis Date  . Ischemic cardiomyopathy     a. 02/2005 EF 32% (myoview report).  . Hypertension   . Coronary artery disease     a. s/p prior MI w/ RCA stenting;  b. 03/2011 Cath: LM 20d, LAD 40p/100p, LCX 40p, L->L collats to distal LAD, RCA dominant, patent prox stent, 7336m ISR, 50-7624m, 50-60d, 60d-->Med Rx;  c. 04/2015 NSTEMI/Cath: LM 45, LAD 80/6136m, 100d (L->L collats), D2 99ost, OM1 60, RCA patent stent prox, 5824m, 70d, RPAV1/2 70->Med Rx.  Marland Kitchen. Hyperlipidemia   . Vitamin B12 deficiency   . History of pneumonia   . Thrombocytopenia   . Cor pulmonale   . Hearing loss   . Alcohol abuse, unspecified   . Tobacco use disorder   . CKD (chronic kidney disease), stage III   . Osteoarthritis   . Gout   . Diverticulosis of colon   . COPD (chronic obstructive pulmonary disease)   . Anemia   . Bronchiectasis     CT Chest 07/07/09 bilateral lower lobe medial bronchiectasis  . Chronic systolic CHF (congestive heart failure)     a. 02/2005 EF 32% (myoview report).   Past Surgical History  Procedure Laterality Date  . Cardiac catheterization      (catheterization 2006 demonstrated an   occluded LAD.  There is a proximal stent in the RCA which was   patent.  He had distal 75% and 95% RCA lesions.  Initially it was  thought that he would have PCI but he was managed medically).  . Mri      Lumbar disk disease 06/1999  . Esophagogastroduodenoscopy      gastritis 12/1999  . Cardiac catheterization N/A 05/04/2015    Procedure: Left Heart Cath and Coronary Angiography;  Surgeon: Lennette Biharihomas A Kelly, MD; LAD under percent, D2 99%, CFX patent, OM1 60% RCA 70%, previous stent is patent, no LV gram, 100 mL dye used     Allergies  Allergies  Allergen Reactions  . Doxazosin Mesylate Rash    rash  . Penicillins Other (See Comments)    REACTION: unknown reaction    HPI  81 year old male with the above complex problem list. He has a history of coronary artery disease status post right coronary artery stenting. He also has a history of ischemic retinopathy with an EF of 32% in 2006. He was recently hospitalized at [facility] after presenting with chest pain and ruling in with a mild troponin elevation. He was initially planned to undergo stress testing but this was canceled in the setting of ongoing chest pain and troponin elevation. Catheterization was performed and revealed severe mid and distal LAD disease as well as branch vessel disease involving the second diagonal and obtuse marginal. Previously placed right coronary artery stent was patent and he had moderate nonobstructive distal RCA disease. Given advanced age and comorbidities, decision was made to medically manage him. His nitrate dose was titrated and Plavix was added. Post catheterization, he did have mild contrast-induced nephropathy and his ARB was held. He was seen by nephrology and creatinine and did trend down prior to discharge.  Since discharge, he has done reasonably well. He is chronically on home O2 and is not particularly active. He  has not had any recurrence of chest pain or dyspnea. His weight is stable compared to hospitalization and he has not been having any PND, orthopnea, dizziness, syncope, edema, or early satiety.  Home Medications  Prior to Admission medications   Medication Sig Start Date End Date Taking? Authorizing Provider  amLODipine (NORVASC) 5 MG tablet Take 1 tablet (5 mg total) by mouth daily. 05/09/15  Yes Ok Anis, NP  aspirin 81 MG tablet Take 1 tablet (81 mg total) by mouth daily. 05/09/15  Yes Ok Anis, NP  calcium carbonate (TUMS - DOSED IN MG ELEMENTAL CALCIUM) 500 MG chewable tablet Chew  1 tablet by mouth daily.   Yes Historical Provider, MD  cholecalciferol (VITAMIN D) 1000 UNITS tablet Take 1 tablet (1,000 Units total) by mouth daily. 04/01/13  Yes Wendall Stade, MD  clopidogrel (PLAVIX) 75 MG tablet TAKE ONE TABLET BY MOUTH ONCE DAILY 03/29/15  Yes Judy Pimple, MD  diphenhydrAMINE (SOMINEX) 25 MG tablet Take 25 mg by mouth at bedtime as needed for sleep.   Yes Historical Provider, MD  furosemide (LASIX) 80 MG tablet Take 1 tablet (80 mg total) by mouth daily. TAKE 1 TABLET EVERY MORNING AND TAKE 1/2 TABLET IN THE EVENING ON DAYS MONDAY,    WEDNESDAY, AND FRIDAY 02/19/15  Yes Judy Pimple, MD  isosorbide mononitrate (IMDUR) 30 MG 24 hr tablet Take 2 tablets (60 mg total) by mouth daily. 03/11/15  Yes Judy Pimple, MD  metoprolol succinate (TOPROL-XL) 100 MG 24 hr tablet TAKE 1 TABLET BY MOUTH EVERY DAY WITH A MEAL 12/25/14  Yes Judy Pimple, MD  metoprolol succinate (TOPROL-XL) 25 MG 24 hr tablet Take 1 tablet (25 mg total) by mouth daily. Take with or immediately following a meal. Take with 100 mg tablet. 05/09/15  Yes Ok Anis, NP  nitroGLYCERIN (NITROSTAT) 0.4 MG SL tablet Place 1 tablet (0.4 mg total) under the tongue every 5 (five) minutes as needed for chest pain. 04/05/15  Yes Wendall Stade, MD  NON FORMULARY Oxygen- 2 liters every 24 hours    Yes Historical Provider, MD  ranolazine (RANEXA) 500 MG 12 hr tablet Take 1 tablet (500 mg total) by mouth 2 (two) times daily. 05/09/15  Yes Ok Anis, NP  simvastatin (ZOCOR) 20 MG tablet TAKE ONE TABLET BY MOUTH AT BEDTIME 03/29/15  Yes Judy Pimple, MD  tamsulosin (FLOMAX) 0.4 MG CAPS capsule Take 1 capsule (0.4 mg total) by mouth daily. 04/27/15  Yes Judy Pimple, MD  tiotropium (SPIRIVA HANDIHALER) 18 MCG inhalation capsule INHALE ONE CAPSULE BY MOUTH ONCE DAILY 02/08/15  Yes Judy Pimple, MD  vitamin B-12 (CYANOCOBALAMIN) 1000 MCG tablet Take 1,000 mcg by mouth daily.   Yes Historical Provider, MD    Review  of Systems  As above, he has been doing reasonably well. He feels that he is at his baseline. He has not been having any chest pain, palpitations, PND, orthopnea, dizziness, syncope, edema, or early satiety. He has chronic dyspnea on exertion and uses home O2. All other systems reviewed and are otherwise negative except as noted above.  Physical Exam  VS:  BP 98/58 mmHg  Pulse 69  Ht  (1.803 m)  Wt 203 lb 3.2 oz (92.171 kg)  BMI 28.35 kg/m2 , BMI Body mass index is 28.35 kg/(m^2). GEN: Well nourished, well developed, in no acute distress. HEENT: normal. Neck: Supple, no JVD, carotid bruits, or masses. Cardiac: RRR, no  murmurs, rubs, or gallops. No clubbing, cyanosis, edema.  Radials/DP/PT 1+ and equal bilaterally. Right groin catheterization site is without bleeding, bruit, or hematoma. Respiratory:  Respirations regular and unlabored, diminished breath sounds bilaterally. GI: Soft, nontender, nondistended, BS + x 4. MS: no deformity or atrophy. Skin: warm and dry, no rash. Neuro:  Strength and sensation are intact. Psych: Normal affect.  Accessory Clinical Findings  ECG - regular sinus rhythm, 69, poor R-wave progression, very mild inferolateral ST segment elevation which was present on hospital ECG cyst well.  Assessment & Plan  1.  Non-ST segment elevation myocardial infarction, subsequent episode of care/coronary artery disease: Status post recent hospitalization and catheterization revealing moderate to severe multivessel obstructive coronary artery disease. Given advanced age and comorbidities, medical therapy has been instituted and he has done well so far without recurrence of chest pain. He does have chronic dyspnea on exertion in the setting of COPD requiring home O2. He remains on aspirin, Plavix, nitrate, beta blocker, Ranexa, and statin therapy. I will not make any changes to his regimen today.  2. Ischemic cardiopathy/chronic systolic congestive heart failure: Prior  history of an EF of 32%. EF was not evaluated during this most recent hospitalization and he is scheduled for an echocardiogram on July 5. Volume status is stable and he remains on beta blocker and nitrate therapy. He was previously taking an ARB however this is on hold in the setting of acute on chronic renal failure and contrast-induced nephropathy following catheterization.  3. Acute on chronic stage III kidney disease/contrast-induced nephropathy: Follow-up basic metabolic panel today. ARB therapy is on hold. He remains on Lasix as directed by nephrology at discharge from the hospital.  4. Essential hypertension: Stable. Continue current regimen.  5. Hyperlipidemia: Continue statin therapy. LDL was 38 with normal LFTs during recent hospitalization.  6. Thrombocytopenia: Follow-up CBC today in the setting of ongoing aspirin and Plavix therapy following acute coronary syndrome a week ago.  7. COPD: On home O2. No active wheezing.  8. Disposition: Follow-up with Dr. Eden Emms in 6-8 wks or sooner if necessary.   Nicolasa Ducking, NP 05/14/2015, 2:04 PM

## 2015-05-18 ENCOUNTER — Other Ambulatory Visit: Payer: Self-pay

## 2015-05-18 ENCOUNTER — Ambulatory Visit (HOSPITAL_COMMUNITY): Payer: Medicare Other | Attending: Cardiology

## 2015-05-18 DIAGNOSIS — I214 Non-ST elevation (NSTEMI) myocardial infarction: Secondary | ICD-10-CM

## 2015-05-18 DIAGNOSIS — I059 Rheumatic mitral valve disease, unspecified: Secondary | ICD-10-CM | POA: Diagnosis not present

## 2015-05-18 DIAGNOSIS — R079 Chest pain, unspecified: Secondary | ICD-10-CM | POA: Diagnosis present

## 2015-05-18 DIAGNOSIS — I517 Cardiomegaly: Secondary | ICD-10-CM | POA: Diagnosis not present

## 2015-05-18 DIAGNOSIS — I340 Nonrheumatic mitral (valve) insufficiency: Secondary | ICD-10-CM | POA: Diagnosis not present

## 2015-05-19 ENCOUNTER — Telehealth: Payer: Self-pay | Admitting: *Deleted

## 2015-05-19 ENCOUNTER — Encounter: Payer: Self-pay | Admitting: Family Medicine

## 2015-05-19 ENCOUNTER — Ambulatory Visit (INDEPENDENT_AMBULATORY_CARE_PROVIDER_SITE_OTHER): Payer: Medicare Other | Admitting: Family Medicine

## 2015-05-19 VITALS — BP 126/80 | HR 84 | Temp 97.6°F | Ht 71.0 in | Wt 201.8 lb

## 2015-05-19 DIAGNOSIS — D696 Thrombocytopenia, unspecified: Secondary | ICD-10-CM

## 2015-05-19 DIAGNOSIS — N289 Disorder of kidney and ureter, unspecified: Secondary | ICD-10-CM | POA: Diagnosis not present

## 2015-05-19 DIAGNOSIS — I200 Unstable angina: Secondary | ICD-10-CM | POA: Diagnosis not present

## 2015-05-19 DIAGNOSIS — T508X1A Poisoning by diagnostic agents, accidental (unintentional), initial encounter: Secondary | ICD-10-CM | POA: Diagnosis not present

## 2015-05-19 DIAGNOSIS — N141 Nephropathy induced by other drugs, medicaments and biological substances: Secondary | ICD-10-CM

## 2015-05-19 DIAGNOSIS — E785 Hyperlipidemia, unspecified: Secondary | ICD-10-CM

## 2015-05-19 DIAGNOSIS — I10 Essential (primary) hypertension: Secondary | ICD-10-CM | POA: Diagnosis not present

## 2015-05-19 DIAGNOSIS — T508X5A Adverse effect of diagnostic agents, initial encounter: Secondary | ICD-10-CM

## 2015-05-19 DIAGNOSIS — N1411 Contrast-induced nephropathy: Secondary | ICD-10-CM

## 2015-05-19 NOTE — Telephone Encounter (Signed)
[place]

## 2015-05-19 NOTE — Progress Notes (Signed)
Pre visit review using our clinic review tool, if applicable. No additional management support is needed unless otherwise documented below in the visit note. 

## 2015-05-19 NOTE — Telephone Encounter (Signed)
FYI: [facility] came to eval pt today after being d/c from hospital. He declined getting nursing services from them but okayed having PT with them. They just wanted Dr. Milinda Antisower to know as an [place]

## 2015-05-19 NOTE — Progress Notes (Signed)
Subjective:    Patient ID: Bill Walker, male    DOB: March 04, 1928, 81 y.o.   MRN: [id number]  HPI Here for f/u of hosp 6/20-6/26/16 for cp and NSTEMI  Had cath showing severe 3V disease - cardiology tx medically  No more chest pain   cxr stable CT head neg after a fall in the hospital- late at night w/o bed alarm on   (no more falls since then per his wife-is using walker) No headaches - feels fine   Had contrast nephropathy after cath-this improved/back to baseline (kept him in the hospital 2 more days)    Chemistry      Component Value Date/Time   NA 135 05/14/2015 1411   NA 145 12/16/2009 1105   K 4.7 05/14/2015 1411   K 4.2 12/16/2009 1105   CL 91* 05/14/2015 1411   CL 102 12/16/2009 1105   CO2 38* 05/14/2015 1411   CO2 35* 12/16/2009 1105   BUN 37* 05/14/2015 1411   BUN 27* 12/16/2009 1105   CREATININE 1.85* 05/14/2015 1411   CREATININE 1.7* 12/16/2009 1105      Component Value Date/Time   CALCIUM 8.6 05/14/2015 1411   CALCIUM 8.5 12/16/2009 1105   ALKPHOS 43 04/27/2015 1001   ALKPHOS 61 03/17/2009 1301   AST 16 04/27/2015 1001   AST 21 03/17/2009 1301   ALT 7 04/27/2015 1001   ALT 9* 03/17/2009 1301   BILITOT 0.8 04/27/2015 1001   BILITOT 0.80 03/17/2009 1301      ARB was stopped  Put on norvasc  Sees renal every 6 months   BP Readings from Last 3 Encounters:  05/19/15 126/80  05/14/15 98/58  05/09/15 128/89    Had enc f/u with cardiol on 7/1 plavix was added in the hospital   Thrombocytopenia improved at f/u as well Lab Results  Component Value Date   WBC 6.5 05/14/2015   HGB 10.3* 05/14/2015   HCT 30.6* 05/14/2015   MCV 99.9 05/14/2015   PLT 174.0 05/14/2015     D/c home with PT and rolling walker  Feels just about back to normal - still a little tired  PT is coming out today  Nursing was going to come out- wife did not think he needed it  Does ok with ADLs right now- can bathe himself   Appetite is not good - but starting to eat  since he got home and drinking ensure  Wt is down 8 lb since going in to the hospital Some of it fluid  Some swelling in feet since hosp- poss due to norvasc   Lab Results  Component Value Date   VITAMINB12 1489* 04/27/2015  has cut back to dosing every other day   Lab Results  Component Value Date   CHOL 98 05/04/2015   HDL 51 05/04/2015   LDLCALC 38 05/04/2015   TRIG 45 05/04/2015   CHOLHDL 1.9 05/04/2015   zocor is working well  Diet is good   Patient Active Problem List   Diagnosis Date Noted  . Acute on chronic renal insufficiency 05/09/2015  . Contrast dye induced nephropathy 05/09/2015  . NSTEMI (non-ST elevated myocardial infarction) 05/04/2015  . COLD (chronic obstructive lung disease)   . Unstable angina 05/03/2015  . Ischemic cardiomyopathy   . Chronic systolic CHF (congestive heart failure)   . Coronary artery disease   . Hyperlipidemia   . Thrombocytopenia   . CKD (chronic kidney disease), stage III   . COPD (chronic obstructive pulmonary  disease)   . Cutaneous abscess of chest wall 01/26/2015  . Hematuria 05/12/2014  . Urinary frequency 03/25/2014  . Family history of prostate cancer 03/25/2014  . BPH (benign prostatic hyperplasia) 03/25/2014  . Chronic respiratory failure 05/14/2013  . PACER/LEAD MECH. COMPLICATION 07/11/2010  . B12 deficiency 07/29/2009  . Elevated lipids 07/29/2009  . Non-STEMI (non-ST elevated myocardial infarction) 07/29/2009  . CARDIOMYOPATHY 07/29/2009  . THROMBOCYTOPENIA 02/19/2009  . GOUT 10/21/2007  . ANEMIA-NOS 10/21/2007  . History of alcohol abuse 10/21/2007  . HEARING LOSS 10/21/2007  . Essential hypertension 10/21/2007  . Coronary atherosclerosis 10/21/2007  . COR PULMONALE 10/21/2007  . 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 DIVERTICULOSIS, COLON 10/21/2007  . Renal insufficiency 10/21/2007  . OSTEOARTHRITIS 10/21/2007   Past Medical History  Diagnosis Date  . Ischemic cardiomyopathy      a. 02/2005 EF 32% (myoview report).  . Hypertension   . Coronary artery disease     a. s/p prior MI w/ RCA stenting;  b. 03/2011 Cath: LM 20d, LAD 40p/100p, LCX 40p, L->L collats to distal LAD, RCA dominant, patent prox stent, 3081m ISR, 50-1132m, 50-60d, 60d-->Med Rx;  c. 04/2015 NSTEMI/Cath: LM 45, LAD 80/628m, 100d (L->L collats), D2 99ost, OM1 60, RCA patent stent prox, 10432m, 70d, RPAV1/2 70->Med Rx.  Marland Kitchen. Hyperlipidemia   . Vitamin B12 deficiency   . History of pneumonia   . Thrombocytopenia   . Cor pulmonale   . Hearing loss   . Alcohol abuse, unspecified   . Tobacco use disorder   . CKD (chronic kidney disease), stage III   . Osteoarthritis   . Gout   . Diverticulosis of colon   . COPD (chronic obstructive pulmonary disease)   . Anemia   . Bronchiectasis     CT Chest 07/07/09 bilateral lower lobe medial bronchiectasis  . Chronic systolic CHF (congestive heart failure)     a. 02/2005 EF 32% (myoview report).   Past Surgical History  Procedure Laterality Date  . Cardiac catheterization      (catheterization 2006 demonstrated an   occluded LAD.  There is a proximal stent in the RCA which was   patent.  He had distal 75% and 95% RCA lesions.  Initially it was  thought that he would have PCI but he was managed medically).  . Mri      Lumbar disk disease 06/1999  . Esophagogastroduodenoscopy      gastritis 12/1999  . Cardiac catheterization N/A 05/04/2015    Procedure: Left Heart Cath and Coronary Angiography;  Surgeon: Lennette Biharihomas A Kelly, MD; LAD under percent, D2 99%, CFX patent, OM1 60% RCA 70%, previous stent is patent, no LV gram, 100 mL dye used   History  Substance Use Topics  . Smoking status: Former Smoker -- 1.00 packs/day for 48 years    Quit date: 11/13/1996  . Smokeless tobacco: Not on file  . Alcohol Use: No   Family History  Problem Relation Age of Onset  . Lymphoma Brother   . Prostate cancer Brother    Allergies  Allergen Reactions  . Doxazosin Mesylate Rash    rash  .  Penicillins Other (See Comments)    REACTION: unknown reaction   Current Outpatient Prescriptions on File Prior to Visit  Medication Sig Dispense Refill  . amLODipine (NORVASC) 5 MG tablet Take 1 tablet (5 mg total) by mouth daily. 30 tablet 6  . aspirin 81 MG tablet Take 1 tablet (81 mg total) by mouth daily.    .Marland Kitchen  calcium carbonate (TUMS - DOSED IN MG ELEMENTAL CALCIUM) 500 MG chewable tablet Chew 1 tablet by mouth daily.    . cholecalciferol (VITAMIN D) 1000 UNITS tablet Take 1 tablet (1,000 Units total) by mouth daily.    . clopidogrel (PLAVIX) 75 MG tablet TAKE ONE TABLET BY MOUTH ONCE DAILY 90 tablet 0  . diphenhydrAMINE (SOMINEX) 25 MG tablet Take 50 mg by mouth at bedtime as needed for sleep.     . furosemide (LASIX) 80 MG tablet Take 1 tablet (80 mg total) by mouth daily. TAKE 1 TABLET EVERY MORNING AND TAKE 1/2 TABLET IN THE EVENING ON DAYS MONDAY,    WEDNESDAY, AND FRIDAY 144 tablet 3  . isosorbide mononitrate (IMDUR) 30 MG 24 hr tablet Take 2 tablets (60 mg total) by mouth daily. 180 tablet 0  . metoprolol succinate (TOPROL-XL) 100 MG 24 hr tablet TAKE 1 TABLET BY MOUTH EVERY DAY WITH A MEAL 90 tablet 1  . metoprolol succinate (TOPROL-XL) 25 MG 24 hr tablet Take 1 tablet (25 mg total) by mouth daily. Take with or immediately following a meal. Take with 100 mg tablet. 30 tablet 6  . nitroGLYCERIN (NITROSTAT) 0.4 MG SL tablet Place 1 tablet (0.4 mg total) under the tongue every 5 (five) minutes as needed for chest pain. 25 tablet 2  . NON FORMULARY Oxygen- 2 liters every 24 hours     . ranolazine (RANEXA) 500 MG 12 hr tablet Take 1 tablet (500 mg total) by mouth 2 (two) times daily. 60 tablet 6  . simvastatin (ZOCOR) 20 MG tablet TAKE ONE TABLET BY MOUTH AT BEDTIME 90 tablet 0  . tamsulosin (FLOMAX) 0.4 MG CAPS capsule Take 1 capsule (0.4 mg total) by mouth daily. 90 capsule 3  . tiotropium (SPIRIVA HANDIHALER) 18 MCG inhalation capsule INHALE ONE CAPSULE BY MOUTH ONCE DAILY 90 capsule  3  . vitamin B-12 (CYANOCOBALAMIN) 1000 MCG tablet Take 1,000 mcg by mouth daily.     No current facility-administered medications on file prior to visit.       Review of Systems Review of Systems  Constitutional: Negative for fever, appetite change,  and unexpected weight change. pos for fatigue that is improving  Eyes: Negative for pain and visual disturbance.  Respiratory: Negative for cough and pos for baseline  shortness of breath.  (back to baseline) Cardiovascular: Negative for cp or palpitations    Gastrointestinal: Negative for nausea, diarrhea and constipation.  Genitourinary: Negative for urgency and frequency.  Skin: Negative for pallor or rash   Neurological: Negative for weakness, light-headedness, numbness and headaches.  Hematological: Negative for adenopathy. Does not bruise/bleed easily.  Psychiatric/Behavioral: Negative for dysphoric mood. The patient is not nervous/anxious.         Objective:   Physical Exam  Constitutional: He appears well-developed and well-nourished. No distress.  Frail appearing elderly male with 24 via [place]   HENT:  Head: Normocephalic and atraumatic.  Mouth/Throat: Oropharynx is clear and moist.  Eyes: Conjunctivae and EOM are normal. Pupils are equal, round, and reactive to light.  Neck: Normal range of motion. Neck supple. No JVD present. Carotid bruit is not present. No thyromegaly present.  Cardiovascular: Normal rate, regular rhythm, normal heart sounds and intact distal pulses.  Exam reveals no gallop.   Pulmonary/Chest: Effort normal and breath sounds normal. No respiratory distress. He has no wheezes. He has no rales.  No crackles  Harsh bs that are distant   Abdominal: Soft. Bowel sounds are normal. He exhibits no  distension, no abdominal bruit and no mass. There is no tenderness.  Musculoskeletal: He exhibits edema.  Trace pedal edema to ankle that is pitting   Lymphadenopathy:    He has no cervical adenopathy.    Neurological: He is alert. He has normal reflexes.  Skin: Skin is warm and dry. No rash noted.  Psychiatric: He has a normal mood and affect.          Assessment & Plan:   Problem List Items Addressed This Visit    Contrast dye induced nephropathy    Resolved Rev noted  F/u renal as planned       Essential hypertension - Primary    bp in fair control at this time  BP Readings from Last 1 Encounters:  05/19/15 126/80   ARB stopped and amlodipine started in hospital  Some mild ankle edema is not too bothersome Will stick to this regimen and f/u with cardiol as planned  Disc lifstyle change with low sodium diet and exercise        Hyperlipidemia    In pt with 3V CAD Medical management Stable with low dose zocor  Disc goals for lipids and reasons to control them Rev labs with pt Rev low sat fat diet in detail       Renal insufficiency    Pt exp contrast nephropathy in the hospital  Now Cr is back to baseline  Lab Results  Component Value Date   CREATININE 1.85* 05/14/2015    Enc fluid intake unless restricted  Off ARB at this time Will f/u with renal       Thrombocytopenia    Lab Results  Component Value Date   PLT 174.0 05/14/2015   This is back to baseline

## 2015-05-19 NOTE — Patient Instructions (Signed)
Labs are stable from cardiology  Kidney function was stable  Platelet count is better  Blood pressure is also better   Do physical therapy as directed - if any problems please let me know   If any headaches or other problems let me know  On the blood thinner- he will bleed more easily - so be cautious   Make sure to keep up with fluids to make sure kidney function is preserved   Follow up with me in 3-6 months - but earlier if needed Get a flu shot in the fall

## 2015-05-20 NOTE — Assessment & Plan Note (Signed)
bp in fair control at this time  BP Readings from Last 1 Encounters:  05/19/15 126/80   ARB stopped and amlodipine started in hospital  Some mild ankle edema is not too bothersome Will stick to this regimen and f/u with cardiol as planned  Disc lifstyle change with low sodium diet and exercise

## 2015-05-20 NOTE — Assessment & Plan Note (Signed)
Pt exp contrast nephropathy in the hospital  Now Cr is back to baseline  Lab Results  Component Value Date   CREATININE 1.85* 05/14/2015    Enc fluid intake unless restricted  Off ARB at this time Will f/u with renal

## 2015-05-20 NOTE — Assessment & Plan Note (Signed)
Resolved Rev noted  F/u renal as planned

## 2015-05-20 NOTE — Assessment & Plan Note (Signed)
In pt with 3V CAD Medical management Stable with low dose zocor  Disc goals for lipids and reasons to control them Rev labs with pt Rev low sat fat diet in detail

## 2015-05-20 NOTE — Assessment & Plan Note (Signed)
Lab Results  Component Value Date   PLT 174.0 05/14/2015   This is back to baseline

## 2015-05-21 DIAGNOSIS — I5022 Chronic systolic (congestive) heart failure: Secondary | ICD-10-CM | POA: Diagnosis not present

## 2015-05-21 DIAGNOSIS — J449 Chronic obstructive pulmonary disease, unspecified: Secondary | ICD-10-CM | POA: Diagnosis not present

## 2015-05-21 DIAGNOSIS — I214 Non-ST elevation (NSTEMI) myocardial infarction: Secondary | ICD-10-CM | POA: Diagnosis not present

## 2015-05-25 DIAGNOSIS — I5022 Chronic systolic (congestive) heart failure: Secondary | ICD-10-CM

## 2015-05-25 DIAGNOSIS — I2510 Atherosclerotic heart disease of native coronary artery without angina pectoris: Secondary | ICD-10-CM | POA: Diagnosis not present

## 2015-05-25 DIAGNOSIS — I214 Non-ST elevation (NSTEMI) myocardial infarction: Secondary | ICD-10-CM | POA: Diagnosis not present

## 2015-05-25 DIAGNOSIS — M6281 Muscle weakness (generalized): Secondary | ICD-10-CM

## 2015-06-04 ENCOUNTER — Telehealth: Payer: Self-pay | Admitting: Family Medicine

## 2015-06-04 NOTE — Telephone Encounter (Signed)
Thanks for the update

## 2015-06-04 NOTE — Telephone Encounter (Signed)
Marylin, physical therapist from [facility] called wanting to inform you pt has been discharged from [facility] and no longer needs them to come in the home. She states he is still short of breath but can do the exercise by himself

## 2015-06-19 ENCOUNTER — Other Ambulatory Visit: Payer: Self-pay | Admitting: Family Medicine

## 2015-06-21 ENCOUNTER — Ambulatory Visit (INDEPENDENT_AMBULATORY_CARE_PROVIDER_SITE_OTHER): Payer: Medicare Other | Admitting: Family Medicine

## 2015-06-21 ENCOUNTER — Encounter: Payer: Self-pay | Admitting: Family Medicine

## 2015-06-21 VITALS — BP 120/66 | HR 86 | Temp 97.5°F | Ht 71.0 in | Wt 207.5 lb

## 2015-06-21 DIAGNOSIS — T148XXA Other injury of unspecified body region, initial encounter: Secondary | ICD-10-CM | POA: Insufficient documentation

## 2015-06-21 DIAGNOSIS — T148 Other injury of unspecified body region: Secondary | ICD-10-CM | POA: Diagnosis not present

## 2015-06-21 DIAGNOSIS — I200 Unstable angina: Secondary | ICD-10-CM | POA: Diagnosis not present

## 2015-06-21 DIAGNOSIS — S60211A Contusion of right wrist, initial encounter: Secondary | ICD-10-CM

## 2015-06-21 NOTE — Assessment & Plan Note (Signed)
Large hematoma on dorsal R wrist - that is soft and mildly tender with large extension of ecchymosis Pt on plavix  Unsure what trauma caused it , perfusion intact  Recommend elevation/ warm and cool compress Update if painful or any redness or warmth (suspect it will take a while to resolve)

## 2015-06-21 NOTE — Assessment & Plan Note (Signed)
Skin tears on back (3-4 cm linear), L elbow 3 cm V shaped, and L 5th finger (2 cm) all dressed with bacitracin ointment and non stick gauze/then wrapped in kerlex  Pressure applied ton elbow wound with ace bandage to stop bleeding  No signs of infection  Counseled on fall prev-need a stable bar mounted in bathroom  Disc wound dressing Update if any s/s of infection or if unable to stop bleeding

## 2015-06-21 NOTE — Progress Notes (Signed)
Pre visit review using our clinic review tool, if applicable. No additional management support is needed unless otherwise documented below in the visit note. 

## 2015-06-21 NOTE — Patient Instructions (Signed)
You have a hematoma (large bruise under the skin) on your wrist that caused extension of bruising  Keep arm elevated with warm or cool compress -- whichever feels better - it if does not start going down in the next week- let me know (it will be slow)  The skin tears look clean and uninfected  Keep them clean with soap and water-then antibiotic ointment/ non stick bandage and then gauze to hold it on (the oozing is from plavix)  Pulse ox was 88% with the oxygen-watch it at home  Get a support system for the bathroom - wall rail or walker to prevent falls   If any worsening symptoms let me know

## 2015-06-21 NOTE — Progress Notes (Signed)
Subjective:    Patient ID: Bill Walker, male    DOB: May 01, 1928, 81 y.o.   MRN: [id number]  HPI Here with a bruised arm    Also had 02 on 2L when he came in - pulse ox 78%   (usually has it on 3 )   Has a bruised and swollen arm on R side  Started last week Started with a lump on his arm - and then it went down and his arm bruised from elbow to part way down fingers  His finger tips feel numb   Of note- on plavix and also hx of low platelets Lab Results  Component Value Date   WBC 6.5 05/14/2015   HGB 10.3* 05/14/2015   HCT 30.6* 05/14/2015   MCV 99.9 05/14/2015   PLT 174.0 05/14/2015     Then had a fall at 3 am Sunday  In bathroom  Was holding on to towel rod that gave way and broke- he hit the floor  Abraded arm (L) and finger(L 5th) and back  His skin tears easily    Patient Active Problem List   Diagnosis Date Noted  . Traumatic hematoma of right wrist 06/21/2015  . Multiple skin tears 06/21/2015  . Acute on chronic renal insufficiency 05/09/2015  . Contrast dye induced nephropathy 05/09/2015  . NSTEMI (non-ST elevated myocardial infarction) 05/04/2015  . COLD (chronic obstructive lung disease)   . Unstable angina 05/03/2015  . Ischemic cardiomyopathy   . Chronic systolic CHF (congestive heart failure)   . Coronary artery disease   . Hyperlipidemia   . Thrombocytopenia   . CKD (chronic kidney disease), stage III   . COPD (chronic obstructive pulmonary disease)   . Cutaneous abscess of chest wall 01/26/2015  . Hematuria 05/12/2014  . Urinary frequency 03/25/2014  . Family history of prostate cancer 03/25/2014  . BPH (benign prostatic hyperplasia) 03/25/2014  . Chronic respiratory failure 05/14/2013  . PACER/LEAD MECH. COMPLICATION 07/11/2010  . B12 deficiency 07/29/2009  . Elevated lipids 07/29/2009  . Non-STEMI (non-ST elevated myocardial infarction) 07/29/2009  . CARDIOMYOPATHY 07/29/2009  . THROMBOCYTOPENIA 02/19/2009  . GOUT 10/21/2007  .  ANEMIA-NOS 10/21/2007  . History of alcohol abuse 10/21/2007  . HEARING LOSS 10/21/2007  . Essential hypertension 10/21/2007  . Coronary atherosclerosis 10/21/2007  . COR PULMONALE 10/21/2007  . 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 DIVERTICULOSIS, COLON 10/21/2007  . Renal insufficiency 10/21/2007  . OSTEOARTHRITIS 10/21/2007   Past Medical History  Diagnosis Date  . Ischemic cardiomyopathy     a. 02/2005 EF 32% (myoview report).  . Hypertension   . Coronary artery disease     a. s/p prior MI w/ RCA stenting;  b. 03/2011 Cath: LM 20d, LAD 40p/100p, LCX 40p, L->L collats to distal LAD, RCA dominant, patent prox stent, 40m ISR, 50-73m, 50-60d, 60d-->Med Rx;  c. 04/2015 NSTEMI/Cath: LM 45, LAD 80/1m, 100d (L->L collats), D2 99ost, OM1 60, RCA patent stent prox, 33m, 70d, RPAV1/2 70->Med Rx.  Marland Kitchen Hyperlipidemia   . Vitamin B12 deficiency   . History of pneumonia   . Thrombocytopenia   . Cor pulmonale   . Hearing loss   . Alcohol abuse, unspecified   . Tobacco use disorder   . CKD (chronic kidney disease), stage III   . Osteoarthritis   . Gout   . Diverticulosis of colon   . COPD (chronic obstructive pulmonary disease)   . Anemia   . Bronchiectasis     CT Chest  07/07/09 bilateral lower lobe medial bronchiectasis  . Chronic systolic CHF (congestive heart failure)     a. 02/2005 EF 32% (myoview report).   Past Surgical History  Procedure Laterality Date  . Cardiac catheterization      (catheterization 2006 demonstrated an   occluded LAD.  There is a proximal stent in the RCA which was   patent.  He had distal 75% and 95% RCA lesions.  Initially it was  thought that he would have PCI but he was managed medically).  . Mri      Lumbar disk disease 06/1999  . Esophagogastroduodenoscopy      gastritis 12/1999  . Cardiac catheterization N/A 05/04/2015    Procedure: Left Heart Cath and Coronary Angiography;  Surgeon: Lennette Bihari, MD; LAD under percent, D2 99%, CFX  patent, OM1 60% RCA 70%, previous stent is patent, no LV gram, 100 mL dye used   History  Substance Use Topics  . Smoking status: Former Smoker -- 1.00 packs/day for 48 years    Quit date: 11/13/1996  . Smokeless tobacco: Not on file  . Alcohol Use: No   Family History  Problem Relation Age of Onset  . Lymphoma Brother   . Prostate cancer Brother    Allergies  Allergen Reactions  . Doxazosin Mesylate Rash    rash  . Penicillins Other (See Comments)    REACTION: unknown reaction   Current Outpatient Prescriptions on File Prior to Visit  Medication Sig Dispense Refill  . amLODipine (NORVASC) 5 MG tablet Take 1 tablet (5 mg total) by mouth daily. 30 tablet 6  . aspirin 81 MG tablet Take 1 tablet (81 mg total) by mouth daily.    . calcium carbonate (TUMS - DOSED IN MG ELEMENTAL CALCIUM) 500 MG chewable tablet Chew 1 tablet by mouth daily.    . cholecalciferol (VITAMIN D) 1000 UNITS tablet Take 1 tablet (1,000 Units total) by mouth daily.    . clopidogrel (PLAVIX) 75 MG tablet TAKE ONE TABLET BY MOUTH ONCE DAILY 90 tablet 0  . diphenhydrAMINE (SOMINEX) 25 MG tablet Take 50 mg by mouth at bedtime as needed for sleep.     . furosemide (LASIX) 80 MG tablet Take 1 tablet (80 mg total) by mouth daily. TAKE 1 TABLET EVERY MORNING AND TAKE 1/2 TABLET IN THE EVENING ON DAYS MONDAY,    WEDNESDAY, AND FRIDAY 144 tablet 3  . metoprolol succinate (TOPROL-XL) 100 MG 24 hr tablet TAKE 1 TABLET BY MOUTH EVERY DAY WITH A MEAL 90 tablet 1  . metoprolol succinate (TOPROL-XL) 25 MG 24 hr tablet Take 1 tablet (25 mg total) by mouth daily. Take with or immediately following a meal. Take with 100 mg tablet. 30 tablet 6  . nitroGLYCERIN (NITROSTAT) 0.4 MG SL tablet Place 1 tablet (0.4 mg total) under the tongue every 5 (five) minutes as needed for chest pain. 25 tablet 2  . NON FORMULARY Oxygen- 2 liters every 24 hours     . ranolazine (RANEXA) 500 MG 12 hr tablet Take 1 tablet (500 mg total) by mouth 2 (two)  times daily. 60 tablet 6  . simvastatin (ZOCOR) 20 MG tablet TAKE ONE TABLET BY MOUTH AT BEDTIME 90 tablet 0  . tamsulosin (FLOMAX) 0.4 MG CAPS capsule Take 1 capsule (0.4 mg total) by mouth daily. 90 capsule 3  . tiotropium (SPIRIVA HANDIHALER) 18 MCG inhalation capsule INHALE ONE CAPSULE BY MOUTH ONCE DAILY 90 capsule 3  . vitamin B-12 (CYANOCOBALAMIN) 1000 MCG tablet  Take 1,000 mcg by mouth daily.    . isosorbide mononitrate (IMDUR) 30 MG 24 hr tablet TAKE TWO TABLETS BY MOUTH ONCE DAILY 180 tablet 1   No current facility-administered medications on file prior to visit.    Review of Systems Review of Systems  Constitutional: Negative for fever, appetite change, fatigue and unexpected weight change.  Eyes: Negative for pain and visual disturbance.  Respiratory: Negative for cough and shortness of breath.   Cardiovascular: Negative for cp or palpitations    Gastrointestinal: Negative for nausea, diarrhea and constipation.  Genitourinary: Negative for urgency and frequency.  Skin: Negative for pallor or rash  pos for extensive bruising of R arm and skin tears of L arm and back  Neurological: Negative for weakness, light-headedness, numbness and headaches. pos for tingling of fingers pos for a fall  Hematological: Negative for adenopathy. Does not bruise/bleed easily.  Psychiatric/Behavioral: Negative for dysphoric mood. The patient is not nervous/anxious.         Objective:   Physical Exam  Constitutional: He appears well-developed and well-nourished. No distress.  Frail appearing elderly male / mildly unsteady today   HENT:  Head: Normocephalic and atraumatic.  Eyes: Conjunctivae and EOM are normal. Pupils are equal, round, and reactive to light. No scleral icterus.  Neck: Normal range of motion. Neck supple.  Cardiovascular: Normal rate and regular rhythm.   Pulmonary/Chest: Effort normal and breath sounds normal.  Diffusely distant bs   Of note - pulse ox improved to 88% when  his 02 was turned up to baseline 3L   (this is what he uses at home)  Musculoskeletal: He exhibits tenderness. He exhibits no edema.  L arm- 2 by 3 cm mildly tender hematoma on dorsal R wrist with ecchymosis (some old) ext from elbow to proximal fingers No bony tenderness   Nl rom wrist/hand and elbow bilateral   Lymphadenopathy:    He has no cervical adenopathy.  Neurological: He is alert.  Skin: Skin is warm and dry. No rash noted. No pallor.  Skin tear 3 cm linear mid back (with some ecchymosis) V shaped on L elbow Small on L 5th finger  Oozing blood -controlled with pressure dressing on L elbow  No erythema or pus   Psychiatric: He has a normal mood and affect.          Assessment & Plan:   Problem List Items Addressed This Visit    Multiple skin tears    Skin tears on back (3-4 cm linear), L elbow 3 cm V shaped, and L 5th finger (2 cm) all dressed with bacitracin ointment and non stick gauze/then wrapped in kerlex  Pressure applied ton elbow wound with ace bandage to stop bleeding  No signs of infection  Counseled on fall prev-need a stable bar mounted in bathroom  Disc wound dressing Update if any s/s of infection or if unable to stop bleeding         Traumatic hematoma of right wrist - Primary    Large hematoma on dorsal R wrist - that is soft and mildly tender with large extension of ecchymosis Pt on plavix  Unsure what trauma caused it , perfusion intact  Recommend elevation/ warm and cool compress Update if painful or any redness or warmth (suspect it will take a while to resolve)

## 2015-07-02 ENCOUNTER — Other Ambulatory Visit: Payer: Self-pay | Admitting: Family Medicine

## 2015-07-13 NOTE — Progress Notes (Signed)
Patient ID: Tyhir Schwan, male   DOB: 1928/03/24, 81 y.o.   MRN: [id number]   Patient Name: Bill Walker Date of Encounter: 07/14/2015  Primary Care Provider:  Roxy Manns, MD Primary Cardiologist:  P. Eden Emms, MD   Chief Complaint  81 year old male status post recent non-ST elevation MI who presents for follow-up.  Past Medical History   Past Medical History  Diagnosis Date  . Ischemic cardiomyopathy     a. 02/2005 EF 32% (myoview report).  . Hypertension   . Coronary artery disease     a. s/p prior MI w/ RCA stenting;  b. 03/2011 Cath: LM 20d, LAD 40p/100p, LCX 40p, L->L collats to distal LAD, RCA dominant, patent prox stent, 30m ISR, 50-40m, 50-60d, 60d-->Med Rx;  c. 04/2015 NSTEMI/Cath: LM 45, LAD 80/13m, 100d (L->L collats), D2 99ost, OM1 60, RCA patent stent prox, 38m, 70d, RPAV1/2 70->Med Rx.  Marland Kitchen Hyperlipidemia   . Vitamin B12 deficiency   . History of pneumonia   . Thrombocytopenia   . Cor pulmonale   . Hearing loss   . Alcohol abuse, unspecified   . Tobacco use disorder   . CKD (chronic kidney disease), stage III   . Osteoarthritis   . Gout   . Diverticulosis of colon   . COPD (chronic obstructive pulmonary disease)   . Anemia   . Bronchiectasis     CT Chest 07/07/09 bilateral lower lobe medial bronchiectasis  . Chronic systolic CHF (congestive heart failure)     a. 02/2005 EF 32% (myoview report).   Past Surgical History  Procedure Laterality Date  . Cardiac catheterization      (catheterization 2006 demonstrated an   occluded LAD.  There is a proximal stent in the RCA which was   patent.  He had distal 75% and 95% RCA lesions.  Initially it was  thought that he would have PCI but he was managed medically).  . Mri      Lumbar disk disease 06/1999  . Esophagogastroduodenoscopy      gastritis 12/1999  . Cardiac catheterization N/A 05/04/2015    Procedure: Left Heart Cath and Coronary Angiography;  Surgeon: Lennette Bihari, MD; LAD under percent, D2 99%, CFX patent, OM1  60% RCA 70%, previous stent is patent, no LV gram, 100 mL dye used    Allergies  Allergies  Allergen Reactions  . Doxazosin Mesylate Rash    rash  . Penicillins Other (See Comments)    REACTION: unknown reaction    HPI  81 year old male with the above complex problem list. He has a history of coronary artery disease status post right coronary artery stenting. He also has a history of ischemic retinopathy with an EF of 32% in 2006. He was recently hospitalized at [facility] after presenting with chest pain and ruling in with a mild troponin elevation. He was initially planned to undergo stress testing but this was canceled in the setting of ongoing chest pain and troponin elevation. Catheterization was performed and revealed severe mid and distal LAD disease as well as branch vessel disease involving the second diagonal and obtuse marginal. Previously placed right coronary artery stent was patent and he had moderate nonobstructive distal RCA disease. Given advanced age and comorbidities, decision was made to medically manage him. His nitrate dose was titrated and Plavix was added. Post catheterization, he did have mild contrast-induced nephropathy and his ARB was held. He was seen by nephrology and creatinine and did trend down prior to discharge.  Since discharge, he  has done reasonably well. He is chronically on home O2 and is not particularly active. He has not had any recurrence of chest pain or dyspnea. His weight is stable compared to hospitalization and he has not been having any PND, orthopnea, dizziness, syncope, edema, or early satiety.  Echo 05/18/15 EF 35-40% mild MR estimated PA pressure 49 mmHg  Home Medications  Prior to Admission medications   Medication Sig Start Date End Date Taking? Authorizing Provider  amLODipine (NORVASC) 5 MG tablet Take 1 tablet (5 mg total) by mouth daily. 05/09/15  Yes Ok Anis, NP  aspirin 81 MG tablet Take 1 tablet (81 mg total) by mouth  daily. 05/09/15  Yes Ok Anis, NP  calcium carbonate (TUMS - DOSED IN MG ELEMENTAL CALCIUM) 500 MG chewable tablet Chew 1 tablet by mouth daily.   Yes Historical Provider, MD  cholecalciferol (VITAMIN D) 1000 UNITS tablet Take 1 tablet (1,000 Units total) by mouth daily. 04/01/13  Yes Wendall Stade, MD  clopidogrel (PLAVIX) 75 MG tablet TAKE ONE TABLET BY MOUTH ONCE DAILY 03/29/15  Yes Judy Pimple, MD  diphenhydrAMINE (SOMINEX) 25 MG tablet Take 25 mg by mouth at bedtime as needed for sleep.   Yes Historical Provider, MD  furosemide (LASIX) 80 MG tablet Take 1 tablet (80 mg total) by mouth daily. TAKE 1 TABLET EVERY MORNING AND TAKE 1/2 TABLET IN THE EVENING ON DAYS MONDAY,    WEDNESDAY, AND FRIDAY 02/19/15  Yes Judy Pimple, MD  isosorbide mononitrate (IMDUR) 30 MG 24 hr tablet Take 2 tablets (60 mg total) by mouth daily. 03/11/15  Yes Judy Pimple, MD  metoprolol succinate (TOPROL-XL) 100 MG 24 hr tablet TAKE 1 TABLET BY MOUTH EVERY DAY WITH A MEAL 12/25/14  Yes Judy Pimple, MD  metoprolol succinate (TOPROL-XL) 25 MG 24 hr tablet Take 1 tablet (25 mg total) by mouth daily. Take with or immediately following a meal. Take with 100 mg tablet. 05/09/15  Yes Ok Anis, NP  nitroGLYCERIN (NITROSTAT) 0.4 MG SL tablet Place 1 tablet (0.4 mg total) under the tongue every 5 (five) minutes as needed for chest pain. 04/05/15  Yes Wendall Stade, MD  NON FORMULARY Oxygen- 2 liters every 24 hours    Yes Historical Provider, MD  ranolazine (RANEXA) 500 MG 12 hr tablet Take 1 tablet (500 mg total) by mouth 2 (two) times daily. 05/09/15  Yes Ok Anis, NP  simvastatin (ZOCOR) 20 MG tablet TAKE ONE TABLET BY MOUTH AT BEDTIME 03/29/15  Yes Judy Pimple, MD  tamsulosin (FLOMAX) 0.4 MG CAPS capsule Take 1 capsule (0.4 mg total) by mouth daily. 04/27/15  Yes Judy Pimple, MD  tiotropium (SPIRIVA HANDIHALER) 18 MCG inhalation capsule INHALE ONE CAPSULE BY MOUTH ONCE DAILY 02/08/15  Yes Judy Pimple, MD  vitamin B-12 (CYANOCOBALAMIN) 1000 MCG tablet Take 1,000 mcg by mouth daily.   Yes Historical Provider, MD    Review of Systems  As above, he has been doing reasonably well. He feels that he is at his baseline. He has not been having any chest pain, palpitations, PND, orthopnea, dizziness, syncope, edema, or early satiety. He has chronic dyspnea on exertion and uses home O2. All other systems reviewed and are otherwise negative except as noted above.  Physical Exam  VS:  BP 138/66 mmHg  Pulse 66  Ht 6' (1.829 m)  Wt 91.627 kg (202 lb)  BMI 27.39 kg/m2  SpO2 80% , BMI  Body mass index is 27.39 kg/(m^2). Chronically ill white male on oxgyen HEENT: normal. Neck: Supple, no JVD, carotid bruits, or masses. Cardiac: RRR, no murmurs, rubs, or gallops. No clubbing, cyanosis, edema.  Radials/DP/PT 1+ and equal bilaterally. Right groin catheterization site is without bleeding, bruit, or hematoma. Respiratory: Poor air movement with rhonchi diffusely. GI: Soft, nontender, nondistended, BS + x 4. MS: no deformity or atrophy. Skin: warm and dry, no rash. Neuro:  Strength and sensation are intact. Psych: Normal affect.  Accessory Clinical Findings  ECG - 05/14/15   regular sinus rhythm, 69, poor R-wave progression, very mild inferolateral ST segment elevation which was present on hospital ECG as well.  Assessment & Plan  1.  Non-ST segment elevation myocardial infarction, subsequent episode of care/coronary artery disease: Status post recent hospitalization and catheterization revealing moderate to severe multivessel obstructive coronary artery disease. Given advanced age and comorbidities, medical therapy has been instituted and he has done well so far without recurrence of chest pain. He does have chronic dyspnea on exertion in the setting of COPD requiring home O2. He remains on aspirin, Plavix, nitrate, beta blocker, Ranexa, and statin therapy. I will not make any changes to his  regimen today.  2. Ischemic cardiopathy/chronic systolic congestive heart failure: Prior history of an EF of 32%. . EF by echo 05/2015 35-40%  Volume status is stable and he remains on beta blocker and nitrate therapy. He was previously taking an ARB however this is on hold in the setting of acute on chronic renal failure and contrast-induced nephropathy following catheterization.  3. Acute on chronic stage III kidney disease/contrast-induced nephropathy: Follow-up basic metabolic panel today. ARB therapy is on hold. He remains on Lasix as directed by nephrology at discharge from the hospital.  Wife indicates they do not want any further heart caths as it really set him back  4. Essential hypertension: Stable. Continue current regimen.  5. Hyperlipidemia: Continue statin therapy. LDL was 38 with normal LFTs during recent hospitalization.  6. Thrombocytopenia: Follow-up CBC today in the setting of ongoing aspirin and Plavix therapy following acute coronary syndrome a week ago.  7. COPD: Sats only 80% in office  Needs to f/u with Dr Sherene Sires who he has seen before.  Discussed advanced directives and unfortunately wife has not thought Much about this.  I advised her not to have him intubated in future given poor quality of life.  She should discuss this further with Dr Sherene Sires  8. Disposition: Follow-up with me in 6 months    Charlton Haws

## 2015-07-14 ENCOUNTER — Ambulatory Visit (INDEPENDENT_AMBULATORY_CARE_PROVIDER_SITE_OTHER): Payer: Medicare Other | Admitting: Cardiovascular Disease

## 2015-07-14 ENCOUNTER — Encounter: Payer: Self-pay | Admitting: Cardiovascular Disease

## 2015-07-14 VITALS — BP 138/66 | HR 66 | Ht 72.0 in | Wt 202.0 lb

## 2015-07-14 DIAGNOSIS — I10 Essential (primary) hypertension: Secondary | ICD-10-CM | POA: Diagnosis not present

## 2015-07-14 DIAGNOSIS — E785 Hyperlipidemia, unspecified: Secondary | ICD-10-CM | POA: Diagnosis not present

## 2015-07-14 DIAGNOSIS — I200 Unstable angina: Secondary | ICD-10-CM | POA: Diagnosis not present

## 2015-07-14 DIAGNOSIS — I5022 Chronic systolic (congestive) heart failure: Secondary | ICD-10-CM

## 2015-07-14 MED ORDER — METOPROLOL SUCCINATE ER 100 MG PO TB24: 100.0000 mg | ORAL_TABLET | Freq: Every day | ORAL | Status: DC

## 2015-07-14 NOTE — Patient Instructions (Addendum)
Medication Instructions:  Your physician has recommended you make the following change in your medication:  Decrease the Metoprolol to 100 mg taking 1 daily.  A new prescription has been sent to your pharmacy.  Labwork: NONE  Testing/Procedures: NONE  Follow-Up: Your physician wants you to follow-up in: 6 MONTHS WITH DR. Eden Emms.   You will receive a reminder letter in the mail two months in advance. If you don't receive a letter, please call our office to schedule the follow-up appointment.    Any Other Special Instructions Will Be Listed Below (If Applicable).  PT NEEDS TO SEE HIS PULMONARY DR,  DR. Sherene Sires NEXT WEEK FOR HYPOXIA AND END STAGE LUNG DISEASE.

## 2015-07-15 ENCOUNTER — Other Ambulatory Visit: Payer: Self-pay | Admitting: Family Medicine

## 2015-07-15 NOTE — Telephone Encounter (Signed)
Pt was seen recently for bruises and skin tears, just wanted to make sure it's okay to refill med, please advise

## 2015-07-15 NOTE — Telephone Encounter (Signed)
Please refill for a year - he needs to stay on it

## 2015-07-15 NOTE — Telephone Encounter (Signed)
done

## 2015-07-20 ENCOUNTER — Other Ambulatory Visit (INDEPENDENT_AMBULATORY_CARE_PROVIDER_SITE_OTHER): Payer: Medicare Other

## 2015-07-20 ENCOUNTER — Ambulatory Visit (INDEPENDENT_AMBULATORY_CARE_PROVIDER_SITE_OTHER)
Admission: RE | Admit: 2015-07-20 | Discharge: 2015-07-20 | Disposition: A | Discharge status: Home / Self Care | Payer: Medicare Other | Source: Ambulatory Visit | Attending: Internal Medicine | Admitting: Internal Medicine

## 2015-07-20 ENCOUNTER — Encounter: Payer: Self-pay | Admitting: Internal Medicine

## 2015-07-20 ENCOUNTER — Ambulatory Visit (INDEPENDENT_AMBULATORY_CARE_PROVIDER_SITE_OTHER): Payer: Medicare Other | Admitting: Internal Medicine

## 2015-07-20 VITALS — BP 122/64 | HR 86 | Ht 71.0 in | Wt 184.0 lb

## 2015-07-20 DIAGNOSIS — I5022 Chronic systolic (congestive) heart failure: Secondary | ICD-10-CM | POA: Diagnosis not present

## 2015-07-20 DIAGNOSIS — I200 Unstable angina: Secondary | ICD-10-CM

## 2015-07-20 DIAGNOSIS — R0600 Dyspnea, unspecified: Secondary | ICD-10-CM

## 2015-07-20 DIAGNOSIS — J432 Centrilobular emphysema: Secondary | ICD-10-CM

## 2015-07-20 DIAGNOSIS — J9612 Chronic respiratory failure with hypercapnia: Secondary | ICD-10-CM | POA: Diagnosis not present

## 2015-07-20 LAB — CBC WITH DIFFERENTIAL/PLATELET
Basophils Absolute: 0 10*3/uL (ref 0.0–0.1)
Basophils Relative: 0.4 % (ref 0.0–3.0)
Eosinophils Absolute: 0 10*3/uL (ref 0.0–0.7)
Eosinophils Relative: 0.1 % (ref 0.0–5.0)
HCT: 34.7 % — ABNORMAL LOW (ref 39.0–52.0)
Hemoglobin: 11.6 g/dL — ABNORMAL LOW (ref 13.0–17.0)
LYMPHS ABS: 0.8 10*3/uL (ref 0.7–4.0)
Lymphocytes Relative: 10.3 % — ABNORMAL LOW (ref 12.0–46.0)
MCHC: 33.3 g/dL (ref 30.0–36.0)
MCV: 103.2 fl — AB (ref 78.0–100.0)
MONOS PCT: 9.4 % (ref 3.0–12.0)
Monocytes Absolute: 0.8 10*3/uL (ref 0.1–1.0)
NEUTROS ABS: 6.4 10*3/uL (ref 1.4–7.7)
NEUTROS PCT: 79.8 % — AB (ref 43.0–77.0)
Platelets: 141 10*3/uL — ABNORMAL LOW (ref 150.0–400.0)
RBC: 3.37 Mil/uL — ABNORMAL LOW (ref 4.22–5.81)
RDW: 15 % (ref 11.5–15.5)
WBC: 8.1 10*3/uL (ref 4.0–10.5)

## 2015-07-20 NOTE — Patient Instructions (Signed)
Please remember to go to the lab and x-ray department downstairs for your tests - we will call you with the results when they are available.      

## 2015-07-20 NOTE — Progress Notes (Signed)
Subjective:     Patient ID: Bill Walker, male   DOB: Jun 08, 1928    MRN: [id number]    History of Present Illness  05/14/2013 "new pt" ov/Bill Walker re COPD/ chronic resp failure last seen in 2010 02 dep/ spiriva only Chief Complaint  Patient presents with  . Follow-up    Pt here to re cert for o2. Breathing has been stable and he denies any new co's today.    walks to mailbox and back then back up to 5 steps and then needs to sit down and recover on 02 2lpm pattern no change x years ? 1998, better on spiriva rec No change 02 recommendations to use 2lpm 24 hours per day.   Admit date: 05/03/2015 Discharge date: 05/09/2015  PCP: Roxy Manns, MD Primary Cardiologist: Dr. Eden Emms  Primary Discharge Diagnosis: Non-STEMI **Cath this admission revealing severe diffuse 3VD  Med Rx.  Secondary Discharge Diagnosis:   Essential hypertension  Ischemic cardiomyopathy  Chronic systolic CHF (congestive heart failure)  Coronary artery disease  Hyperlipidemia  Thrombocytopenia  CKD (chronic kidney disease), stage III/Contrast induced nephropathy  COPD (chronic obstructive pulmonary disease)  Unstable angina  Procedures: Cardiac catheterization, coronary arteriogram, CT of the head without contrast, two-view chest x-ray  Hospital Course: Bill Walker is a 81 y.o. male with a history of 3 vessel CAD medically, ischemic cardiomyopathy, cor pulmonale, COPD on home O2, thrombocytopenia, and CKD stage III. He had chest pain and came to the hospital where he was admitted for further evaluation and treatment. Initially stress test was planned, but cardiac enzymes elevated indicating a non-STEMI. Cardiac catheterization was scheduled. He was found to have severe native three-vessel disease with medical therapy recommended. Ranolazine was added to his medication regimen. He tolerated this well and did not have further c/p. He was noted to have a history of aortic aneurysm, not an  operative candidate in the past and no further workup was done. Pain-free on current therapy. His EF will be assessed by an echo as an outpatient. Previously it was 32%.  His creatinine increased after cath in his ARB was discontinued. He was seen by nephrology and creat rise was felt to be secondary to advanced age, CKD III, ARB therapy, and contrast induced nephropathy. Creat has improved over the past 48 hrs and he was felt to be stable for discharge with a plan for f/u BMET later this week. He was also noted to have a history of thrombocytopenia and his platelet count was stable.  Of note, Bill Walker had a fall and it was felt to be a mechanical fall. He hit his head and CT of the head was performed which showed no acute abnormality. There was consideration for weakness and a PT evaluation was ordered. Arrangements have been made for HHPT/RN and rolling walker. He will be discharged home today in good condition.  Labs:   Recent Labs    Lab Results  Component Value Date   WBC 8.6 05/06/2015   HGB 10.7* 05/06/2015   HCT 31.5* 05/06/2015   MCV 100.0 05/06/2015   PLT 68* 05/06/2015       Last Labs      Recent Labs Lab 05/09/15 0350  NA 135  K 4.1  CL 93*  CO2 37*  BUN 52*  CREATININE 1.85*  CALCIUM 8.3*  GLUCOSE 115*     Lab Results  Component Value Date   TROPONINI 0.11* 05/04/2015   Lipid Panel   Labs (Brief)  Component Value Date/Time   CHOL 98 05/04/2015 0537   TRIG 45 05/04/2015 0537   HDL 51 05/04/2015 0537   CHOLHDL 1.9 05/04/2015 0537   VLDL 9 05/04/2015 0537   LDLCALC 38 05/04/2015 0537       Last Labs    B NATRIURETIC PEPTIDE  Date/Time Value Ref Range Status  05/03/2015 02:50 PM 379.7* 0.0 - 100.0 pg/mL Final      Radiology: Dg Chest 2 View 05/03/2015 CLINICAL DATA: Sudden onset of substernal chest pain radiating to the back EXAM: CHEST 2 VIEW  COMPARISON: 12/29/2010 FINDINGS: Chronic cardiomegaly. Aortic and hilar contours are stable. Small pleural effusions. There is a retrocardiac rounded opacity which correlates with probable rounded atelectasis on chest CT from 2010. No evidence of edema or pneumonia. IMPRESSION: 1. Cardiomegaly with small pleural effusions but no pulmonary edema. 2. Left lower lobe scarring, present since at least 2010. Electronically Signed By: Marnee Spring M.D. On: 05/03/2015 15:44   Ct Head Wo Contrast 05/05/2015 CLINICAL DATA: Fall, hit head. On aspirin and Plavix. EXAM: CT HEAD WITHOUT CONTRAST TECHNIQUE: Contiguous axial images were obtained from the base of the skull through the vertex without intravenous contrast. COMPARISON: 03/10/2005 FINDINGS: Diffuse cerebral atrophy. No acute intracranial abnormality. Specifically, no hemorrhage, hydrocephalus, mass lesion, acute infarction, or significant intracranial injury. No acute calvarial abnormality. Visualized paranasal sinuses and mastoids clear. Orbital soft tissues unremarkable. IMPRESSION: Atrophy. No acute intracranial abnormality. Electronically Signed By: Charlett Nose M.D. On: 05/05/2015 07:29   Nm Myocar Single W/spect W/wall Motion And Ef 05/04/2015 There was a large, severe defect at rest as described below. Stress images were not done. The patient was sent to the cath lab.    Cardiac Cath: 05/04/2015 Conclusion    Coronary Findings    Dominance: Right   Left Main   . LM lesion, 45% stenosed.     Left Anterior Descending  Dist LAD filled by collaterals from 2nd Mrg.   . Mid LAD-1 lesion, 80% stenosed.   . Mid LAD-2 lesion, 99% stenosed. located at the major branch .   Marland Kitchen Dist LAD lesion, 100% stenosed. and has left-to-left collateral flow.   . Second Diagonal Branch   . Ost 2nd Diag lesion, 99% stenosed. located at the major branch .     Left Circumflex   . First Obtuse  Marginal Branch   . 1st Mrg lesion, 60% stenosed.     Right Coronary Artery   . Prox RCA lesion, 0% stenosed. Previously placed Prox RCA stent (unknown type) is patent.   . Mid RCA lesion, 60% stenosed.   . Dist RCA lesion, 70% stenosed. discrete .   Marland Kitchen Right Posterior Atrioventricular Branch   . Post Atrio-1 lesion, 70% stenosed.   Marland Kitchen Post Atrio-2 lesion, 70% stenosed.  LVEDP = 25      EKG: 05/04/2015 Sinus tachycardia Vent. rate 114 BPM            07/20/2015 f/u extended ov/Bill Walker re: re-establish f/u re;  Copd/hypercarbic and hypoxemic resp failure  on 02 3lpm downhill since admit/d/c assoc with leg swelling  Chief Complaint  Patient presents with  . Follow-up    Pt states here for f/u per Dr. Eden Emms.  He states that he gets SOB first thing in the am, then it gets better throughout the day. He is using oxygen 3 lpm 24/7.     No obvious daytime variabilty or assoc chronic cough or cp or chest tightness, subjective wheeze overt sinus or hb symptoms.  No unusual exp hx or h/o childhood pna/ asthma or knowledge of premature birth.   Sleeping ok without nocturnal  or early am exacerbation  of respiratory  c/o's or need for noct saba. Also denies any obvious fluctuation of symptoms with weather or environmental changes or other aggravating or alleviating factors except as outlined above   Current Medications, Allergies, Past Medical History, Past Surgical History, Family History, and Social History were reviewed in [employer] record.  ROS  The following are not active complaints unless bolded sore throat, dysphagia, dental problems, itching, sneezing,  nasal congestion or excess/ purulent secretions, ear ache,   fever, chills, sweats, unintended wt loss, pleuritic or exertional cp, hemoptysis,  orthopnea pnd or leg swelling, presyncope, palpitations, heartburn, abdominal pain, anorexia, nausea, vomiting, diarrhea  or change in bowel or  urinary habits, change in stools or urine, dysuria,hematuria,  rash, arthralgias, visual complaints, headache, numbness weakness or ataxia or problems with walking or coordination,  change in mood/affect or memory.  Past Medical History:  CARDIOMYOPATHY (ICD-425.4)  HYPERTENSION (ICD-401.9)  CORONARY ARTERY DISEASE (ICD-414.00)  MI (ICD-410.90)  HYPERLIPIDEMIA (ICD-272.4)  VITAMIN B12 DEFICIENCY (ICD-266.2)  PNEUMONIA, LEFT (ICD-486)  PNEUMONIA ORGANISM NOS (ICD-486)  THROMBOCYTOPENIA (ICD-287.5)  COR PULMONALE (ICD-416.9)  HEARING LOSS (ICD-389.9)  Hx of ALCOHOL ABUSE (ICD-305.00)  Hx of TOBACCO ABUSE (ICD-305.1)  RENAL INSUFFICIENCY (ICD-588.9)  OSTEOARTHRITIS (ICD-715.90)  GOUT (ICD-274.9)  DIVERTICULOSIS, COLON (ICD-562.10)  COPD (ICD-496)  ANEMIA-NOS (ICD-285.9)(follwed by heme)  PURE HYPERCHOLESTEROLEMIA (ICD-272.0)  BRONCHIECTASIS  - CT Chest 07/07/09 mild bilateral lower lobe medial bronchiectasis  Coronary artery disease  Diverticulosis, colon               Objective:   Physical Exam   07/20/2015         184 Wt Readings from Last 3 Encounters:  05/14/13 233 lb (105.688 kg)  04/01/13 234 lb 12.8 oz (106.505 kg)  12/05/12 253 lb 6.4 oz (114.941 kg)    w/c elderly wm extremely hard of hearing   HEENT mild turbinate edema.  Oropharynx no thrush or excess pnd or cobblestoning.  No JVD or cervical adenopathy. Mild accessory muscle hypertrophy. Trachea midline, nl thryroid. Chest was hyperinflated by percussion with diminished breath sounds and moderate increased exp time without wheeze. Hoover sign positive at mid inspiration. Regular rate and rhythm without murmur gallop or rub or increase P2  - 2-4+ pitting bialteral lower edema.  Abd: no hsm, nl excursion. Ext warm without cyanosis or clubbing.         I personally reviewed images and agree with radiology impression as follows:  CXR:  07/20/2015  1. COPD, bibasilar fibrosis or subsegmental atelectasis with  small bilateral pleural effusions. New increased density peripherally in the left mid hemi thorax may reflect atelectasis or pneumonia. 2. Mild stable cardiomegaly with central pulmonary vascular prominence. There is no cephalization of the vascular pattern.    Labs ordered/ reviewed:     Lab 07/20/15 1724  NA 134*  K 4.8  CL 85*  CO2 45*  BUN 27*  CREATININE 1.28  GLUCOSE 113*       Lab 07/20/15 1724  HGB 11.6*  HCT 34.7*  WBC 8.1  PLT 141.0*     Lab Results  Component Value Date   TSH 2.79 07/20/2015     Lab Results  Component Value Date   PROBNP 856.0* 07/20/2015         Assessment:

## 2015-07-21 ENCOUNTER — Encounter: Payer: Self-pay | Admitting: Internal Medicine

## 2015-07-21 LAB — BASIC METABOLIC PANEL
BUN: 27 mg/dL — AB (ref 6–23)
CO2: 45 mEq/L — ABNORMAL HIGH (ref 19–32)
Calcium: 9.1 mg/dL (ref 8.4–10.5)
Chloride: 85 mEq/L — ABNORMAL LOW (ref 96–112)
Creatinine, Ser: 1.28 mg/dL (ref 0.40–1.50)
GFR: 56.68 mL/min — ABNORMAL LOW (ref >60.00)
GLUCOSE: 113 mg/dL — AB (ref 70–99)
POTASSIUM: 4.8 meq/L (ref 3.5–5.1)
Sodium: 134 mEq/L — ABNORMAL LOW (ref 135–145)

## 2015-07-21 LAB — TSH: TSH: 2.79 u[IU]/mL (ref 0.35–4.50)

## 2015-07-21 LAB — BRAIN NATRIURETIC PEPTIDE: PRO B NATRI PEPTIDE: 856 pg/mL — AB (ref 0.0–100.0)

## 2015-07-21 NOTE — Assessment & Plan Note (Signed)
Multifactorial and largely untreatable so low threshold to involve palliative care here.

## 2015-07-21 NOTE — Assessment & Plan Note (Addendum)
-    02 2lpm 24/7 since 1998 -  HC03 45  07/20/2015   C/w severe chronic  hypercarbic -  05/14/2013   Walked RA x one lap @ 185 stopped due to sob/ desat to 88% > corrected on 2lpm and able to walk a lap s difficulty. -  Rx 3lpm since d/c  05/09/15 And hc03  38  05/14/15    Adequate control on present rx, reviewed > no change in rx needed  But really very little to offer here

## 2015-07-21 NOTE — Assessment & Plan Note (Signed)
In the absence of increased cough and with known LVEDP = 25 as outpt after "tune up" I'm inclined to believe most of his worsening sob is not copd/e but rather related to chf/cri  In fact, Though somewhat paradoxic, when the lung fails to clear C02 properly and pC02 rises the lung then becomes a more efficient scavenger of C02 allowing lower work of breathing and  better C02 clearance albeit at a higher serum pC02 level - this is why pts can look a lot better than their ABG's would suggest and why it's so difficult to prognosticate endstage dz.  It's also why I strongly rec DNI status (ventilating pts down to a nl pC02 adversely affects this compensatory mechanism)   I would strongly consider a palliative approach/ hospice care here rather than any form of further escalation of care  I had an extended discussion with the patient reviewing all relevant studies completed to date and  lasting 25 minutes of a 40  minute visit    Each maintenance medication was reviewed in detail including most importantly the difference between maintenance and prns and under what circumstances the prns are to be triggered using an action plan format that is not reflected in the computer generated alphabetically organized AVS.    Please see instructions for details which were reviewed in writing and the patient given a copy highlighting the part that I personally wrote and discussed at today's ov.

## 2015-07-21 NOTE — Assessment & Plan Note (Signed)
Note lvedp 25 p optimal medical management as inpt and progressive leg swelling now > will need additional diuresis but prefer to let Dr Eden Emms handle it.

## 2015-07-22 NOTE — Progress Notes (Signed)
Quick Note:  Called and spoke to pt's wife. Reviewed results and recs. Pt's wife voiced understanding and had no further questions. OV note and labs sent to Dr. Eden Emms. Nothing further needed ______

## 2015-07-22 NOTE — Progress Notes (Signed)
Quick Note:  Called and spoke to pt's wife. Reviewed results and recs. Pt's wife voiced understanding and had no further questions. ______ 

## 2015-08-09 ENCOUNTER — Inpatient Hospital Stay (HOSPITAL_COMMUNITY)
Admission: EM | Admit: 2015-08-09 | Discharge: 2015-08-26 | DRG: 189 | Disposition: A | Discharge status: Hospice | Payer: Medicare Other | Attending: Internal Medicine | Admitting: Internal Medicine

## 2015-08-09 ENCOUNTER — Encounter (HOSPITAL_COMMUNITY): Payer: Self-pay | Admitting: Emergency Medicine

## 2015-08-09 DIAGNOSIS — N400 Enlarged prostate without lower urinary tract symptoms: Secondary | ICD-10-CM | POA: Diagnosis present

## 2015-08-09 DIAGNOSIS — I272 Other secondary pulmonary hypertension: Secondary | ICD-10-CM | POA: Diagnosis present

## 2015-08-09 DIAGNOSIS — Z7189 Other specified counseling: Secondary | ICD-10-CM | POA: Diagnosis present

## 2015-08-09 DIAGNOSIS — Z87891 Personal history of nicotine dependence: Secondary | ICD-10-CM

## 2015-08-09 DIAGNOSIS — D696 Thrombocytopenia, unspecified: Secondary | ICD-10-CM | POA: Diagnosis present

## 2015-08-09 DIAGNOSIS — Z881 Allergy status to other antibiotic agents status: Secondary | ICD-10-CM

## 2015-08-09 DIAGNOSIS — I5023 Acute on chronic systolic (congestive) heart failure: Secondary | ICD-10-CM | POA: Diagnosis present

## 2015-08-09 DIAGNOSIS — J9621 Acute and chronic respiratory failure with hypoxia: Principal | ICD-10-CM | POA: Diagnosis present

## 2015-08-09 DIAGNOSIS — Z515 Encounter for palliative care: Secondary | ICD-10-CM | POA: Diagnosis present

## 2015-08-09 DIAGNOSIS — I4891 Unspecified atrial fibrillation: Secondary | ICD-10-CM | POA: Diagnosis present

## 2015-08-09 DIAGNOSIS — H9190 Unspecified hearing loss, unspecified ear: Secondary | ICD-10-CM | POA: Diagnosis present

## 2015-08-09 DIAGNOSIS — E538 Deficiency of other specified B group vitamins: Secondary | ICD-10-CM | POA: Diagnosis present

## 2015-08-09 DIAGNOSIS — I252 Old myocardial infarction: Secondary | ICD-10-CM

## 2015-08-09 DIAGNOSIS — J441 Chronic obstructive pulmonary disease with (acute) exacerbation: Secondary | ICD-10-CM | POA: Diagnosis present

## 2015-08-09 DIAGNOSIS — J9622 Acute and chronic respiratory failure with hypercapnia: Secondary | ICD-10-CM | POA: Diagnosis present

## 2015-08-09 DIAGNOSIS — I255 Ischemic cardiomyopathy: Secondary | ICD-10-CM | POA: Diagnosis present

## 2015-08-09 DIAGNOSIS — D649 Anemia, unspecified: Secondary | ICD-10-CM | POA: Diagnosis present

## 2015-08-09 DIAGNOSIS — N179 Acute kidney failure, unspecified: Secondary | ICD-10-CM | POA: Insufficient documentation

## 2015-08-09 DIAGNOSIS — R634 Abnormal weight loss: Secondary | ICD-10-CM | POA: Diagnosis present

## 2015-08-09 DIAGNOSIS — J810 Acute pulmonary edema: Secondary | ICD-10-CM | POA: Diagnosis present

## 2015-08-09 DIAGNOSIS — Z880 Allergy status to penicillin: Secondary | ICD-10-CM

## 2015-08-09 DIAGNOSIS — E874 Mixed disorder of acid-base balance: Secondary | ICD-10-CM | POA: Diagnosis present

## 2015-08-09 DIAGNOSIS — R630 Anorexia: Secondary | ICD-10-CM | POA: Diagnosis present

## 2015-08-09 DIAGNOSIS — Z7902 Long term (current) use of antithrombotics/antiplatelets: Secondary | ICD-10-CM

## 2015-08-09 DIAGNOSIS — E785 Hyperlipidemia, unspecified: Secondary | ICD-10-CM | POA: Diagnosis present

## 2015-08-09 DIAGNOSIS — I2781 Cor pulmonale (chronic): Secondary | ICD-10-CM | POA: Diagnosis present

## 2015-08-09 DIAGNOSIS — E875 Hyperkalemia: Secondary | ICD-10-CM | POA: Insufficient documentation

## 2015-08-09 DIAGNOSIS — R627 Adult failure to thrive: Secondary | ICD-10-CM | POA: Insufficient documentation

## 2015-08-09 DIAGNOSIS — F0390 Unspecified dementia without behavioral disturbance: Secondary | ICD-10-CM | POA: Diagnosis present

## 2015-08-09 DIAGNOSIS — N183 Chronic kidney disease, stage 3 unspecified: Secondary | ICD-10-CM | POA: Insufficient documentation

## 2015-08-09 DIAGNOSIS — I480 Paroxysmal atrial fibrillation: Secondary | ICD-10-CM | POA: Insufficient documentation

## 2015-08-09 DIAGNOSIS — Z7982 Long term (current) use of aspirin: Secondary | ICD-10-CM

## 2015-08-09 DIAGNOSIS — E46 Unspecified protein-calorie malnutrition: Secondary | ICD-10-CM | POA: Diagnosis present

## 2015-08-09 DIAGNOSIS — I5020 Unspecified systolic (congestive) heart failure: Secondary | ICD-10-CM | POA: Insufficient documentation

## 2015-08-09 DIAGNOSIS — R0600 Dyspnea, unspecified: Secondary | ICD-10-CM

## 2015-08-09 DIAGNOSIS — M1990 Unspecified osteoarthritis, unspecified site: Secondary | ICD-10-CM | POA: Diagnosis present

## 2015-08-09 DIAGNOSIS — R601 Generalized edema: Secondary | ICD-10-CM | POA: Insufficient documentation

## 2015-08-09 DIAGNOSIS — Z955 Presence of coronary angioplasty implant and graft: Secondary | ICD-10-CM

## 2015-08-09 DIAGNOSIS — I1310 Hypertensive heart and chronic kidney disease without heart failure, with stage 1 through stage 4 chronic kidney disease, or unspecified chronic kidney disease: Secondary | ICD-10-CM | POA: Diagnosis present

## 2015-08-09 DIAGNOSIS — Z66 Do not resuscitate: Secondary | ICD-10-CM | POA: Diagnosis not present

## 2015-08-09 DIAGNOSIS — Z9981 Dependence on supplemental oxygen: Secondary | ICD-10-CM

## 2015-08-09 DIAGNOSIS — J449 Chronic obstructive pulmonary disease, unspecified: Secondary | ICD-10-CM | POA: Diagnosis present

## 2015-08-09 DIAGNOSIS — Z79899 Other long term (current) drug therapy: Secondary | ICD-10-CM

## 2015-08-09 DIAGNOSIS — Z8701 Personal history of pneumonia (recurrent): Secondary | ICD-10-CM

## 2015-08-09 DIAGNOSIS — E871 Hypo-osmolality and hyponatremia: Secondary | ICD-10-CM | POA: Diagnosis present

## 2015-08-09 DIAGNOSIS — R0902 Hypoxemia: Secondary | ICD-10-CM

## 2015-08-09 DIAGNOSIS — T502X5A Adverse effect of carbonic-anhydrase inhibitors, benzothiadiazides and other diuretics, initial encounter: Secondary | ICD-10-CM | POA: Diagnosis present

## 2015-08-09 DIAGNOSIS — I2510 Atherosclerotic heart disease of native coronary artery without angina pectoris: Secondary | ICD-10-CM | POA: Diagnosis present

## 2015-08-09 DIAGNOSIS — I10 Essential (primary) hypertension: Secondary | ICD-10-CM | POA: Diagnosis present

## 2015-08-09 DIAGNOSIS — R0602 Shortness of breath: Secondary | ICD-10-CM | POA: Diagnosis not present

## 2015-08-09 DIAGNOSIS — J9612 Chronic respiratory failure with hypercapnia: Secondary | ICD-10-CM | POA: Diagnosis present

## 2015-08-09 DIAGNOSIS — J189 Pneumonia, unspecified organism: Secondary | ICD-10-CM | POA: Diagnosis present

## 2015-08-09 NOTE — ED Provider Notes (Signed)
CSN: [id number]     Arrival date & time 08/09/15  2327 History  By signing my name below, I, Evon Slack, attest that this documentation has been prepared under the direction and in the presence of Geriann Lafont, MD. Electronically Signed: Evon Slack, ED Scribe. 08/09/2015. 11:51 PM.    Chief Complaint  Patient presents with  . Shortness of Breath    Patient is a Bill Walker presenting with shortness of breath. The history is provided by the patient. No language interpreter was used.  Shortness of Breath  HPI Comments: Level 5 Caveat: Dementia/ Mental status change Buren Havey is a Bill Walker brought in by ambulance, who presents to the Emergency Department complaining of gradually worsening SOB onset 1 week prior. Pt also has increased weakness and decreased urine output. Family states that his symptoms began around June 2016 and have been progressively getting worse but recently worsened over the past week. Family states that PT has recent seen his PCP who has not made in his changes. Family states that he has intermittent episodes of dementia and hallucinations.   Past Medical History  Diagnosis Date  . Ischemic cardiomyopathy     a. 02/2005 EF 32% (myoview report).  . Hypertension   . Coronary artery disease     a. s/p prior MI w/ RCA stenting;  b. 03/2011 Cath: LM 20d, LAD 40p/100p, LCX 40p, L->L collats to distal LAD, RCA dominant, patent prox stent, 43m ISR, 50-80m, 50-60d, 60d-->Med Rx;  c. 04/2015 NSTEMI/Cath: LM 45, LAD 80/32m, 100d (L->L collats), D2 99ost, OM1 60, RCA patent stent prox, 19m, 70d, RPAV1/2 70->Med Rx.  Marland Kitchen Hyperlipidemia   . Vitamin B12 deficiency   . History of pneumonia   . Thrombocytopenia   . Cor pulmonale   . Hearing loss   . Alcohol abuse, unspecified   . Tobacco use disorder   . CKD (chronic kidney disease), stage III   . Osteoarthritis   . Gout   . Diverticulosis of colon   . COPD (chronic obstructive pulmonary disease)   . Anemia   .  Bronchiectasis     CT Chest 07/07/09 bilateral lower lobe medial bronchiectasis  . Chronic systolic CHF (congestive heart failure)     a. 02/2005 EF 32% (myoview report).   Past Surgical History  Procedure Laterality Date  . Cardiac catheterization      (catheterization 2006 demonstrated an   occluded LAD.  There is a proximal stent in the RCA which was   patent.  He had distal 75% and 95% RCA lesions.  Initially it was  thought that he would have PCI but he was managed medically).  . Mri      Lumbar disk disease 06/1999  . Esophagogastroduodenoscopy      gastritis 12/1999  . Cardiac catheterization N/A 05/04/2015    Procedure: Left Heart Cath and Coronary Angiography;  Surgeon: Lennette Bihari, MD; LAD under percent, D2 99%, CFX patent, OM1 60% RCA 70%, previous stent is patent, no LV gram, 100 mL dye used   Family History  Problem Relation Age of Onset  . Lymphoma Brother   . Prostate cancer Brother    Social History  Substance Use Topics  . Smoking status: Former Smoker -- 1.00 packs/day for 48 years    Quit date: 11/13/1996  . Smokeless tobacco: None  . Alcohol Use: No    Review of Systems  Unable to perform ROS: Mental status change  Respiratory: Positive for shortness of breath.  Allergies  Doxazosin mesylate and Penicillins  Home Medications   Prior to Admission medications   Medication Sig Start Date End Date Taking? Authorizing Provider  aspirin 81 MG tablet Take 1 tablet (81 mg total) by mouth daily. 05/09/15   Ok Anis, NP  calcium carbonate (TUMS - DOSED IN MG ELEMENTAL CALCIUM) 500 MG chewable tablet Chew 1 tablet by mouth daily.    Historical Provider, MD  cholecalciferol (VITAMIN D) 1000 UNITS tablet Take 1 tablet (1,000 Units total) by mouth daily. 04/01/13   Wendall Stade, MD  clopidogrel (PLAVIX) 75 MG tablet TAKE ONE TABLET BY MOUTH ONCE DAILY 07/15/15   Judy Pimple, MD  diphenhydrAMINE (SOMINEX) 25 MG tablet Take 50 mg by mouth at bedtime as  needed for sleep.     Historical Provider, MD  furosemide (LASIX) 80 MG tablet TAKE 1 TABLET BY MOUTH DAILY & TAKE 1/2 TAB BY MOUTH IN THE EVENINGS ON Monday, Wed & Friday ONLY.    Historical Provider, MD  isosorbide mononitrate (IMDUR) 30 MG 24 hr tablet TAKE TWO TABLETS BY MOUTH ONCE DAILY 06/21/15   Judy Pimple, MD  metoprolol succinate (TOPROL-XL) 100 MG 24 hr tablet Take 1 tablet (100 mg total) by mouth daily. Take with or immediately following a meal. 07/14/15   Wendall Stade, MD  nitroGLYCERIN (NITROSTAT) 0.4 MG SL tablet Place 0.4 mg under the tongue every 5 (five) minutes as needed for chest pain (up to 3 doses).    Historical Provider, MD  NON FORMULARY Oxygen- 3 lpm 24/7    Historical Provider, MD  ranolazine (RANEXA) 500 MG 12 hr tablet Take 1 tablet (500 mg total) by mouth 2 (two) times daily. 05/09/15   Ok Anis, NP  simvastatin (ZOCOR) 20 MG tablet TAKE ONE TABLET BY MOUTH AT BEDTIME 07/05/15   Judy Pimple, MD  tamsulosin (FLOMAX) 0.4 MG CAPS capsule Take 1 capsule (0.4 mg total) by mouth daily. 04/27/15   Judy Pimple, MD  tiotropium (SPIRIVA HANDIHALER) 18 MCG inhalation capsule INHALE ONE CAPSULE BY MOUTH ONCE DAILY 02/08/15   Judy Pimple, MD  vitamin B-12 (CYANOCOBALAMIN) 1000 MCG tablet Take 1,000 mcg by mouth daily.    Historical Provider, MD   Pulse 93  Temp(Src) 98.4 F (36.9 C) (Oral)  Resp 23  Ht  (1.854 m)  Wt 184 lb (83.462 kg)  BMI 24.28 kg/m2  SpO2 99%   Physical Exam  Constitutional: He is oriented to person, place, and time. He appears well-developed and well-nourished. No distress.  HENT:  Head: Normocephalic and atraumatic.  Tachy mucous membranes.   Eyes: Conjunctivae and EOM are normal. Pupils are equal, round, and reactive to light.  lenses implants noted.   Neck: Neck supple. No tracheal deviation present.  Cardiovascular: Normal rate, regular rhythm and normal heart sounds.   Pulmonary/Chest: Effort normal. No respiratory distress.  He has decreased breath sounds. He has no rales.  diminished breath sounds bilaterally.   Abdominal: Soft. He exhibits no mass. There is no rebound and no guarding.  Musculoskeletal: Normal range of motion.  Neurological: He is alert and oriented to person, place, and time.  Skin: Skin is warm and dry.  Psychiatric: He has a normal mood and affect. His behavior is normal.  Nursing note and vitals reviewed.   ED Course  Procedures (including critical care time) DIAGNOSTIC STUDIES: Oxygen Saturation is 99% on 3L [place], normal by my interpretation.    COORDINATION OF CARE:  11:52 PM-Discussed treatment plan with family at bedside and family agreed to plan.    Labs Review Labs Reviewed - No data to display  Imaging Review No results found.    EKG Interpretation   Date/Time:  Monday August 09 2015 23:42:13 EDT Ventricular Rate:  91 PR Interval:  167 QRS Duration: 98 QT Interval:  357 QTC Calculation: 439 R Axis:   37 Text Interpretation:  Sinus rhythm Inferior infarct, old Anterior infarct,  old Confirmed by [facility]  MD, Zackari Ruane ([id number]) on 08/09/2015 11:44:31 PM      MDM   Final diagnoses:  None   Results for orders placed or performed during the hospital encounter of 08/09/15  CBC with Differential/Platelet  Result Value Ref Range   WBC 4.6 4.0 - 10.5 K/uL   RBC 2.99 (L) 4.22 - 5.81 MIL/uL   Hemoglobin 10.4 (L) 13.0 - 17.0 g/dL   HCT 82.4 (L) 23.5 - 36.1 %   MCV 107.4 (H) 78.0 - 100.0 fL   MCH 34.8 (H) 26.0 - 34.0 pg   MCHC 32.4 30.0 - 36.0 g/dL   RDW 44.3 15.4 - 00.8 %   Platelets 54 (L) 150 - 400 K/uL   Neutrophils Relative % Bill %   Neutro Abs 3.6 1.7 - 7.7 K/uL   Lymphocytes Relative 7 %   Lymphs Abs 0.3 (L) 0.7 - 4.0 K/uL   Monocytes Relative 15 %   Monocytes Absolute 0.7 0.1 - 1.0 K/uL   Eosinophils Relative 0 %   Eosinophils Absolute 0.0 0.0 - 0.7 K/uL   Basophils Relative 0 %   Basophils Absolute 0.0 0.0 - 0.1 K/uL  Brain natriuretic peptide   Result Value Ref Range   B Natriuretic Peptide 891.3 (H) 0.0 - 100.0 pg/mL  Urinalysis, Routine w reflex microscopic (not at [facility])  Result Value Ref Range   Color, Urine AMBER (A) YELLOW   APPearance CLOUDY (A) CLEAR   Specific Gravity, Urine 1.015 1.005 - 1.030   pH 5.0 5.0 - 8.0   Glucose, UA NEGATIVE NEGATIVE mg/dL   Hgb urine dipstick SMALL (A) NEGATIVE   Bilirubin Urine SMALL (A) NEGATIVE   Ketones, ur NEGATIVE NEGATIVE mg/dL   Protein, ur 676 (A) NEGATIVE mg/dL   Urobilinogen, UA 1.0 0.0 - 1.0 mg/dL   Nitrite NEGATIVE NEGATIVE   Leukocytes, UA NEGATIVE NEGATIVE  Urine microscopic-add on  Result Value Ref Range   WBC, UA 0-2 <3 WBC/hpf   RBC / HPF 7-10 <3 RBC/hpf   Bacteria, UA RARE RARE   Casts HYALINE CASTS (A) NEGATIVE   Urine-Other MUCOUS PRESENT   Blood gas, arterial  Result Value Ref Range   FIO2 100.00    Delivery systems NON-REBREATHER OXYGEN MASK    pH, Arterial 7.217 (L) 7.350 - 7.450   pCO2 arterial 118 (HH) 35.0 - 45.0 mmHg   pO2, Arterial 296 (H) 80.0 - 100.0 mmHg   Bicarbonate 46.1 (H) 20.0 - 24.0 mEq/L   TCO2 49.7 0 - 100 mmol/L   Acid-Base Excess 17.9 (H) 0.0 - 2.0 mmol/L   O2 Saturation 99.9 %   Patient temperature 98.6    Collection site RIGHT RADIAL    Drawn by [id number]    Sample type ARTERIAL    Allens test (pass/fail) PASS PASS  I-Stat Chem 8, ED  Result Value Ref Range   Sodium 129 (L) 135 - 145 mmol/L   Potassium 4.6 3.5 - 5.1 mmol/L   Chloride 78 (L) 101 - 111 mmol/L  BUN 42 (H) 6 - 20 mg/dL   Creatinine, Ser 4.09 (H) 0.61 - 1.24 mg/dL   Glucose, Bld 811 (H) 65 - 99 mg/dL   Calcium, Ion 9.14 7.82 - 1.30 mmol/L   TCO2 44 0 - 100 mmol/L   Hemoglobin 11.6 (L) 13.0 - 17.0 g/dL   HCT 95.6 (L) 21.3 - 08.6 %  I-stat troponin, ED  Result Value Ref Range   Troponin i, poc 0.04 0.00 - 0.08 ng/mL   Comment 3           Dg Chest 2 View  08/10/2015   CLINICAL DATA:  Shortness of breath  EXAM: CHEST  2 VIEW  COMPARISON:  07/20/2015  FINDINGS:  Progressive bibasilar opacity suggesting pneumonia.  Small bilateral pleural effusion. Interstitial coarsening with cephalized blood flow, increased from prior.  Chronic cardiomegaly. Background of hyperinflation bronchitic changes.  IMPRESSION: 1. Progressive bibasilar airspace disease, primarily concerning for pneumonia. 2. Superimposed CHF pattern. 3. Background COPD.   Electronically Signed   By: Marnee Spring M.D.   On: 08/10/2015 00:53   Dg Chest 2 View  07/21/2015   CLINICAL DATA:  Worsening of shortness of breath since June of 2016, history of CHF, coronary artery disease, bronchiectasis, and COPD with cor pulmonale  EXAM: CHEST  2 VIEW  COMPARISON:  Chest x-ray of May 09, 2015  FINDINGS: The lungs remain hyperinflated. There are persistent small bilateral pleural effusions. Bibasilar fibrotic changes versus interstitial infiltrates are present. New subpleural increased density in the left mid lung. The cardiac silhouette remains enlarged. The central pulmonary vascularity remains prominent. There is multilevel degenerative disc disease of the thoracic spine.  IMPRESSION: 1. COPD, bibasilar fibrosis or subsegmental atelectasis with small bilateral pleural effusions. New increased density peripherally in the left mid hemi thorax may reflect atelectasis or pneumonia. 2. Mild stable cardiomegaly with central pulmonary vascular prominence. There is no cephalization of the vascular pattern.   Electronically Signed   By: David  [place] M.D.   On: 07/21/2015 07:56   Ct Head Wo Contrast  08/10/2015   CLINICAL DATA:  Increasing shortness of breath, weakness and decreased urinary output. Oxygen desaturation. History of alcohol and tobacco use, chronic kidney disease, thrombocytopenia, hypertension.  EXAM: CT HEAD WITHOUT CONTRAST  TECHNIQUE: Contiguous axial images were obtained from the base of the skull through the vertex without intravenous contrast.  COMPARISON:  CT head May 05, 2015  FINDINGS: Moderate to  severe ventriculomegaly, on the basis of global parenchymal brain volume loss, unchanged from prior imaging. No intraparenchymal hemorrhage, mass effect nor midline shift. Patchy supratentorial white matter hypodensities are less than expected for patient's age and though non-specific suggest sequelae of chronic small vessel ischemic disease. No acute large vascular territory infarcts.  No abnormal extra-axial fluid collections. Basal cisterns are patent. Moderate calcific atherosclerosis of the carotid siphons.  No skull fracture. The included ocular globes and orbital contents are non-suspicious. Status post bilateral ocular lens implants. Near complete opacification LEFT sphenoid sinus, small RIGHT sphenoid sinus air-fluid level.  IMPRESSION: No acute intracranial process.  Stable chronic changes including moderate to severe global brain atrophy.  Worsening LEFT greater than RIGHT sphenoid sinusitis.   Electronically Signed   By: Awilda Metro M.D.   On: 08/10/2015 00:31    Medications  furosemide (LASIX) injection 40 mg (40 mg Intravenous Given 08/10/15 0221)  vancomycin (VANCOCIN) IVPB 1000 mg/200 mL premix (1,000 mg Intravenous New Bag/Given 08/10/15 0226)  levofloxacin (LEVAQUIN) IVPB 500 mg (500 mg Intravenous New  Bag/Given 08/10/15 0226)     Started bipap for hypercapnea.    MDM Number of Diagnoses or Management Options Systolic congestive heart failure, unspecified congestive heart failure chronicity:  Critical Care Total time providing critical care: 30-74 minutes MDM Reviewed: previous chart, nursing note and vitals Reviewed previous: labs Interpretation: labs, ECG, x-ray and CT scan (chf by me on Cxr atrophy by me on CT, extreme hypercapnea and acidosis by me on ABG) Total time providing critical care: 30-74 minutes. This excludes time spent performing separately reportable procedures and services. Consults: admitting MD     CRITICAL CARE Performed by: Jasmine Awe Total critical care time: 61 minutes Critical care time was exclusive of separately billable procedures and treating other patients. Critical care was necessary to treat or prevent imminent or life-threatening deterioration. Critical care was time spent personally by me on the following activities: development of treatment plan with patient and/or surrogate as well as nursing, discussions with consultants, evaluation of patient's response to treatment, examination of patient, obtaining history from patient or surrogate, ordering and performing treatments and interventions, ordering and review of laboratory studies, ordering and review of radiographic studies, pulse oximetry and re-evaluation of patient's condition.   I personally performed the services described in this documentation, which was scribed in my presence. The recorded information has been reviewed and is accurate.    EKG Interpretation   Date/Time:  Monday August 09 2015 23:42:13 EDT Ventricular Rate:  91 PR Interval:  167 QRS Duration: 98 QT Interval:  357 QTC Calculation: 439 R Axis:   37 Text Interpretation:  Sinus rhythm Inferior infarct, old Anterior infarct,  old Confirmed by [facility]  MD, Jequan Shahin ([id number]) on 08/09/2015 11:44:31 PM           Jenefer Woerner, MD 08/10/15 [id number]

## 2015-08-09 NOTE — ED Notes (Signed)
Pt brought to ED by [facility] from home for c/o SOB, increase weakness, and decrease urine output. Pt is unable to be flat on his back O2 level drops to low 70% on 4L [place], pt place on NRM by [facility] 100% O2. Pt  has hx of Dementia and is very HOH, family member states to [facility] that pt is a DNR no papers available for this. denies any pain at this time, no family member at the bedside now.

## 2015-08-10 ENCOUNTER — Encounter (HOSPITAL_COMMUNITY): Payer: Self-pay | Admitting: Emergency Medicine

## 2015-08-10 ENCOUNTER — Emergency Department (HOSPITAL_COMMUNITY): Payer: Medicare Other

## 2015-08-10 DIAGNOSIS — E46 Unspecified protein-calorie malnutrition: Secondary | ICD-10-CM | POA: Diagnosis present

## 2015-08-10 DIAGNOSIS — R627 Adult failure to thrive: Secondary | ICD-10-CM | POA: Diagnosis present

## 2015-08-10 DIAGNOSIS — D696 Thrombocytopenia, unspecified: Secondary | ICD-10-CM | POA: Diagnosis present

## 2015-08-10 DIAGNOSIS — J9621 Acute and chronic respiratory failure with hypoxia: Secondary | ICD-10-CM | POA: Diagnosis not present

## 2015-08-10 DIAGNOSIS — I270 Primary pulmonary hypertension: Secondary | ICD-10-CM

## 2015-08-10 DIAGNOSIS — R0600 Dyspnea, unspecified: Secondary | ICD-10-CM | POA: Diagnosis not present

## 2015-08-10 DIAGNOSIS — E785 Hyperlipidemia, unspecified: Secondary | ICD-10-CM | POA: Diagnosis present

## 2015-08-10 DIAGNOSIS — I255 Ischemic cardiomyopathy: Secondary | ICD-10-CM | POA: Diagnosis present

## 2015-08-10 DIAGNOSIS — N179 Acute kidney failure, unspecified: Secondary | ICD-10-CM | POA: Diagnosis not present

## 2015-08-10 DIAGNOSIS — R634 Abnormal weight loss: Secondary | ICD-10-CM | POA: Diagnosis not present

## 2015-08-10 DIAGNOSIS — R630 Anorexia: Secondary | ICD-10-CM | POA: Diagnosis present

## 2015-08-10 DIAGNOSIS — E875 Hyperkalemia: Secondary | ICD-10-CM | POA: Diagnosis not present

## 2015-08-10 DIAGNOSIS — E538 Deficiency of other specified B group vitamins: Secondary | ICD-10-CM | POA: Diagnosis present

## 2015-08-10 DIAGNOSIS — E871 Hypo-osmolality and hyponatremia: Secondary | ICD-10-CM | POA: Diagnosis present

## 2015-08-10 DIAGNOSIS — J9622 Acute and chronic respiratory failure with hypercapnia: Secondary | ICD-10-CM | POA: Diagnosis present

## 2015-08-10 DIAGNOSIS — I5023 Acute on chronic systolic (congestive) heart failure: Secondary | ICD-10-CM | POA: Diagnosis present

## 2015-08-10 DIAGNOSIS — J189 Pneumonia, unspecified organism: Secondary | ICD-10-CM | POA: Diagnosis not present

## 2015-08-10 DIAGNOSIS — I480 Paroxysmal atrial fibrillation: Secondary | ICD-10-CM | POA: Diagnosis not present

## 2015-08-10 DIAGNOSIS — J810 Acute pulmonary edema: Secondary | ICD-10-CM | POA: Diagnosis present

## 2015-08-10 DIAGNOSIS — J441 Chronic obstructive pulmonary disease with (acute) exacerbation: Secondary | ICD-10-CM | POA: Diagnosis present

## 2015-08-10 DIAGNOSIS — I272 Other secondary pulmonary hypertension: Secondary | ICD-10-CM | POA: Diagnosis present

## 2015-08-10 DIAGNOSIS — Z79899 Other long term (current) drug therapy: Secondary | ICD-10-CM | POA: Diagnosis not present

## 2015-08-10 DIAGNOSIS — Z87891 Personal history of nicotine dependence: Secondary | ICD-10-CM | POA: Diagnosis not present

## 2015-08-10 DIAGNOSIS — N400 Enlarged prostate without lower urinary tract symptoms: Secondary | ICD-10-CM | POA: Diagnosis present

## 2015-08-10 DIAGNOSIS — J9612 Chronic respiratory failure with hypercapnia: Secondary | ICD-10-CM | POA: Diagnosis not present

## 2015-08-10 DIAGNOSIS — I252 Old myocardial infarction: Secondary | ICD-10-CM | POA: Diagnosis not present

## 2015-08-10 DIAGNOSIS — Z7189 Other specified counseling: Secondary | ICD-10-CM | POA: Diagnosis present

## 2015-08-10 DIAGNOSIS — I5020 Unspecified systolic (congestive) heart failure: Secondary | ICD-10-CM | POA: Diagnosis not present

## 2015-08-10 DIAGNOSIS — I10 Essential (primary) hypertension: Secondary | ICD-10-CM | POA: Diagnosis not present

## 2015-08-10 DIAGNOSIS — J438 Other emphysema: Secondary | ICD-10-CM

## 2015-08-10 DIAGNOSIS — Z9981 Dependence on supplemental oxygen: Secondary | ICD-10-CM | POA: Diagnosis not present

## 2015-08-10 DIAGNOSIS — M1990 Unspecified osteoarthritis, unspecified site: Secondary | ICD-10-CM | POA: Diagnosis present

## 2015-08-10 DIAGNOSIS — I2510 Atherosclerotic heart disease of native coronary artery without angina pectoris: Secondary | ICD-10-CM | POA: Diagnosis not present

## 2015-08-10 DIAGNOSIS — Z955 Presence of coronary angioplasty implant and graft: Secondary | ICD-10-CM | POA: Diagnosis not present

## 2015-08-10 DIAGNOSIS — Z881 Allergy status to other antibiotic agents status: Secondary | ICD-10-CM | POA: Diagnosis not present

## 2015-08-10 DIAGNOSIS — F0390 Unspecified dementia without behavioral disturbance: Secondary | ICD-10-CM | POA: Diagnosis present

## 2015-08-10 DIAGNOSIS — Z7902 Long term (current) use of antithrombotics/antiplatelets: Secondary | ICD-10-CM | POA: Diagnosis not present

## 2015-08-10 DIAGNOSIS — Z880 Allergy status to penicillin: Secondary | ICD-10-CM | POA: Diagnosis not present

## 2015-08-10 DIAGNOSIS — Z66 Do not resuscitate: Secondary | ICD-10-CM | POA: Diagnosis not present

## 2015-08-10 DIAGNOSIS — J449 Chronic obstructive pulmonary disease, unspecified: Secondary | ICD-10-CM

## 2015-08-10 DIAGNOSIS — H9190 Unspecified hearing loss, unspecified ear: Secondary | ICD-10-CM | POA: Diagnosis present

## 2015-08-10 DIAGNOSIS — I1310 Hypertensive heart and chronic kidney disease without heart failure, with stage 1 through stage 4 chronic kidney disease, or unspecified chronic kidney disease: Secondary | ICD-10-CM | POA: Diagnosis present

## 2015-08-10 DIAGNOSIS — E874 Mixed disorder of acid-base balance: Secondary | ICD-10-CM | POA: Diagnosis not present

## 2015-08-10 DIAGNOSIS — I4891 Unspecified atrial fibrillation: Secondary | ICD-10-CM | POA: Diagnosis not present

## 2015-08-10 DIAGNOSIS — Z7982 Long term (current) use of aspirin: Secondary | ICD-10-CM | POA: Diagnosis not present

## 2015-08-10 DIAGNOSIS — Z515 Encounter for palliative care: Secondary | ICD-10-CM | POA: Diagnosis not present

## 2015-08-10 DIAGNOSIS — Z8701 Personal history of pneumonia (recurrent): Secondary | ICD-10-CM | POA: Diagnosis not present

## 2015-08-10 DIAGNOSIS — D649 Anemia, unspecified: Secondary | ICD-10-CM | POA: Diagnosis present

## 2015-08-10 DIAGNOSIS — N183 Chronic kidney disease, stage 3 (moderate): Secondary | ICD-10-CM | POA: Diagnosis not present

## 2015-08-10 DIAGNOSIS — R601 Generalized edema: Secondary | ICD-10-CM | POA: Diagnosis not present

## 2015-08-10 DIAGNOSIS — T502X5A Adverse effect of carbonic-anhydrase inhibitors, benzothiadiazides and other diuretics, initial encounter: Secondary | ICD-10-CM | POA: Diagnosis present

## 2015-08-10 DIAGNOSIS — R0602 Shortness of breath: Secondary | ICD-10-CM | POA: Diagnosis present

## 2015-08-10 DIAGNOSIS — I509 Heart failure, unspecified: Secondary | ICD-10-CM | POA: Insufficient documentation

## 2015-08-10 DIAGNOSIS — I2781 Cor pulmonale (chronic): Secondary | ICD-10-CM | POA: Diagnosis present

## 2015-08-10 LAB — BLOOD GAS, ARTERIAL
ACID-BASE EXCESS: 17.9 mmol/L — AB (ref 0.0–2.0)
ACID-BASE EXCESS: 17.8 mmol/L — AB (ref 0.0–2.0)
Acid-Base Excess: 18.1 mmol/L — ABNORMAL HIGH (ref 0.0–2.0)
BICARBONATE: 46.1 meq/L — AB (ref 20.0–24.0)
Bicarbonate: 44.6 mEq/L — ABNORMAL HIGH (ref 20.0–24.0)
Bicarbonate: 43.5 mEq/L — ABNORMAL HIGH (ref 20.0–24.0)
DRAWN BY: 313941
Delivery systems: POSITIVE
Delivery systems: POSITIVE
Drawn by: 398981
Drawn by: 31101
Expiratory PAP: 6
Expiratory PAP: 5
FIO2: 100
FIO2: 0.35
FIO2: 0.35
INSPIRATORY PAP: 12
Inspiratory PAP: 12
MODE: POSITIVE
O2 SAT: 99.9 %
O2 SAT: 92.9 %
O2 SAT: 96.1 %
PATIENT TEMPERATURE: 98.6
PATIENT TEMPERATURE: 98.6
PATIENT TEMPERATURE: 98.6
PCO2 ART: 118 mmHg — AB (ref 35.0–45.0)
PCO2 ART: 68.1 mmHg — AB (ref 35.0–45.0)
PO2 ART: 296 mmHg — AB (ref 80.0–100.0)
PO2 ART: 79.9 mmHg — AB (ref 80.0–100.0)
TCO2: 49.7 mmol/L (ref 0–100)
TCO2: 47.1 mmol/L (ref 0–100)
TCO2: 45.5 mmol/L (ref 0–100)
pCO2 arterial: 81.7 mmHg (ref 35.0–45.0)
pH, Arterial: 7.217 — ABNORMAL LOW (ref 7.350–7.450)
pH, Arterial: 7.356 (ref 7.350–7.450)
pH, Arterial: 7.421 (ref 7.350–7.450)
pO2, Arterial: 67.7 mmHg — ABNORMAL LOW (ref 80.0–100.0)

## 2015-08-10 LAB — CBC WITH DIFFERENTIAL/PLATELET
BASOS ABS: 0 10*3/uL (ref 0.0–0.1)
BASOS PCT: 0 %
Basophils Absolute: 0 10*3/uL (ref 0.0–0.1)
Basophils Relative: 0 %
EOS PCT: 0 %
EOS PCT: 0 %
Eosinophils Absolute: 0 10*3/uL (ref 0.0–0.7)
Eosinophils Absolute: 0 10*3/uL (ref 0.0–0.7)
HCT: 32.1 % — ABNORMAL LOW (ref 39.0–52.0)
HEMATOCRIT: 32.7 % — AB (ref 39.0–52.0)
Hemoglobin: 10.4 g/dL — ABNORMAL LOW (ref 13.0–17.0)
Hemoglobin: 10.5 g/dL — ABNORMAL LOW (ref 13.0–17.0)
LYMPHS ABS: 0.5 10*3/uL — AB (ref 0.7–4.0)
LYMPHS PCT: 7 %
Lymphocytes Relative: 9 %
Lymphs Abs: 0.3 10*3/uL — ABNORMAL LOW (ref 0.7–4.0)
MCH: 34.8 pg — ABNORMAL HIGH (ref 26.0–34.0)
MCH: 34.3 pg — AB (ref 26.0–34.0)
MCHC: 32.1 g/dL (ref 30.0–36.0)
MCHC: 32.4 g/dL (ref 30.0–36.0)
MCV: 106.9 fL — AB (ref 78.0–100.0)
MCV: 107.4 fL — AB (ref 78.0–100.0)
MONO ABS: 0.7 10*3/uL (ref 0.1–1.0)
MONO ABS: 0.8 10*3/uL (ref 0.1–1.0)
MONOS PCT: 14 %
MONOS PCT: 15 %
NEUTROS ABS: 4.6 10*3/uL (ref 1.7–7.7)
Neutro Abs: 3.6 10*3/uL (ref 1.7–7.7)
Neutrophils Relative %: 79 %
Neutrophils Relative %: 77 %
PLATELETS: 54 10*3/uL — AB (ref 150–400)
PLATELETS: DECREASED 10*3/uL (ref 150–400)
RBC: 2.99 MIL/uL — ABNORMAL LOW (ref 4.22–5.81)
RBC: 3.06 MIL/uL — ABNORMAL LOW (ref 4.22–5.81)
RDW: 13.6 % (ref 11.5–15.5)
RDW: 13.6 % (ref 11.5–15.5)
WBC: 4.6 10*3/uL (ref 4.0–10.5)
WBC: 5.9 10*3/uL (ref 4.0–10.5)

## 2015-08-10 LAB — COMPREHENSIVE METABOLIC PANEL
ALT: 9 U/L — ABNORMAL LOW (ref 17–63)
AST: 20 U/L (ref 15–41)
Albumin: 3.1 g/dL — ABNORMAL LOW (ref 3.5–5.0)
Alkaline Phosphatase: 39 U/L (ref 38–126)
Anion gap: 9 (ref 5–15)
BILIRUBIN TOTAL: 2.1 mg/dL — AB (ref 0.3–1.2)
BUN: 39 mg/dL — AB (ref 6–20)
CHLORIDE: 77 mmol/L — AB (ref 101–111)
CO2: 43 mmol/L — ABNORMAL HIGH (ref 22–32)
CREATININE: 1.71 mg/dL — AB (ref 0.61–1.24)
Calcium: 8.6 mg/dL — ABNORMAL LOW (ref 8.9–10.3)
GFR, EST AFRICAN AMERICAN: 40 mL/min — AB (ref >60)
GFR, EST NON AFRICAN AMERICAN: 35 mL/min — AB (ref >60)
Glucose, Bld: 103 mg/dL — ABNORMAL HIGH (ref 65–99)
POTASSIUM: 4.6 mmol/L (ref 3.5–5.1)
Sodium: 129 mmol/L — ABNORMAL LOW (ref 135–145)
TOTAL PROTEIN: 5 g/dL — AB (ref 6.5–8.1)

## 2015-08-10 LAB — URINALYSIS, ROUTINE W REFLEX MICROSCOPIC
GLUCOSE, UA: NEGATIVE mg/dL
Ketones, ur: NEGATIVE mg/dL
LEUKOCYTES UA: NEGATIVE
Nitrite: NEGATIVE
PH: 5 (ref 5.0–8.0)
Protein, ur: 100 mg/dL — AB
SPECIFIC GRAVITY, URINE: 1.015 (ref 1.005–1.030)
Urobilinogen, UA: 1 mg/dL (ref 0.0–1.0)

## 2015-08-10 LAB — MRSA PCR SCREENING: MRSA by PCR: NEGATIVE

## 2015-08-10 LAB — URINE MICROSCOPIC-ADD ON

## 2015-08-10 LAB — I-STAT TROPONIN, ED: TROPONIN I, POC: 0.04 ng/mL (ref 0.00–0.08)

## 2015-08-10 LAB — I-STAT CHEM 8, ED
BUN: 42 mg/dL — ABNORMAL HIGH (ref 6–20)
CALCIUM ION: 1.13 mmol/L (ref 1.13–1.30)
CREATININE: 1.6 mg/dL — AB (ref 0.61–1.24)
Chloride: 78 mmol/L — ABNORMAL LOW (ref 101–111)
GLUCOSE: 115 mg/dL — AB (ref 65–99)
HCT: 34 % — ABNORMAL LOW (ref 39.0–52.0)
HEMOGLOBIN: 11.6 g/dL — AB (ref 13.0–17.0)
POTASSIUM: 4.6 mmol/L (ref 3.5–5.1)
Sodium: 129 mmol/L — ABNORMAL LOW (ref 135–145)
TCO2: 44 mmol/L (ref 0–100)

## 2015-08-10 LAB — BRAIN NATRIURETIC PEPTIDE: B Natriuretic Peptide: 891.3 pg/mL — ABNORMAL HIGH (ref 0.0–100.0)

## 2015-08-10 MED ORDER — FUROSEMIDE 10 MG/ML IJ SOLN
40.0000 mg | Freq: Once | INTRAMUSCULAR | Status: AC
  Administered 2015-08-10: 40 mg via INTRAVENOUS
  Filled 2015-08-10: qty 4

## 2015-08-10 MED ORDER — SIMVASTATIN 20 MG PO TABS
20.0000 mg | ORAL_TABLET | Freq: Every day | ORAL | Status: DC
  Administered 2015-08-10 – 2015-08-14 (×5): 20 mg via ORAL
  Filled 2015-08-10 (×5): qty 1

## 2015-08-10 MED ORDER — ASPIRIN 81 MG PO CHEW
81.0000 mg | CHEWABLE_TABLET | Freq: Every day | ORAL | Status: DC
  Administered 2015-08-10 – 2015-08-24 (×13): 81 mg via ORAL
  Filled 2015-08-10 (×15): qty 1

## 2015-08-10 MED ORDER — LEVOFLOXACIN IN D5W 750 MG/150ML IV SOLN: 750.0000 mg | INTRAVENOUS | Status: DC

## 2015-08-10 MED ORDER — TAMSULOSIN HCL 0.4 MG PO CAPS
0.4000 mg | ORAL_CAPSULE | Freq: Every day | ORAL | Status: DC
  Administered 2015-08-10 – 2015-08-14 (×4): 0.4 mg via ORAL
  Filled 2015-08-10 (×6): qty 1

## 2015-08-10 MED ORDER — METOPROLOL SUCCINATE ER 100 MG PO TB24
100.0000 mg | ORAL_TABLET | Freq: Every day | ORAL | Status: DC
Start: 2015-08-10 — End: 2015-08-12
  Administered 2015-08-10 – 2015-08-11 (×2): 100 mg via ORAL
  Filled 2015-08-10 (×3): qty 1

## 2015-08-10 MED ORDER — FUROSEMIDE 10 MG/ML IJ SOLN: 60.0000 mg | Freq: Two times a day (BID) | INTRAMUSCULAR | Status: DC

## 2015-08-10 MED ORDER — METHYLPREDNISOLONE SODIUM SUCC 125 MG IJ SOLR
60.0000 mg | INTRAMUSCULAR | Status: DC
  Administered 2015-08-11 – 2015-08-13 (×3): 60 mg via INTRAVENOUS
  Filled 2015-08-10 (×3): qty 2

## 2015-08-10 MED ORDER — HEPARIN SODIUM (PORCINE) 5000 UNIT/ML IJ SOLN
5000.0000 [IU] | Freq: Three times a day (TID) | INTRAMUSCULAR | Status: DC
  Administered 2015-08-10 – 2015-08-11 (×4): 5000 [IU] via SUBCUTANEOUS
  Filled 2015-08-10 (×4): qty 1

## 2015-08-10 MED ORDER — ISOSORBIDE MONONITRATE ER 60 MG PO TB24
60.0000 mg | ORAL_TABLET | Freq: Every day | ORAL | Status: DC
  Administered 2015-08-10 – 2015-08-11 (×2): 60 mg via ORAL
  Filled 2015-08-10 (×3): qty 1

## 2015-08-10 MED ORDER — FUROSEMIDE 10 MG/ML IJ SOLN
60.0000 mg | Freq: Two times a day (BID) | INTRAMUSCULAR | Status: DC
  Administered 2015-08-10: 60 mg via INTRAVENOUS
  Filled 2015-08-10 (×3): qty 6

## 2015-08-10 MED ORDER — LEVOFLOXACIN IN D5W 750 MG/150ML IV SOLN
750.0000 mg | INTRAVENOUS | Status: DC
  Administered 2015-08-11: 750 mg via INTRAVENOUS
  Filled 2015-08-10: qty 150

## 2015-08-10 MED ORDER — ONDANSETRON HCL 4 MG/2ML IJ SOLN: 4.0000 mg | Freq: Four times a day (QID) | INTRAMUSCULAR | Status: DC | PRN

## 2015-08-10 MED ORDER — METHYLPREDNISOLONE SODIUM SUCC 125 MG IJ SOLR
60.0000 mg | Freq: Four times a day (QID) | INTRAMUSCULAR | Status: DC
  Administered 2015-08-10 (×3): 60 mg via INTRAVENOUS
  Filled 2015-08-10 (×3): qty 2

## 2015-08-10 MED ORDER — CLOPIDOGREL BISULFATE 75 MG PO TABS
75.0000 mg | ORAL_TABLET | Freq: Every day | ORAL | Status: DC
  Administered 2015-08-10 – 2015-08-24 (×13): 75 mg via ORAL
  Filled 2015-08-10 (×17): qty 1

## 2015-08-10 MED ORDER — VANCOMYCIN HCL IN DEXTROSE 1-5 GM/200ML-% IV SOLN
1000.0000 mg | Freq: Once | INTRAVENOUS | Status: AC
  Administered 2015-08-10: 1000 mg via INTRAVENOUS
  Filled 2015-08-10: qty 200

## 2015-08-10 MED ORDER — LEVOFLOXACIN IN D5W 500 MG/100ML IV SOLN
500.0000 mg | Freq: Once | INTRAVENOUS | Status: AC
  Administered 2015-08-10: 500 mg via INTRAVENOUS
  Filled 2015-08-10: qty 100

## 2015-08-10 MED ORDER — AMLODIPINE BESYLATE 5 MG PO TABS: 5.0000 mg | ORAL_TABLET | Freq: Every day | ORAL | Status: DC

## 2015-08-10 MED ORDER — ACETAMINOPHEN 325 MG PO TABS
650.0000 mg | ORAL_TABLET | Freq: Four times a day (QID) | ORAL | Status: DC | PRN
  Administered 2015-08-16 – 2015-08-17 (×2): 650 mg via ORAL
  Filled 2015-08-10 (×3): qty 2

## 2015-08-10 MED ORDER — SODIUM CHLORIDE 0.9 % IJ SOLN
3.0000 mL | Freq: Two times a day (BID) | INTRAMUSCULAR | Status: DC
  Administered 2015-08-10 – 2015-08-24 (×27): 3 mL via INTRAVENOUS

## 2015-08-10 MED ORDER — DM-GUAIFENESIN ER 30-600 MG PO TB12
1.0000 | ORAL_TABLET | Freq: Two times a day (BID) | ORAL | Status: DC
Start: 2015-08-10 — End: 2015-08-16
  Administered 2015-08-10 – 2015-08-16 (×10): 1 via ORAL
  Filled 2015-08-10 (×11): qty 1

## 2015-08-10 MED ORDER — ACETAMINOPHEN 650 MG RE SUPP: 650.0000 mg | Freq: Four times a day (QID) | RECTAL | Status: DC | PRN

## 2015-08-10 MED ORDER — RANOLAZINE ER 500 MG PO TB12
500.0000 mg | ORAL_TABLET | Freq: Two times a day (BID) | ORAL | Status: DC
  Administered 2015-08-10 – 2015-08-13 (×6): 500 mg via ORAL
  Filled 2015-08-10 (×7): qty 1

## 2015-08-10 MED ORDER — ONDANSETRON HCL 4 MG PO TABS: 4.0000 mg | ORAL_TABLET | Freq: Four times a day (QID) | ORAL | Status: DC | PRN

## 2015-08-10 MED ORDER — IPRATROPIUM-ALBUTEROL 0.5-2.5 (3) MG/3ML IN SOLN
3.0000 mL | Freq: Four times a day (QID) | RESPIRATORY_TRACT | Status: DC
  Administered 2015-08-10 – 2015-08-11 (×6): 3 mL via RESPIRATORY_TRACT
  Filled 2015-08-10 (×7): qty 3

## 2015-08-10 NOTE — H&P (Signed)
[facility] History and Physical  Patient: Bill Walker  MRN: [id number]  DOB: 03-26-1928  DOS: the patient was seen and examined on 08/10/2015 PCP: Roxy Manns, MD  Referring physician: Dr. Nicanor Alcon Chief Complaint: Shortness of breath  HPI: Bill Walker is a 81 y.o. male with Past medical history of COPD chronic respiratory failure on chronic oxygen, coronary artery disease, essential hypertension, chronic systolic CHF, dyslipidemia, chronic kidney disease. The patient was brought in by wife as he has been progressively declining over last few months. The patient has loss of appetite along with loss of weight. Wife mentions patient has lost 60 pounds of weight. Patient denies any nausea vomiting or constipation or abdominal pain. No also diarrhea. Patient has been having chronic cough. No fever or chills. Patient has been more tired and lethargic. No chest pain or no fall recently. But since his recent admission in June patient has 3 falls are clear. Patient is hard of hearing therefore unable to provide any significant history. The history was taken with the help of patient's wife who is a primary caregiver for the patient. No recent change in his medications reported. No recent travel and no sick contacts.  The patient is coming from home  At his baseline ambulates with walker And is dependent for most of his ADL; does not manages his medication on his own.  Review of Systems: as mentioned in the history of present illness.  A comprehensive review of the other systems is negative.  Past Medical History  Diagnosis Date  . Ischemic cardiomyopathy     a. 02/2005 EF 32% (myoview report).  . Hypertension   . Coronary artery disease     a. s/p prior MI w/ RCA stenting;  b. 03/2011 Cath: LM 20d, LAD 40p/100p, LCX 40p, L->L collats to distal LAD, RCA dominant, patent prox stent, 24m ISR, 50-24m, 50-60d, 60d-->Med Rx;  c. 04/2015 NSTEMI/Cath: LM 45, LAD 80/83m, 100d (L->L  collats), D2 99ost, OM1 60, RCA patent stent prox, 65m, 70d, RPAV1/2 70->Med Rx.  Marland Kitchen Hyperlipidemia   . Vitamin B12 deficiency   . History of pneumonia   . Thrombocytopenia   . Cor pulmonale   . Hearing loss   . Alcohol abuse, unspecified   . Tobacco use disorder   . CKD (chronic kidney disease), stage III   . Osteoarthritis   . Gout   . Diverticulosis of colon   . COPD (chronic obstructive pulmonary disease)   . Anemia   . Bronchiectasis     CT Chest 07/07/09 bilateral lower lobe medial bronchiectasis  . Chronic systolic CHF (congestive heart failure)     a. 02/2005 EF 32% (myoview report).   Past Surgical History  Procedure Laterality Date  . Cardiac catheterization      (catheterization 2006 demonstrated an   occluded LAD.  There is a proximal stent in the RCA which was   patent.  He had distal 75% and 95% RCA lesions.  Initially it was  thought that he would have PCI but he was managed medically).  . Mri      Lumbar disk disease 06/1999  . Esophagogastroduodenoscopy      gastritis 12/1999  . Cardiac catheterization N/A 05/04/2015    Procedure: Left Heart Cath and Coronary Angiography;  Surgeon: Lennette Bihari, MD; LAD under percent, D2 99%, CFX patent, OM1 60% RCA 70%, previous stent is patent, no LV gram, 100 mL dye used   Social History:  reports that he quit smoking about 18  years ago. He does not have any smokeless tobacco history on file. He reports that he does not drink alcohol or use illicit drugs.  Allergies  Allergen Reactions  . Doxazosin Mesylate Rash    rash  . Penicillins Other (See Comments)    REACTION: unknown reaction    Family History  Problem Relation Age of Onset  . Lymphoma Brother   . Prostate cancer Brother     Prior to Admission medications   Medication Sig Start Date End Date Taking? Authorizing Provider  amLODipine (NORVASC) 5 MG tablet Take 5 mg by mouth daily. 07/02/15  Yes Historical Provider, MD  aspirin 81 MG tablet Take 1 tablet (81 mg  total) by mouth daily. 05/09/15  Yes Ok Anis, NP  calcium carbonate (TUMS - DOSED IN MG ELEMENTAL CALCIUM) 500 MG chewable tablet Chew 1 tablet by mouth daily.   Yes Historical Provider, MD  cholecalciferol (VITAMIN D) 1000 UNITS tablet Take 1 tablet (1,000 Units total) by mouth daily. 04/01/13  Yes Wendall Stade, MD  clopidogrel (PLAVIX) 75 MG tablet TAKE ONE TABLET BY MOUTH ONCE DAILY 07/15/15  Yes Judy Pimple, MD  diphenhydrAMINE (SOMINEX) 25 MG tablet Take 50 mg by mouth at bedtime as needed for sleep.    Yes Historical Provider, MD  furosemide (LASIX) 80 MG tablet TAKE 1 TABLET BY MOUTH DAILY & TAKE 1/2 TAB BY MOUTH IN THE EVENINGS ON Monday, Wed & Friday ONLY.   Yes Historical Provider, MD  isosorbide mononitrate (IMDUR) 30 MG 24 hr tablet TAKE TWO TABLETS BY MOUTH ONCE DAILY 06/21/15  Yes Judy Pimple, MD  metoprolol succinate (TOPROL-XL) 100 MG 24 hr tablet Take 1 tablet (100 mg total) by mouth daily. Take with or immediately following a meal. 07/14/15  Yes Wendall Stade, MD  nitroGLYCERIN (NITROSTAT) 0.4 MG SL tablet Place 0.4 mg under the tongue every 5 (five) minutes as needed for chest pain (up to 3 doses).   Yes Historical Provider, MD  ranolazine (RANEXA) 500 MG 12 hr tablet Take 1 tablet (500 mg total) by mouth 2 (two) times daily. 05/09/15  Yes Ok Anis, NP  simvastatin (ZOCOR) 20 MG tablet TAKE ONE TABLET BY MOUTH AT BEDTIME 07/05/15  Yes Judy Pimple, MD  tamsulosin (FLOMAX) 0.4 MG CAPS capsule Take 1 capsule (0.4 mg total) by mouth daily. 04/27/15  Yes Judy Pimple, MD  tiotropium (SPIRIVA HANDIHALER) 18 MCG inhalation capsule INHALE ONE CAPSULE BY MOUTH ONCE DAILY 02/08/15  Yes Judy Pimple, MD  vitamin B-12 (CYANOCOBALAMIN) 1000 MCG tablet Take 1,000 mcg by mouth daily.   Yes Historical Provider, MD  NON FORMULARY Oxygen- 3 lpm 24/7    Historical Provider, MD    Physical Exam: Filed Vitals:   08/10/15 0200 08/10/15 0215 08/10/15 0330 08/10/15 0345  BP:  122/49 124/68  121/73  Pulse: 87  91 85  Temp:      TempSrc:      Resp: 21 17 19 20   Height:      Weight:      SpO2: 100%  100% 100%    General: Alert, Awake and oriented to Person. Appear in moderate distress Eyes: PERRL ENT: Oral Mucosa clear moist. Neck: no JVD Cardiovascular: S1 and S2 Present, no Murmur, Peripheral Pulses Present Respiratory: Bilateral Air entry equal and Decreased,  Bibasilar Crackles, no wheezes Abdomen: Bowel Sound present, Soft and no tenderness Skin: no Rash Extremities: Bilateral Pedal edema, no calf tenderness Neurologic: Grossly no  focal neuro deficit.  Labs on Admission:  CBC:  Recent Labs Lab 08/10/15 0001 08/10/15 0015  WBC 4.6  --   NEUTROABS 3.6  --   HGB 10.4* 11.6*  HCT 32.1* 34.0*  MCV 107.4*  --   PLT 54*  --     CMP     Component Value Date/Time   NA 129* 08/10/2015 0015   NA 145 12/16/2009 1105   K 4.6 08/10/2015 0015   K 4.2 12/16/2009 1105   CL 78* 08/10/2015 0015   CL 102 12/16/2009 1105   CO2 45* 07/20/2015 1724   CO2 35* 12/16/2009 1105   GLUCOSE 115* 08/10/2015 0015   GLUCOSE 119* 12/16/2009 1105   BUN 42* 08/10/2015 0015   BUN 27* 12/16/2009 1105   CREATININE 1.60* 08/10/2015 0015   CREATININE 1.7* 12/16/2009 1105   CALCIUM 9.1 07/20/2015 1724   CALCIUM 8.5 12/16/2009 1105   PROT 5.3* 04/27/2015 1001   PROT 5.8* 03/17/2009 1301   ALBUMIN 2.2* 05/09/2015 0350   AST 16 04/27/2015 1001   AST 21 03/17/2009 1301   ALT 7 04/27/2015 1001   ALT 9* 03/17/2009 1301   ALKPHOS 43 04/27/2015 1001   ALKPHOS 61 03/17/2009 1301   BILITOT 0.8 04/27/2015 1001   BILITOT 0.80 03/17/2009 1301   GFRNONAA 32* 05/09/2015 0350   GFRAA 37* 05/09/2015 0350    No results for input(s): CKTOTAL, CKMB, CKMBINDEX, TROPONINI in the last 168 hours. BNP (last 3 results)  Recent Labs  05/03/15 1450 08/10/15 0001  BNP 379.7* 891.3*    ProBNP (last 3 results)  Recent Labs  07/20/15 1724  PROBNP 856.0*     Radiological  Exams on Admission: Dg Chest 2 View  08/10/2015   CLINICAL DATA:  Shortness of breath  EXAM: CHEST  2 VIEW  COMPARISON:  07/20/2015  FINDINGS: Progressive bibasilar opacity suggesting pneumonia.  Small bilateral pleural effusion. Interstitial coarsening with cephalized blood flow, increased from prior.  Chronic cardiomegaly. Background of hyperinflation bronchitic changes.  IMPRESSION: 1. Progressive bibasilar airspace disease, primarily concerning for pneumonia. 2. Superimposed CHF pattern. 3. Background COPD.   Electronically Signed   By: Marnee Spring M.D.   On: 08/10/2015 00:53   Ct Head Wo Contrast  08/10/2015   CLINICAL DATA:  Increasing shortness of breath, weakness and decreased urinary output. Oxygen desaturation. History of alcohol and tobacco use, chronic kidney disease, thrombocytopenia, hypertension.  EXAM: CT HEAD WITHOUT CONTRAST  TECHNIQUE: Contiguous axial images were obtained from the base of the skull through the vertex without intravenous contrast.  COMPARISON:  CT head May 05, 2015  FINDINGS: Moderate to severe ventriculomegaly, on the basis of global parenchymal brain volume loss, unchanged from prior imaging. No intraparenchymal hemorrhage, mass effect nor midline shift. Patchy supratentorial white matter hypodensities are less than expected for patient's age and though non-specific suggest sequelae of chronic small vessel ischemic disease. No acute large vascular territory infarcts.  No abnormal extra-axial fluid collections. Basal cisterns are patent. Moderate calcific atherosclerosis of the carotid siphons.  No skull fracture. The included ocular globes and orbital contents are non-suspicious. Status post bilateral ocular lens implants. Near complete opacification LEFT sphenoid sinus, small RIGHT sphenoid sinus air-fluid level.  IMPRESSION: No acute intracranial process.  Stable chronic changes including moderate to severe global brain atrophy.  Worsening LEFT greater than RIGHT  sphenoid sinusitis.   Electronically Signed   By: Awilda Metro M.D.   On: 08/10/2015 00:31   EKG: Independently reviewed. normal sinus rhythm,  nonspecific ST and T waves changes.  Assessment/Plan 1. Acute on chronic respiratory failure The patient is presenting with numbness of fatigue and lethargy. Next and workup shows a reduction this significantly hypoxic. ABG shows the patient has hypercarbia with respiratory acidosis. This is suggestive of possible COPD flareup along with ongoing pneumonia based on the chest x-ray. With his multifactorial acute on chronic respiratory failure the patient will be admitted in the step down unit. I would treat him with Solu-Medrol, duo nebs, levofloxacin for a possible COPD flareup. This will also cover him with community-acquired pneumonia. Blood culture will be also ordered. Follow cultures.  2.Essential hypertension Continuing home medications. Continue close monitoring.  3  Coronary atherosclerosis With the symptoms ongoing for more than 2 months with gradual decline, his current presentation Does not appear to be any cardiac event at present. Extremities continue aspirin and Plavix.  4  BPH (benign prostatic hyperplasia) Continue Flomax.  5  COPD (chronic obstructive pulmonary disease) Holding inhaler since I'm placing him on DuoNeb's.  6  Acute on chronic systolic CHF (congestive heart failure) Patient is also appearing to have possible acute on chronic CHF flareup based on the chest x-ray as well as elevated BNP as well as pedal edema. The patient has received 40 mg of IV Lasix in the ER. We will monitor his response as well as daily BMP and decide further diuresis in the morning. Monitor ins and outs and daily weight.  7  Dyslipidemia Continue statin.  8  Anorexia Loss of weight Protein calorie malnutrition  Currently patient will be on Solu-Medrol. Later on patient can be placed on Megace to improve her appetite. Dietetic  consultation in the morning.  Nutrition: Heart healthy diet DVT Prophylaxis: subcutaneous Heparin  Advance goals of care discussion: Full code as per my discussion with patient's wife was the primary caregiver   Family Communication: family was present at bedside, opportunity was given to ask question and all questions were answered satisfactorily at the time of interview. Disposition: Admitted as inpatient, step-down unit. Estimated length of stay: 2-3 days  Author: Lynden Oxford, MD [facility] Pager: [phone] 08/10/2015  If 7PM-7AM, please contact night-coverage [email or web address] Password [email or web address]

## 2015-08-10 NOTE — Progress Notes (Signed)
[facility] TEAM 1 - Stepdown/ICU TEAM Progress Note  Bill Walker ZOX:[id number] DOB: [birth year] DOA: 08/09/2015 PCP: Roxy Manns, MD  Admit HPI / Brief Narrative: Bill Walker is a 81 y.o.WM PMHx Cronic Rspiratory Filure on 3 L O2 at home,COPD, , CAD native artery, Essential HTN, Cronic Sstolic CHF, dyslipidemia, CKD.  The patient was brought in by wife as he has been progressively declining over last few months. The patient has loss of appetite along with loss of weight. Wife mentions patient has lost 60 pounds of weight. Patient denies any nausea vomiting or constipation or abdominal pain. No also diarrhea. Patient has been having chronic cough. No fever or chills. Patient has been more tired and lethargic. No chest pain or no fall recently. But since his recent admission in June patient has 3 falls are clear. Patient is hard of hearing therefore unable to provide any significant history. The history was taken with the help of patient's wife who is a primary caregiver for the patient. No recent change in his medications reported. No recent travel and no sick contacts.  The patient is coming from home  At his baseline ambulates with walker And is dependent for most of his ADL; does not manages his medication on his own.  HPI/Subjective: 9/27 A/O 3 (does not know where), however knows he is in [place]  Assessment/Plan: Acute on chronic respiratory failure/CAP -Per wife is on 3 L O2 via [place] at home 24 hours a day -After obtaining ABG DC BiPAP monitor closely for AMS, respiratory distress. Restart BiPAP if necessary -Patient and family would like to try on 3 L [place]  -Levofloxacin 750 mg  COPD (chronic obstructive pulmonary disease) -DuoNeb QID -Solu-Medrol 60 mg daily -Flutter valve  Respiratory acidosis with hypercarbia . -This is suggestive of possible COPD flareup along with ongoing pneumonia based on the chest x-ray. With his multifactorial acute on chronic  respiratory failure the patient will be admitted in the step down unit. I would treat him with Solu-Medrol, duo nebs, levofloxacin for a possible COPD flareup. This will also cover him with community-acquired pneumonia. Blood culture will be also ordered. Follow cultures. -Repeat ABG significantly improved after speaking with patient and family will try patient off BiPAP  Acute on chronic systolic CHF (congestive heart failure) -Strict I&O -Daily weight standing -Lasix 60 mg BID -Imdur 60 mg daily -Metoprolol 100 mg daily  Pulmonary hypertension -See CHF  Pulmonary edema -See CHF  Essential hypertension -See CHF  CAD native artery -With the symptoms ongoing for more than 2 months with gradual decline, his current presentation Does not appear to be any cardiac event at present. Extremities continue aspirin and Plavix.  BPH (benign prostatic hyperplasia) Continue Flomax.   Dyslipidemia Continue statin.  Protein calorie malnutrition -Heart healthy diet when off BiPAP    Code Status: FULL Family Communication: family present at time of exam Disposition Plan: Patient and family meeting with palliative care on Thursday    Consultants:   Procedure/Significant Events: 7/5 echocardiogram;- Left ventricle: mild focalbasal hypertrophy of the septum.-LVEF=35%- 40%. akinesis of the apicalanteroseptal, anterolateral, and apical myocardium. - Left atrium: moderately dilated.- Pulmonary arteries: PA peak pressure: 49 mm Hg (S).   Culture   Antibiotics: Levofloxacin 9/27>>  DVT prophylaxis: subcutaneous Heparin    Devices    LINES / TUBES:      Continuous Infusions:   Objective: VITAL SIGNS: Temp: 99 F (37.2 C) (09/27 1926) Temp Source: Oral (09/27 1926) BP: 109/65 mmHg (09/27 1900) Pulse Rate:  132 (09/27 1900) SPO2; FIO2:   Intake/Output Summary (Last 24 hours) at 08/10/15 2034 Last data filed at 08/10/15 1800  Gross per 24 hour  Intake      0  ml  Output    450 ml  Net   -450 ml     Exam: General: A/O 3 (does not know where), , however knows he is in [place]  Acute on chronic respiratory failure, currently on BiPAP  Eyes: Negative headache, eye pain, double vision,negative scleral hemorrhage ENT: Negative Runny nose, negative ear pain, negative gingival bleeding, Neck:  Negative scars, masses, torticollis, lymphadenopathy, JVD Lungs: diffuse decreased breath sounds, negative wheezes or crackles Cardiovascular: Irregular irregular rhythm and rate without murmur gallop or rub normal S1 and S2 Abdomen:negative abdominal pain, nondistended, positive soft, bowel sounds, no rebound, no ascites, no appreciable mass Extremities: No significant cyanosis, clubbing, positive bilateral pedal edema to hips 2-3+ Psychiatric:  Negative depression, negative anxiety, negative fatigue, negative mania  Neurologic:  Cranial nerves II through XII intact, tongue/uvula midline, all extremities muscle strength 5/5, sensation intact throughout, negative dysarthria, negative expressive aphasia, negative receptive aphasia.   Data Reviewed: Basic Metabolic Panel:  Recent Labs Lab 08/10/15 0015 08/10/15 0649  NA 129* 129*  K 4.6 4.6  CL 78* 77*  CO2  --  43*  GLUCOSE 115* 103*  BUN 42* 39*  CREATININE 1.60* 1.71*  CALCIUM  --  8.6*   Liver Function Tests:  Recent Labs Lab 08/10/15 0649  AST 20  ALT 9*  ALKPHOS 39  BILITOT 2.1*  PROT 5.0*  ALBUMIN 3.1*   No results for input(s): LIPASE, AMYLASE in the last 168 hours. No results for input(s): AMMONIA in the last 168 hours. CBC:  Recent Labs Lab 08/10/15 0001 08/10/15 0015 08/10/15 0649  WBC 4.6  --  5.9  NEUTROABS 3.6  --  4.6  HGB 10.4* 11.6* 10.5*  HCT 32.1* 34.0* 32.7*  MCV 107.4*  --  106.9*  PLT 54*  --  PLATELET CLUMPS NOTED ON SMEAR, COUNT APPEARS DECREASED   Cardiac Enzymes: No results for input(s): CKTOTAL, CKMB, CKMBINDEX, TROPONINI in the last 168  hours. BNP (last 3 results)  Recent Labs  05/03/15 1450 08/10/15 0001  BNP 379.7* 891.3*    ProBNP (last 3 results)  Recent Labs  07/20/15 1724  PROBNP 856.0*    CBG: No results for input(s): GLUCAP in the last 168 hours.  Recent Results (from the past 240 hour(s))  MRSA PCR Screening     Status: None   Collection Time: 08/10/15  4:35 AM  Result Value Ref Range Status   MRSA by PCR NEGATIVE NEGATIVE Final    Comment:        The GeneXpert MRSA Assay (FDA approved for NASAL specimens only), is one component of a comprehensive MRSA colonization surveillance program. It is not intended to diagnose MRSA infection nor to guide or monitor treatment for MRSA infections.      Studies:  Recent x-ray studies have been reviewed in detail by the Attending Physician  Scheduled Meds:  Scheduled Meds: . aspirin  81 mg Oral Daily  . clopidogrel  75 mg Oral Daily  . furosemide  60 mg Intravenous BID  . heparin  5,000 Units Subcutaneous 3 times per day  . ipratropium-albuterol  3 mL Nebulization Q6H  . isosorbide mononitrate  60 mg Oral Daily  . [START ON 08/11/2015] levofloxacin (LEVAQUIN) IV  750 mg Intravenous Q48H  . [START ON 08/11/2015] methylPREDNISolone (SOLU-MEDROL)  injection  60 mg Intravenous Q24H  . metoprolol succinate  100 mg Oral Daily  . ranolazine  500 mg Oral BID  . simvastatin  20 mg Oral QHS  . sodium chloride  3 mL Intravenous Q12H  . tamsulosin  0.4 mg Oral Daily    Time spent on care of this patient: 40 mins   WOODS, Roselind Messier , MD  [facility] Office  [phone] Pager - [phone]  On-Call/Text Page:      [email or web address]      password [email or web address]  If 7PM-7AM, please contact night-coverage [email or web address] Password [facility] 08/10/2015, 8:34 PM   LOS: 0 days   Care during the described time interval was provided by me .  I have reviewed this patient's available data, including medical history, events of note, physical examination, and all test  results as part of my evaluation. I have personally reviewed and interpreted all radiology studies.   Carolyne Littles, MD [phone] Pager

## 2015-08-10 NOTE — ED Notes (Signed)
Respiratory @ bedside

## 2015-08-10 NOTE — Progress Notes (Signed)
ANTIBIOTIC CONSULT NOTE - INITIAL  Pharmacy Consult for Levaquin Indication: CAP  Allergies  Allergen Reactions  . Doxazosin Mesylate Rash    rash  . Penicillins Other (See Comments)    REACTION: unknown reaction    Patient Measurements: Height:  (185.4 cm) Weight: 184 lb (83.462 kg) IBW/kg (Calculated) : 79.9  Vital Signs: Temp: 98.4 F (36.9 C) (09/26 2337) Temp Source: Oral (09/26 2337) BP: 121/73 mmHg (09/27 0345) Pulse Rate: 85 (09/27 0345) Intake/Output from previous day: 09/26 0701 - 09/27 0700 In: -  Out: 100 [Urine:100] Intake/Output from this shift: Total I/O In: -  Out: 100 [Urine:100]  Labs:  Recent Labs  08/10/15 0001 08/10/15 0015  WBC 4.6  --   HGB 10.4* 11.6*  PLT 54*  --   CREATININE  --  1.60*   Estimated Creatinine Clearance: 38.1 mL/min (by C-G formula based on Cr of 1.6). No results for input(s): VANCOTROUGH, VANCOPEAK, VANCORANDOM, GENTTROUGH, GENTPEAK, GENTRANDOM, TOBRATROUGH, TOBRAPEAK, TOBRARND, AMIKACINPEAK, AMIKACINTROU, AMIKACIN in the last 72 hours.   Microbiology: No results found for this or any previous visit (from the past 720 hour(s)).  Medical History: Past Medical History  Diagnosis Date  . Ischemic cardiomyopathy     a. 02/2005 EF 32% (myoview report).  . Hypertension   . Coronary artery disease     a. s/p prior MI w/ RCA stenting;  b. 03/2011 Cath: LM 20d, LAD 40p/100p, LCX 40p, L->L collats to distal LAD, RCA dominant, patent prox stent, 34m ISR, 50-23m, 50-60d, 60d-->Med Rx;  c. 04/2015 NSTEMI/Cath: LM 45, LAD 80/55m, 100d (L->L collats), D2 99ost, OM1 60, RCA patent stent prox, 54m, 70d, RPAV1/2 70->Med Rx.  Marland Kitchen Hyperlipidemia   . Vitamin B12 deficiency   . History of pneumonia   . Thrombocytopenia   . Cor pulmonale   . Hearing loss   . Alcohol abuse, unspecified   . Tobacco use disorder   . CKD (chronic kidney disease), stage III   . Osteoarthritis   . Gout   . Diverticulosis of colon   . COPD (chronic  obstructive pulmonary disease)   . Anemia   . Bronchiectasis     CT Chest 07/07/09 bilateral lower lobe medial bronchiectasis  . Chronic systolic CHF (congestive heart failure)     a. 02/2005 EF 32% (myoview report).    Medications:  Prescriptions prior to admission  Medication Sig Dispense Refill Last Dose  . amLODipine (NORVASC) 5 MG tablet Take 5 mg by mouth daily.   08/09/2015 at Unknown time  . aspirin 81 MG tablet Take 1 tablet (81 mg total) by mouth daily.   08/09/2015 at Unknown time  . calcium carbonate (TUMS - DOSED IN MG ELEMENTAL CALCIUM) 500 MG chewable tablet Chew 1 tablet by mouth daily.   08/09/2015 at Unknown time  . cholecalciferol (VITAMIN D) 1000 UNITS tablet Take 1 tablet (1,000 Units total) by mouth daily.   08/09/2015 at Unknown time  . clopidogrel (PLAVIX) 75 MG tablet TAKE ONE TABLET BY MOUTH ONCE DAILY 90 tablet 3 08/09/2015 at Unknown time  . diphenhydrAMINE (SOMINEX) 25 MG tablet Take 50 mg by mouth at bedtime as needed for sleep.    08/09/2015 at Unknown time  . furosemide (LASIX) 80 MG tablet TAKE 1 TABLET BY MOUTH DAILY & TAKE 1/2 TAB BY MOUTH IN THE EVENINGS ON Monday, Wed & Friday ONLY.   08/08/2015 at Unknown time  . isosorbide mononitrate (IMDUR) 30 MG 24 hr tablet TAKE TWO TABLETS BY MOUTH ONCE  DAILY 180 tablet 1 08/09/2015 at Unknown time  . metoprolol succinate (TOPROL-XL) 100 MG 24 hr tablet Take 1 tablet (100 mg total) by mouth daily. Take with or immediately following a meal. 90 tablet 3 08/09/2015 at 0800  . nitroGLYCERIN (NITROSTAT) 0.4 MG SL tablet Place 0.4 mg under the tongue every 5 (five) minutes as needed for chest pain (up to 3 doses).   unknown  . ranolazine (RANEXA) 500 MG 12 hr tablet Take 1 tablet (500 mg total) by mouth 2 (two) times daily. 60 tablet 6 08/09/2015 at Unknown time  . simvastatin (ZOCOR) 20 MG tablet TAKE ONE TABLET BY MOUTH AT BEDTIME 90 tablet 1 08/09/2015 at Unknown time  . tamsulosin (FLOMAX) 0.4 MG CAPS capsule Take 1 capsule (0.4 mg  total) by mouth daily. 90 capsule 3 08/09/2015 at Unknown time  . tiotropium (SPIRIVA HANDIHALER) 18 MCG inhalation capsule INHALE ONE CAPSULE BY MOUTH ONCE DAILY 90 capsule 3 08/09/2015 at Unknown time  . vitamin B-12 (CYANOCOBALAMIN) 1000 MCG tablet Take 1,000 mcg by mouth daily.   08/09/2015 at Unknown time  . NON FORMULARY Oxygen- 3 lpm 24/7   Taking   Assessment: 81 y.o. male presents with SOB. To begin Levaquin for CAP. Received Levaquin  IV ~0230. Afeb. WBC wnl. SCr 1.6, est CrCl 38 ml/min.  Goal of Therapy:  Resolution of infection  Plan:  Levaquin  IV q48h Will f/u renal function, micro data, and pt's clinical condition  Christoper Fabian, PharmD, BCPS Clinical pharmacist, pager [phone] 08/10/2015,4:40 AM

## 2015-08-10 NOTE — Progress Notes (Signed)
Notified Dr Joseph Art with the patients complaints of BIPAP mask being uncomfortable after many attempts to adjust it. Also notified of patients atrial fibrillation rhythm w/ the HR in 100-150's. Pt has low urinary output 150cc this am. Bladder scanner used to verify; resulting in 156 & 141. No new orders at this time. Awaiting palliative care consult before making any further decisions.   Dawson Bills, RN

## 2015-08-10 NOTE — Consult Note (Signed)
Consultation Note Date: 08/10/2015   Patient Name: Bill Walker  DOB: 1928/03/22  MRN: [id number]  Age / Sex: 81 y.o., male   PCP: Judy Pimple, MD Referring Physician: Drema Dallas, MD  Reason for Consultation: Establishing goals of care and Psychosocial/spiritual support  Palliative Care Assessment and Plan Summary of Established Goals of Care and Medical Treatment Preferences   Clinical Assessment/Narrative: Bill Walker is resting in bed wit BiPAP supporting his breathing.  His wife of 59 years is at his bedside.  He requests that I talk with his wife about his health. Mrs. Helfand tells me that Bill Walker  has been ill for the last week, unable to lie flat, and sleeping his his chair. We talk about CHF, COPD and CKD.  We also talk about Bill Walker memory loss.  She tells me that he was sent to [facility] place about 4.5 years ago, but only stayed for one week, then one week at [facility].  She tells me that she was given a sheet of paper with places for Bill Walker at discharge and she questioned why Hospice and was told that was the place where he could get care.  We talk about home life and Mrs. Sagar tells me that last week when Bill Walker got in the shower it was the last time, d/t her concern for falls and he being unable to use a shower chair.  She tells me that she does dress him, dt respiratory concerns, but that he can dress himself if needed.   She tells me that he has lost about  60 lbs in the last year.  She tells me about a recent visit to the cardiologist where they discussed intubation/ventilation, but they did not discuss and make a family decision.  I share the chronic illness trajectory and Mrs. Doss tells me that she can see the decline over the last few years,  She tells me about her worry in caring for Bill Walker at home.  She feels he will need to go to rehab, and may not be able to come home. We discuss the logistics of rehab placement.  I talk about HCPOA and AD, and when  I ask Bill Walker if he had ever made any decision about intubation, Mrs. Weberg tells me to not talk with him about this, because he is "not in any shape".  She does go on to tell me about an episode last week where he could not lie flat and told her "i'm dying".  She tells me that he has been sleeping in the chair since. We talk about choice they have for his care, and the importance of making these decisions with her sons.  We tentativly plan to meet on Thursday, with their son Bill Walker.  Mrs. Szwed tells me, "I dont think he would want a breathing tube", and talks about visiting her father in SNF daily for 4 years and seeing people there with feeding tubes who "didn't know where they were".  I encourage Mrs. Clayson to share our discussion with her sons and that we will be happy to meet with them on Thursday.   Contacts/Participants in Discussion: Primary Decision Maker: Bill Walker has some baseline confusion and therefor Mrs. Erling Walker is the primary Management consultant.    HCPOA: no    Code Status/Advance Care Planning:  FULL code  Chest compression, cardioversion, intubation: YES at this time.   Mrs. Erling Walker is requesting time to talk about these choices with  her sons.   She does state, "I dont think he would want a breathing tube", but is unable to decide at this time.   She tells me that they have never discussed end of life choices.   Most form discussed and given to wife.    Symptom Management:   Tylenol 650 mg PO/PR Q 6 hours PRN,  Zofran 4 mg PO/IV Q 6 hours PRN.   Palliative Prophylaxis: NONE recommend Senna-S 1 tab PO QD.   Psycho-social/Spiritual:   Support System: Lives with wife of 60 years, and grown son Bill Walker. Another son Bill Maduro.   Desire for further Chaplaincy support:  Not discussed today.   Prognosis: Unable to determine  Discharge Planning:  Skilled Nursing Facility for rehab with Palliative care service follow-up       Chief Complaint:  Shortness of breath History of  Present Illness:  Ayaz Walker is a 81 y.o. male with Past medical history of COPD chronic respiratory failure on chronic oxygen, coronary artery disease, essential hypertension, chronic systolic CHF, dyslipidemia, chronic kidney disease.  The patient was brought in by wife as he has been progressively declining over last few months.  The patient has loss of appetite along with loss of weight. Wife mentions patient has lost 60 pounds of weight over the last year. Patient has been having chronic cough, but no fever or chills.  Patient has been more tired and lethargic.  No chest pain or no fall recently, although reported 3 falls since last hospital admission. Patient is hard of hearing therefore unable to provide any significant history.   The history was taken with the help of patient's wife who is a primary caregiver for the patient.  No recent change in his medications reported. No recent travel and no sick contacts.  The patient is coming from home where he lives with his wife and son. At his baseline ambulates with walker and is dependent for most of his ADL; does not manages his medication on his own.  He is presently being supported with BiPAP.   Primary Diagnoses  Present on Admission:  . Acute on chronic respiratory failure . COPD (chronic obstructive pulmonary disease) . Essential hypertension . Chronic respiratory failure with hypercapnia . Acute on chronic systolic CHF (congestive heart failure) . Coronary atherosclerosis . Dyslipidemia . BPH (benign prostatic hyperplasia) . Anorexia . Loss of weight . CAP (community acquired pneumonia)  Palliative Review of Systems: Bill Walker denies pain, anxiety, or NV.  He does complain with the use of BiPAP.  I have reviewed the medical record, interviewed the patient and family, and examined the patient. The following aspects are pertinent.  Past Medical History  Diagnosis Date  . Ischemic cardiomyopathy     a. 02/2005 EF 32% (myoview  report).  . Hypertension   . Coronary artery disease     a. s/p prior MI w/ RCA stenting;  b. 03/2011 Cath: LM 20d, LAD 40p/100p, LCX 40p, L->L collats to distal LAD, RCA dominant, patent prox stent, 95m ISR, 50-55m, 50-60d, 60d-->Med Rx;  c. 04/2015 NSTEMI/Cath: LM 45, LAD 80/33m, 100d (L->L collats), D2 99ost, OM1 60, RCA patent stent prox, 34m, 70d, RPAV1/2 70->Med Rx.  Marland Kitchen Hyperlipidemia   . Vitamin B12 deficiency   . History of pneumonia   . Thrombocytopenia   . Cor pulmonale   . Hearing loss   . Alcohol abuse, unspecified   . Tobacco use disorder   . CKD (chronic kidney disease), stage III   . Osteoarthritis   .  Gout   . Diverticulosis of colon   . COPD (chronic obstructive pulmonary disease)   . Anemia   . Bronchiectasis     CT Chest 07/07/09 bilateral lower lobe medial bronchiectasis  . Chronic systolic CHF (congestive heart failure)     a. 02/2005 EF 32% (myoview report).   Social History   Social History  . Marital Status: Married    Spouse Name: N/A  . Number of Children: N/A  . Years of Education: N/A   Occupational History  . Retired    Social History Main Topics  . Smoking status: Former Smoker -- 1.00 packs/day for 48 years    Quit date: 11/13/1996  . Smokeless tobacco: None  . Alcohol Use: No  . Drug Use: No  . Sexual Activity: Not Currently   Other Topics Concern  . None   Social History Narrative   Lives in [place] with his wife      Retired      No regular exercise due to co-morbidities      Cannot walk long distnaces   Family History  Problem Relation Age of Onset  . Lymphoma Brother   . Prostate cancer Brother    Scheduled Meds: . aspirin  81 mg Oral Daily  . clopidogrel  75 mg Oral Daily  . heparin  5,000 Units Subcutaneous 3 times per day  . ipratropium-albuterol  3 mL Nebulization Q6H  . isosorbide mononitrate  60 mg Oral Daily  . [START ON 08/11/2015] levofloxacin (LEVAQUIN) IV  750 mg Intravenous Q48H  . methylPREDNISolone  (SOLU-MEDROL) injection  60 mg Intravenous 4 times per day  . metoprolol succinate  100 mg Oral Daily  . ranolazine  500 mg Oral BID  . simvastatin  20 mg Oral QHS  . sodium chloride  3 mL Intravenous Q12H  . tamsulosin  0.4 mg Oral Daily   Continuous Infusions:  PRN Meds:.acetaminophen **OR** acetaminophen, ondansetron **OR** ondansetron (ZOFRAN) IV Medications Prior to Admission:  Prior to Admission medications   Medication Sig Start Date End Date Taking? Authorizing Provider  amLODipine (NORVASC) 5 MG tablet Take 5 mg by mouth daily. 07/02/15  Yes Historical Provider, MD  aspirin 81 MG tablet Take 1 tablet (81 mg total) by mouth daily. 05/09/15  Yes Ok Anis, NP  calcium carbonate (TUMS - DOSED IN MG ELEMENTAL CALCIUM) 500 MG chewable tablet Chew 1 tablet by mouth daily.   Yes Historical Provider, MD  cholecalciferol (VITAMIN D) 1000 UNITS tablet Take 1 tablet (1,000 Units total) by mouth daily. 04/01/13  Yes Wendall Stade, MD  clopidogrel (PLAVIX) 75 MG tablet TAKE ONE TABLET BY MOUTH ONCE DAILY 07/15/15  Yes Judy Pimple, MD  diphenhydrAMINE (SOMINEX) 25 MG tablet Take 50 mg by mouth at bedtime as needed for sleep.    Yes Historical Provider, MD  furosemide (LASIX) 80 MG tablet TAKE 1 TABLET BY MOUTH DAILY & TAKE 1/2 TAB BY MOUTH IN THE EVENINGS ON Monday, Wed & Friday ONLY.   Yes Historical Provider, MD  isosorbide mononitrate (IMDUR) 30 MG 24 hr tablet TAKE TWO TABLETS BY MOUTH ONCE DAILY 06/21/15  Yes Judy Pimple, MD  metoprolol succinate (TOPROL-XL) 100 MG 24 hr tablet Take 1 tablet (100 mg total) by mouth daily. Take with or immediately following a meal. 07/14/15  Yes Wendall Stade, MD  nitroGLYCERIN (NITROSTAT) 0.4 MG SL tablet Place 0.4 mg under the tongue every 5 (five) minutes as needed for chest pain (up to 3  doses).   Yes Historical Provider, MD  ranolazine (RANEXA) 500 MG 12 hr tablet Take 1 tablet (500 mg total) by mouth 2 (two) times daily. 05/09/15  Yes Ok Anis, NP  simvastatin (ZOCOR) 20 MG tablet TAKE ONE TABLET BY MOUTH AT BEDTIME 07/05/15  Yes Judy Pimple, MD  tamsulosin (FLOMAX) 0.4 MG CAPS capsule Take 1 capsule (0.4 mg total) by mouth daily. 04/27/15  Yes Judy Pimple, MD  tiotropium (SPIRIVA HANDIHALER) 18 MCG inhalation capsule INHALE ONE CAPSULE BY MOUTH ONCE DAILY 02/08/15  Yes Judy Pimple, MD  vitamin B-12 (CYANOCOBALAMIN) 1000 MCG tablet Take 1,000 mcg by mouth daily.   Yes Historical Provider, MD  NON FORMULARY Oxygen- 3 lpm 24/7    Historical Provider, MD   Allergies  Allergen Reactions  . Doxazosin Mesylate Rash    rash  . Penicillins Other (See Comments)    REACTION: unknown reaction   CBC:    Component Value Date/Time   WBC 5.9 08/10/2015 0649   WBC 7.6 12/05/2012 1124   WBC 8.1 09/01/2010 1539   HGB 10.5* 08/10/2015 0649   HGB 10.5* 12/05/2012 1124   HGB 12.6* 09/01/2010 1539   HCT 32.7* 08/10/2015 0649   HCT 31.7* 12/05/2012 1124   HCT 37.4* 09/01/2010 1539   PLT  08/10/2015 0649    PLATELET CLUMPS NOTED ON SMEAR, COUNT APPEARS DECREASED   PLT 163 12/05/2012 1124   PLT 123* 09/01/2010 1539   MCV 106.9* 08/10/2015 0649   MCV 97.9 12/05/2012 1124   MCV 93 09/01/2010 1539   NEUTROABS 4.6 08/10/2015 0649   NEUTROABS 5.8 12/05/2012 1124   NEUTROABS 6.6* 09/01/2010 1539   LYMPHSABS 0.5* 08/10/2015 0649   LYMPHSABS 1.0 12/05/2012 1124   LYMPHSABS 0.8* 09/01/2010 1539   MONOABS 0.8 08/10/2015 0649   MONOABS 0.6 12/05/2012 1124   EOSABS 0.0 08/10/2015 0649   EOSABS 0.2 12/05/2012 1124   EOSABS 0.1 09/01/2010 1539   BASOSABS 0.0 08/10/2015 0649   BASOSABS 0.0 12/05/2012 1124   BASOSABS 0.0 09/01/2010 1539   Comprehensive Metabolic Panel:    Component Value Date/Time   NA 129* 08/10/2015 0649   NA 145 12/16/2009 1105   K 4.6 08/10/2015 0649   K 4.2 12/16/2009 1105   CL 77* 08/10/2015 0649   CL 102 12/16/2009 1105   CO2 43* 08/10/2015 0649   CO2 35* 12/16/2009 1105   BUN 39* 08/10/2015 0649   BUN  27* 12/16/2009 1105   CREATININE 1.71* 08/10/2015 0649   CREATININE 1.7* 12/16/2009 1105   GLUCOSE 103* 08/10/2015 0649   GLUCOSE 119* 12/16/2009 1105   CALCIUM 8.6* 08/10/2015 0649   CALCIUM 8.5 12/16/2009 1105   AST 20 08/10/2015 0649   AST 21 03/17/2009 1301   ALT 9* 08/10/2015 0649   ALT 9* 03/17/2009 1301   ALKPHOS 39 08/10/2015 0649   ALKPHOS 61 03/17/2009 1301   BILITOT 2.1* 08/10/2015 0649   BILITOT 0.80 03/17/2009 1301   PROT 5.0* 08/10/2015 0649   PROT 5.8* 03/17/2009 1301   ALBUMIN 3.1* 08/10/2015 0649    Physical Exam: Vital Signs: BP 124/68 mmHg  Pulse 114  Temp(Src) 98.5 F (36.9 C) (Axillary)  Resp 22  Ht  (1.803 m)  Wt 88.724 kg (195 lb 9.6 oz)  BMI 27.29 kg/m2  SpO2 96% SpO2: SpO2: 96 % O2 Device: O2 Device: Bi-PAP O2 Flow Rate:   Intake/output summary:  Intake/Output Summary (Last 24 hours) at 08/10/15 1501 Last data filed at  08/10/15 1030  Gross per 24 hour  Intake      0 ml  Output    250 ml  Net   -250 ml   LBM: Last BM Date: 08/09/15 Baseline Weight: Weight: 83.462 kg (184 lb) Most recent weight: Weight: 88.724 kg (195 lb 9.6 oz)  Exam Findings:  Constitutional: elderly frail, lying in bed Resp:  Even and non labored, with use of BiPAP Cardio: A fib, rate 110-150's.  GI: Abd soft non tender Psych: calm, mild confusion, unable to fully assess orientation.          Palliative Performance Scale: 40%             Additional Data Reviewed: Recent Labs     08/10/15  0001  08/10/15  0015  08/10/15  0649  WBC  4.6   --   5.9  HGB  10.4*  11.6*  10.5*  PLT  54*   --   PLATELET CLUMPS NOTED ON SMEAR, COUNT APPEARS DECREASED  NA   --   129*  129*  BUN   --   42*  39*  CREATININE   --   1.60*  1.71*     Time In: 1350 Time Out: 1410 Time Total: 80 minutes Greater than 50%  of this time was spent counseling and coordinating care related to the above assessment and plan. GOC discussion shared with nursing staff, CM, and Dr. Joseph Art.     Signed by: Katheran Awe, NP  Katheran Awe, NP  08/10/2015, 3:01 PM  Please contact Palliative Medicine Team phone at [phone] for questions and concerns.

## 2015-08-10 NOTE — ED Notes (Signed)
Accurate weight attempted with no success pt unable to stand on standing scale, no accurate weight available for the stretcher to weight pt on the stretcher, Lasix given as ordered.

## 2015-08-10 NOTE — Care Management Note (Signed)
Case Management Note  Patient Details  Name: Bill Walker MRN: [id number] Date of Birth: Mar 06, 1928  Subjective/Objective:         Adm w chf,resp failure           Action/Plan:lives w wife, pcp dr Idamae [facility]   Expected Discharge Date:                  Expected Discharge Plan:     In-House Referral:     Discharge planning Services     Post Acute Care Choice:    Choice offered to:     DME Arranged:    DME Agency:     HH Arranged:    HH Agency:     Status of Service:     Medicare Important Message Given:    Date Medicare IM Given:    Medicare IM give by:    Date Additional Medicare IM Given:    Additional Medicare Important Message give by:     If discussed at Long Length of Stay Meetings, dates discussed:    Additional Comments: ur review done  Hanley Hays, RN 08/10/2015, 7:48 AM

## 2015-08-11 LAB — CBC WITH DIFFERENTIAL/PLATELET
Basophils Absolute: 0 10*3/uL (ref 0.0–0.1)
Basophils Relative: 0 %
EOS ABS: 0 10*3/uL (ref 0.0–0.7)
EOS PCT: 0 %
HCT: 29.8 % — ABNORMAL LOW (ref 39.0–52.0)
Hemoglobin: 9.7 g/dL — ABNORMAL LOW (ref 13.0–17.0)
LYMPHS ABS: 0.2 10*3/uL — AB (ref 0.7–4.0)
Lymphocytes Relative: 5 %
MCH: 33.9 pg (ref 26.0–34.0)
MCHC: 32.6 g/dL (ref 30.0–36.0)
MCV: 104.2 fL — ABNORMAL HIGH (ref 78.0–100.0)
MONO ABS: 0.2 10*3/uL (ref 0.1–1.0)
MONOS PCT: 5 %
Neutro Abs: 3.2 10*3/uL (ref 1.7–7.7)
Neutrophils Relative %: 90 %
PLATELETS: 68 10*3/uL — AB (ref 150–400)
RBC: 2.86 MIL/uL — ABNORMAL LOW (ref 4.22–5.81)
RDW: 13.6 % (ref 11.5–15.5)
WBC: 3.5 10*3/uL — AB (ref 4.0–10.5)

## 2015-08-11 LAB — COMPREHENSIVE METABOLIC PANEL
ALT: 10 U/L — AB (ref 17–63)
AST: 20 U/L (ref 15–41)
Albumin: 2.7 g/dL — ABNORMAL LOW (ref 3.5–5.0)
Alkaline Phosphatase: 36 U/L — ABNORMAL LOW (ref 38–126)
Anion gap: 10 (ref 5–15)
BUN: 48 mg/dL — AB (ref 6–20)
CHLORIDE: 79 mmol/L — AB (ref 101–111)
CO2: 43 mmol/L — ABNORMAL HIGH (ref 22–32)
CREATININE: 1.89 mg/dL — AB (ref 0.61–1.24)
Calcium: 8.9 mg/dL (ref 8.9–10.3)
GFR calc Af Amer: 36 mL/min — ABNORMAL LOW (ref >60)
GFR, EST NON AFRICAN AMERICAN: 31 mL/min — AB (ref >60)
Glucose, Bld: 130 mg/dL — ABNORMAL HIGH (ref 65–99)
Potassium: 4.5 mmol/L (ref 3.5–5.1)
Sodium: 132 mmol/L — ABNORMAL LOW (ref 135–145)
Total Bilirubin: 1.2 mg/dL (ref 0.3–1.2)
Total Protein: 4.8 g/dL — ABNORMAL LOW (ref 6.5–8.1)

## 2015-08-11 LAB — MAGNESIUM: MAGNESIUM: 1.8 mg/dL (ref 1.7–2.4)

## 2015-08-11 MED ORDER — ENSURE ENLIVE PO LIQD
237.0000 mL | Freq: Two times a day (BID) | ORAL | Status: DC
  Administered 2015-08-11 – 2015-08-24 (×15): 237 mL via ORAL

## 2015-08-11 MED ORDER — INFLUENZA VAC SPLIT QUAD 0.5 ML IM SUSY
0.5000 mL | PREFILLED_SYRINGE | INTRAMUSCULAR | Status: AC
  Administered 2015-08-13: 0.5 mL via INTRAMUSCULAR
  Filled 2015-08-11: qty 0.5

## 2015-08-11 MED ORDER — VANCOMYCIN HCL 10 G IV SOLR
1250.0000 mg | Freq: Once | INTRAVENOUS | Status: AC
  Administered 2015-08-12: 1250 mg via INTRAVENOUS
  Filled 2015-08-11: qty 1250

## 2015-08-11 MED ORDER — ALBUTEROL SULFATE (2.5 MG/3ML) 0.083% IN NEBU: 2.5000 mg | INHALATION_SOLUTION | RESPIRATORY_TRACT | Status: DC | PRN

## 2015-08-11 MED ORDER — SODIUM CHLORIDE 0.9 % IV SOLN
INTRAVENOUS | Status: DC
  Administered 2015-08-11: 10:00:00 via INTRAVENOUS

## 2015-08-11 MED ORDER — MAGNESIUM SULFATE 2 GM/50ML IV SOLN
2.0000 g | Freq: Once | INTRAVENOUS | Status: AC
  Administered 2015-08-11: 2 g via INTRAVENOUS
  Filled 2015-08-11: qty 50

## 2015-08-11 MED ORDER — SODIUM CHLORIDE 0.9 % IV SOLN
250.0000 mg | Freq: Four times a day (QID) | INTRAVENOUS | Status: AC
  Administered 2015-08-12 – 2015-08-19 (×32): 250 mg via INTRAVENOUS
  Filled 2015-08-11 (×34): qty 250

## 2015-08-11 MED ORDER — FUROSEMIDE 10 MG/ML IJ SOLN
40.0000 mg | Freq: Two times a day (BID) | INTRAMUSCULAR | Status: DC
  Administered 2015-08-12 – 2015-08-13 (×4): 40 mg via INTRAVENOUS
  Filled 2015-08-11 (×4): qty 4

## 2015-08-11 MED ORDER — IPRATROPIUM-ALBUTEROL 0.5-2.5 (3) MG/3ML IN SOLN: 3.0000 mL | Freq: Three times a day (TID) | RESPIRATORY_TRACT | Status: DC

## 2015-08-11 MED ORDER — VANCOMYCIN HCL 10 G IV SOLR
1250.0000 mg | INTRAVENOUS | Status: DC
  Administered 2015-08-12: 1250 mg via INTRAVENOUS
  Filled 2015-08-11 (×2): qty 1250

## 2015-08-11 MED ORDER — IPRATROPIUM-ALBUTEROL 0.5-2.5 (3) MG/3ML IN SOLN
3.0000 mL | Freq: Four times a day (QID) | RESPIRATORY_TRACT | Status: DC
  Administered 2015-08-11 – 2015-08-13 (×8): 3 mL via RESPIRATORY_TRACT
  Filled 2015-08-11 (×8): qty 3

## 2015-08-11 NOTE — Clinical Documentation Improvement (Signed)
Hospitalist   Query 1 of 2 Possible Clinical Conditions:  - hyponatremia  - other condition  - unable to clinically determine  Clinical Information: Na+ on admission - 129 Na+ 08/11/15 - 132  Query 2 of 2 Please document if a condition below provides greater specificity regarding the patient's "COPD flareup":  - COPD exacerbation  - Other condition  - Unable to clinically determine  Clinical Information: "COPD flareup" treated with SoluMedrol, duo nebs, levofloxacin" is documented in the H&P and progress note.  (please document your response in the progress notes and not on the query form itself.)  Please exercise your independent, professional judgment when responding. A specific answer is not anticipated or expected.   Thank You, Jerral Ralph  RN BSN CCDS [phone] Health Information Management [place]

## 2015-08-11 NOTE — Care Management Note (Signed)
Case Management Note  Patient Details  Name: Bill Walker MRN: [id number] Date of Birth: 1928-09-20  Subjective/Objective:     Adm on bipap               Action/Plan:lives w wife   Expected Discharge Date:                  Expected Discharge Plan:  Skilled Nursing Facility  In-House Referral:  Clinical Social Work, Hospice / Palliative Care  Discharge planning Services     Post Acute Care Choice:    Choice offered to:     DME Arranged:    DME Agency:     HH Arranged:    HH Agency:     Status of Service:     Medicare Important Message Given:    Date Medicare IM Given:    Medicare IM give by:    Date Additional Medicare IM Given:    Additional Medicare Important Message give by:     If discussed at Long Length of Stay Meetings, dates discussed:    Additional Comments: sw consult placed. Has been seen by paliative care.  Hanley Hays, RN 08/11/2015, 9:56 AM

## 2015-08-11 NOTE — Procedures (Signed)
Pt placed on bipap due to low saturation. Pt is stable at this time and RT will continue to monitor.

## 2015-08-11 NOTE — Progress Notes (Signed)
ANTIBIOTIC CONSULT NOTE - INITIAL  Pharmacy Consult for Vancocin and Primaxin Indication: bacteremia  Allergies  Allergen Reactions  . Doxazosin Mesylate Rash    rash  . Penicillins Other (See Comments)    REACTION: unknown reaction    Patient Measurements: Height:  (180.3 cm) Weight: 195 lb 15.8 oz (88.9 kg) IBW/kg (Calculated) : 75.3  Vital Signs: Temp: 98.2 F (36.8 C) (09/28 1919) Temp Source: Oral (09/28 1919) BP: 140/66 mmHg (09/28 1919) Pulse Rate: 59 (09/28 1800)  Labs:  Recent Labs  08/10/15 0001 08/10/15 0015 08/10/15 0649 08/11/15 0220  WBC 4.6  --  5.9 3.5*  HGB 10.4* 11.6* 10.5* 9.7*  PLT 54*  --  PLATELET CLUMPS NOTED ON SMEAR, COUNT APPEARS DECREASED 68*  CREATININE  --  1.60* 1.71* 1.89*   Estimated Creatinine Clearance: 30.4 mL/min (by C-G formula based on Cr of 1.89).   Microbiology: Recent Results (from the past 720 hour(s))  Blood culture (routine x 2)     Status: None (Preliminary result)   Collection Time: 08/10/15 12:01 AM  Result Value Ref Range Status   Specimen Description BLOOD LEFT ANTECUBITAL  Final   Special Requests BOTTLES DRAWN AEROBIC ONLY 5CC  Final   Culture  Setup Time   Final    GRAM POSITIVE RODS AEROBIC BOTTLE ONLY CRITICAL RESULT CALLED TO, READ BACK BY AND VERIFIED WITH: Judie Petit [id number] RN [id number] 08/11/15 A BROWNING    Culture NO GROWTH 1 DAY  Final   Report Status PENDING  Incomplete  Blood culture (routine x 2)     Status: None (Preliminary result)   Collection Time: 08/10/15  1:40 AM  Result Value Ref Range Status   Specimen Description BLOOD RIGHT ANTECUBITAL  Final   Special Requests BOTTLES DRAWN AEROBIC AND ANAEROBIC 10CC   Final   Culture NO GROWTH 1 DAY  Final   Report Status PENDING  Incomplete  MRSA PCR Screening     Status: None   Collection Time: 08/10/15  4:35 AM  Result Value Ref Range Status   MRSA by PCR NEGATIVE NEGATIVE Final    Comment:        The GeneXpert MRSA Assay (FDA approved for NASAL  specimens only), is one component of a comprehensive MRSA colonization surveillance program. It is not intended to diagnose MRSA infection nor to guide or monitor treatment for MRSA infections.     Medical History: Past Medical History  Diagnosis Date  . Ischemic cardiomyopathy     a. 02/2005 EF 32% (myoview report).  . Hypertension   . Coronary artery disease     a. s/p prior MI w/ RCA stenting;  b. 03/2011 Cath: LM 20d, LAD 40p/100p, LCX 40p, L->L collats to distal LAD, RCA dominant, patent prox stent, 3m ISR, 50-57m, 50-60d, 60d-->Med Rx;  c. 04/2015 NSTEMI/Cath: LM 45, LAD 80/4m, 100d (L->L collats), D2 99ost, OM1 60, RCA patent stent prox, 38m, 70d, RPAV1/2 70->Med Rx.  Marland Kitchen Hyperlipidemia   . Vitamin B12 deficiency   . History of pneumonia   . Thrombocytopenia   . Cor pulmonale   . Hearing loss   . Alcohol abuse, unspecified   . Tobacco use disorder   . CKD (chronic kidney disease), stage III   . Osteoarthritis   . Gout   . Diverticulosis of colon   . COPD (chronic obstructive pulmonary disease)   . Anemia   . Bronchiectasis     CT Chest 07/07/09 bilateral lower lobe medial bronchiectasis  . Chronic systolic  CHF (congestive heart failure)     a. 02/2005 EF 32% (myoview report).    Medications:  Prescriptions prior to admission  Medication Sig Dispense Refill Last Dose  . amLODipine (NORVASC) 5 MG tablet Take 5 mg by mouth daily.   08/09/2015 at Unknown time  . aspirin 81 MG tablet Take 1 tablet (81 mg total) by mouth daily.   08/09/2015 at Unknown time  . calcium carbonate (TUMS - DOSED IN MG ELEMENTAL CALCIUM) 500 MG chewable tablet Chew 1 tablet by mouth daily.   08/09/2015 at Unknown time  . cholecalciferol (VITAMIN D) 1000 UNITS tablet Take 1 tablet (1,000 Units total) by mouth daily.   08/09/2015 at Unknown time  . clopidogrel (PLAVIX) 75 MG tablet TAKE ONE TABLET BY MOUTH ONCE DAILY 90 tablet 3 08/09/2015 at Unknown time  . diphenhydrAMINE (SOMINEX) 25 MG tablet Take  50 mg by mouth at bedtime as needed for sleep.    08/09/2015 at Unknown time  . furosemide (LASIX) 80 MG tablet TAKE 1 TABLET BY MOUTH DAILY & TAKE 1/2 TAB BY MOUTH IN THE EVENINGS ON Monday, Wed & Friday ONLY.   08/08/2015 at Unknown time  . isosorbide mononitrate (IMDUR) 30 MG 24 hr tablet TAKE TWO TABLETS BY MOUTH ONCE DAILY 180 tablet 1 08/09/2015 at Unknown time  . metoprolol succinate (TOPROL-XL) 100 MG 24 hr tablet Take 1 tablet (100 mg total) by mouth daily. Take with or immediately following a meal. 90 tablet 3 08/09/2015 at 0800  . nitroGLYCERIN (NITROSTAT) 0.4 MG SL tablet Place 0.4 mg under the tongue every 5 (five) minutes as needed for chest pain (up to 3 doses).   unknown  . ranolazine (RANEXA) 500 MG 12 hr tablet Take 1 tablet (500 mg total) by mouth 2 (two) times daily. 60 tablet 6 08/09/2015 at Unknown time  . simvastatin (ZOCOR) 20 MG tablet TAKE ONE TABLET BY MOUTH AT BEDTIME 90 tablet 1 08/09/2015 at Unknown time  . tamsulosin (FLOMAX) 0.4 MG CAPS capsule Take 1 capsule (0.4 mg total) by mouth daily. 90 capsule 3 08/09/2015 at Unknown time  . tiotropium (SPIRIVA HANDIHALER) 18 MCG inhalation capsule INHALE ONE CAPSULE BY MOUTH ONCE DAILY 90 capsule 3 08/09/2015 at Unknown time  . vitamin B-12 (CYANOCOBALAMIN) 1000 MCG tablet Take 1,000 mcg by mouth daily.   08/09/2015 at Unknown time  . NON FORMULARY Oxygen- 3 lpm 24/7   Taking   Scheduled:  . aspirin  81 mg Oral Daily  . clopidogrel  75 mg Oral Daily  . dextromethorphan-guaiFENesin  1 tablet Oral BID  . feeding supplement (ENSURE ENLIVE)  237 mL Oral BID BM  . furosemide  40 mg Intravenous Q12H  . [START ON 08/12/2015] Influenza vac split quadrivalent PF  0.5 mL Intramuscular Tomorrow-1000  . ipratropium-albuterol  3 mL Nebulization QID  . isosorbide mononitrate  60 mg Oral Daily  . methylPREDNISolone (SOLU-MEDROL) injection  60 mg Intravenous Q24H  . metoprolol succinate  100 mg Oral Daily  . ranolazine  500 mg Oral BID  .  simvastatin  20 mg Oral QHS  . sodium chloride  3 mL Intravenous Q12H  . tamsulosin  0.4 mg Oral Daily    Assessment: 81yo male has been on Levaquin for CAP, now w/ GPR on blood cx, to broaden IV ABX.  Goal of Therapy:  Vancomycin trough level 15-20 mcg/ml  Plan:  Will begin vancomycin  IV Q24H and Primaxin  IV Q6H and monitor CBC, Cx, levels prn.  Barkley Bruns  Annice Needy, PharmD, BCPS  08/11/2015,11:33 PM

## 2015-08-11 NOTE — Progress Notes (Signed)
Pharmacist Heart Failure Core Measure Documentation  Assessment: Bill Walker has an EF documented as 35-40% on 05/18/2015 by ECHO.  Rationale: Heart failure patients with left ventricular systolic dysfunction (LVSD) and an EF < 40% should be prescribed an angiotensin converting enzyme inhibitor (ACEI) or angiotensin receptor blocker (ARB) at discharge unless a contraindication is documented in the medical record.  This patient is not currently on an ACEI or ARB for HF.  This note is being placed in the record in order to provide documentation that a contraindication to the use of these agents is present for this encounter.  ACE Inhibitor or Angiotensin Receptor Blocker is contraindicated (specify all that apply)    ACEI allergy AND ARB allergy   Angioedema   Moderate or severe aortic stenosis   Hyperkalemia   Hypotension   Renal artery stenosis   Worsening renal function, preexisting renal disease or dysfunction   Fredrik Rigger 08/11/2015 9:12 AM

## 2015-08-11 NOTE — Progress Notes (Signed)
[place] TEAM 1 - Stepdown/ICU TEAM PROGRESS NOTE  Bill Walker ZOX:[id number] DOB: 1928-10-31 DOA: 08/09/2015 PCP: Roxy Manns, MD  Admit HPI / Brief Narrative: 81 y.o.M w/ hx of chronic hypoxic respiratory failure due to COPD on 3L O2 home, CAD, HTN, Chronic Systolic CHF, dyslipidemia, and CKD who was brought in by his wife stating he had been progressively declining over a few months w/ loss of appetite and a 60 pound weight loss.  He did not complain of specific localizing symptoms.    HPI/Subjective: The patient is alert and conversant and in good spirits.  He states he feels much better today than he did yesterday.  His wife at bedside agrees he appears to have improved.  He denies chest pain nausea vomiting or abdominal pain but does admit to persisting shortness of breath.  Assessment/Plan:  Acute on chronic hypoxic and hypercarbic respiratory failure - multifactorial  -Per wife is on 3 L O2 via [place] at home 24 hours a day -Titrate oxygen downward as able  Bibasilar CAP -Levofloxacin 750 mg  Acute bronchospastic exacerbation of COPD  -DuoNeb QID -Solu-Medrol daily -Flutter valve  Acute on chronic systolic CHF w/ pulmonary edema  TTE July 2016 w/ EF 35-40% w/ focal WMAs and PA pressure 49mm Hg - baseline wgt ~90kg th/o this year - presently 88.9kg - I/O net negative ~800cc since admit - continue gentle diuresis as able  Chronic renal failure  crt 1.85 July 2016, w/ recent values as high as ~2.0 - follow trend as creatinine creeping upward  Hyponatremia  Due to CHF - slowly improving w/ diuresis - follow trend  HTN BP currently reasonably controlled   CAD Severe LAD and RCA disease by cath Juen 2016 - not an operable candidate - medical tx only option - continue aspirin and Plavix  BPH  Continue Flomax  Dyslipidemia Continue statin  Protein calorie malnutrition Heart healthy diet   Code Status: FULL Family Communication: Spoke with patient and his wife at  bedside Disposition Plan: SDU  Consultants: Palliative Care   Procedures: none  Antibiotics: Levaquin 9/26 > Vanc 9/26   DVT prophylaxis: SQ heparin   Objective: Blood pressure 129/65, pulse 102, temperature 97.7 F (36.5 C), temperature source Oral, resp. rate 30, height  (1.803 m), weight 88.9 kg (195 lb 15.8 oz), SpO2 97 %.  Intake/Output Summary (Last 24 hours) at 08/11/15 0950 Last data filed at 08/11/15 0900  Gross per 24 hour  Intake    180 ml  Output    800 ml  Net   -620 ml    Exam: General: Mild respiratory distress requiring 6 L nasal cannula Lungs: Fine crackles diffusely with mild expiratory wheezing Cardiovascular: Tachycardic at approximately 110 bpm without appreciable murmur or rub Abdomen: Nontender, nondistended, soft, bowel sounds positive, no rebound, no ascites, no appreciable mass Extremities: No significant cyanosis, or clubbing; 2+ edema bilateral lower extremities  Data Reviewed: Basic Metabolic Panel:  Recent Labs Lab 08/10/15 0015 08/10/15 0649 08/11/15 0220  NA 129* 129* 132*  K 4.6 4.6 4.5  CL 78* 77* 79*  CO2  --  43* 43*  GLUCOSE 115* 103* 130*  BUN 42* 39* 48*  CREATININE 1.60* 1.71* 1.89*  CALCIUM  --  8.6* 8.9  MG  --   --  1.8    CBC:  Recent Labs Lab 08/10/15 0001 08/10/15 0015 08/10/15 0649 08/11/15 0220  WBC 4.6  --  5.9 3.5*  NEUTROABS 3.6  --  4.6 3.2  HGB 10.4* 11.6* 10.5* 9.7*  HCT 32.1* 34.0* 32.7* 29.8*  MCV 107.4*  --  106.9* 104.2*  PLT 54*  --  PLATELET CLUMPS NOTED ON SMEAR, COUNT APPEARS DECREASED 68*    Liver Function Tests:  Recent Labs Lab 08/10/15 0649 08/11/15 0220  AST 20 20  ALT 9* 10*  ALKPHOS 39 36*  BILITOT 2.1* 1.2  PROT 5.0* 4.8*  ALBUMIN 3.1* 2.7*    Recent Results (from the past 240 hour(s))  MRSA PCR Screening     Status: None   Collection Time: 08/10/15  4:35 AM  Result Value Ref Range Status   MRSA by PCR NEGATIVE NEGATIVE Final    Comment:        The  GeneXpert MRSA Assay (FDA approved for NASAL specimens only), is one component of a comprehensive MRSA colonization surveillance program. It is not intended to diagnose MRSA infection nor to guide or monitor treatment for MRSA infections.      Studies:   Recent x-ray studies have been reviewed in detail by the Attending Physician  Scheduled Meds:  Scheduled Meds: . aspirin  81 mg Oral Daily  . clopidogrel  75 mg Oral Daily  . dextromethorphan-guaiFENesin  1 tablet Oral BID  . heparin  5,000 Units Subcutaneous 3 times per day  . ipratropium-albuterol  3 mL Nebulization Q6H  . isosorbide mononitrate  60 mg Oral Daily  . levofloxacin (LEVAQUIN) IV  750 mg Intravenous Q48H  . magnesium sulfate 1 - 4 g bolus IVPB  2 g Intravenous Once  . methylPREDNISolone (SOLU-MEDROL) injection  60 mg Intravenous Q24H  . metoprolol succinate  100 mg Oral Daily  . ranolazine  500 mg Oral BID  . simvastatin  20 mg Oral QHS  . sodium chloride  3 mL Intravenous Q12H  . tamsulosin  0.4 mg Oral Daily    Time spent on care of this patient: 35 mins   MCCLUNG,JEFFREY T , MD   [facility] Office  [phone] Pager - Text Page per Loretha Stapler as per below:  On-Call/Text Page:      [email or web address]      password [email or web address]  If 7PM-7AM, please contact night-coverage [email or web address] Password [email or web address] 08/11/2015, 9:50 AM   LOS: 1 day

## 2015-08-12 LAB — COMPREHENSIVE METABOLIC PANEL
ALBUMIN: 3.5 g/dL (ref 3.5–5.0)
ALK PHOS: 48 U/L (ref 38–126)
ALT: 13 U/L — AB (ref 17–63)
AST: 25 U/L (ref 15–41)
Anion gap: 10 (ref 5–15)
BUN: 51 mg/dL — ABNORMAL HIGH (ref 6–20)
CO2: 45 mmol/L — AB (ref 22–32)
CREATININE: 1.99 mg/dL — AB (ref 0.61–1.24)
Calcium: 9.2 mg/dL (ref 8.9–10.3)
Chloride: 77 mmol/L — ABNORMAL LOW (ref 101–111)
GFR calc Af Amer: 34 mL/min — ABNORMAL LOW (ref >60)
GFR calc non Af Amer: 29 mL/min — ABNORMAL LOW (ref >60)
GLUCOSE: 139 mg/dL — AB (ref 65–99)
Potassium: 5.1 mmol/L (ref 3.5–5.1)
SODIUM: 132 mmol/L — AB (ref 135–145)
Total Bilirubin: 1.3 mg/dL — ABNORMAL HIGH (ref 0.3–1.2)
Total Protein: 5.8 g/dL — ABNORMAL LOW (ref 6.5–8.1)

## 2015-08-12 LAB — CBC WITH DIFFERENTIAL/PLATELET
BASOS PCT: 0 %
Basophils Absolute: 0 10*3/uL (ref 0.0–0.1)
EOS ABS: 0 10*3/uL (ref 0.0–0.7)
Eosinophils Relative: 0 %
HEMATOCRIT: 37.3 % — AB (ref 39.0–52.0)
Hemoglobin: 11.7 g/dL — ABNORMAL LOW (ref 13.0–17.0)
Lymphocytes Relative: 2 %
Lymphs Abs: 0.2 10*3/uL — ABNORMAL LOW (ref 0.7–4.0)
MCH: 33.5 pg (ref 26.0–34.0)
MCHC: 31.4 g/dL (ref 30.0–36.0)
MCV: 106.9 fL — ABNORMAL HIGH (ref 78.0–100.0)
MONO ABS: 0.2 10*3/uL (ref 0.1–1.0)
MONOS PCT: 2 %
Neutro Abs: 8.2 10*3/uL — ABNORMAL HIGH (ref 1.7–7.7)
Neutrophils Relative %: 96 %
Platelets: 101 10*3/uL — ABNORMAL LOW (ref 150–400)
RBC: 3.49 MIL/uL — ABNORMAL LOW (ref 4.22–5.81)
RDW: 13.8 % (ref 11.5–15.5)
WBC: 8.6 10*3/uL (ref 4.0–10.5)

## 2015-08-12 LAB — POCT I-STAT 3, ART BLOOD GAS (G3+)
ACID-BASE EXCESS: 23 mmol/L — AB (ref 0.0–2.0)
BICARBONATE: 49.5 meq/L — AB (ref 20.0–24.0)
O2 Saturation: 96 %
pCO2 arterial: 63.7 mmHg (ref 35.0–45.0)
pH, Arterial: 7.499 — ABNORMAL HIGH (ref 7.350–7.450)
pO2, Arterial: 78 mmHg — ABNORMAL LOW (ref 80.0–100.0)

## 2015-08-12 MED ORDER — DILTIAZEM HCL 25 MG/5ML IV SOLN
5.0000 mg | Freq: Once | INTRAVENOUS | Status: AC
Start: 2015-08-12 — End: 2015-08-12
  Administered 2015-08-12: 5 mg via INTRAVENOUS
  Filled 2015-08-12: qty 5

## 2015-08-12 MED ORDER — METOPROLOL TARTRATE 1 MG/ML IV SOLN
10.0000 mg | Freq: Four times a day (QID) | INTRAVENOUS | Status: DC
  Administered 2015-08-12 – 2015-08-13 (×6): 10 mg via INTRAVENOUS
  Filled 2015-08-12 (×6): qty 10

## 2015-08-12 MED ORDER — HYDRALAZINE HCL 20 MG/ML IJ SOLN
5.0000 mg | Freq: Two times a day (BID) | INTRAMUSCULAR | Status: DC
  Administered 2015-08-12 – 2015-08-14 (×4): 5 mg via INTRAVENOUS
  Filled 2015-08-12 (×7): qty 1

## 2015-08-12 MED ORDER — DILTIAZEM HCL 100 MG IV SOLR
5.0000 mg/h | INTRAVENOUS | Status: DC
  Administered 2015-08-12: 5 mg/h via INTRAVENOUS
  Administered 2015-08-13: 10 mg/h via INTRAVENOUS
  Filled 2015-08-12 (×2): qty 100

## 2015-08-12 NOTE — Progress Notes (Signed)
PT Cancellation Note  Patient Details Name: Vikram Tillett MRN: [id number] DOB: 1928-04-03   Cancelled Treatment:    Reason Eval/Treat Not Completed: Medical issues which prohibited therapy (pt remains on bipap, confused and RN stated pt not yet appropriate. Will attempt next date)   Delorse Lek 08/12/2015, 8:38 AM Delaney Meigs, PT [phone]

## 2015-08-12 NOTE — Progress Notes (Signed)
OT Cancellation Note  Patient Details Name: Brad Lieurance MRN: [id number] DOB: [birth year]   Cancelled Treatment:    Reason Eval/Treat Not Completed: Medical issues which prohibited therapy  Angelene Giovanni [place], OTR/L [phone]  08/12/2015, 12:24 PM

## 2015-08-12 NOTE — Progress Notes (Signed)
2215- Patient in new afib with RVR rate 130s-150s sustaining, asymptomatic. Notified Craige Cotta, NP received order for cardizem push and EKG.   2245- Pt HR still afib with rate 130s. Received order to start on cardizem gtt. Will continue to monitor.  Bill Walker, Bill Walker 11:24 PM

## 2015-08-12 NOTE — Progress Notes (Signed)
New Afib with RVR tonight confirmed by 12 lead EKG. Cardizem  IV push given without success. Cardizem drip started. Pt on Plavix and ASA. Couldn't find any hx on chart of Afib. Pt is asymptomatic. Will follow.  Jimmye Norman, NP [facility]

## 2015-08-12 NOTE — Progress Notes (Signed)
Helping ICU RT- RT requested that pt be placed back on bipap d/t recent ABG results.  Pt tol well so far, no distress noted.  Pt appears comfortable, VSS, sat 98-100%

## 2015-08-12 NOTE — Progress Notes (Signed)
Daily Progress Note   Patient Name: Bill Walker       Date: 08/12/2015 DOB: Nov 07, 1928  Age: 82 y.o. MRN#: [id number] Attending Physician: Drema Dallas, MD Primary Care Physician: Roxy Manns, MD Admit Date: 08/09/2015  Reason for Consultation/Follow-up: Establishing goals of care and Psychosocial/spiritual support  Subjective: [name] is a 81 y.o. male with Past medical history of COPD chronic respiratory failure on chronic oxygen, coronary artery disease, essential hypertension, chronic systolic CHF, dyslipidemia, chronic kidney disease. The patient was brought in by wife as he has been progressively declining over last few months.The patient is coming from home where he lives with his wife and son. At his baseline ambulates with walker and is dependent for most of his ADL; does not manages his medication on his own. He is presently being supported with BiPAP.   Interval Events: Lillia Carmel, NP from palliative team met with [name] and his wife on 9/27 to discuss goals of care. At that time, his wife said that she would need talk with her family and wanted to arrange for a family meeting today. I stopped by to check on a time for this and [name] wife informed me that her sons were not able to be here today. She did say they would likely be able to be present tomorrow she does not know a specific time. I gave her my card and asked her to call as soon as she knew when they would be available so we can arrange for a family meeting.  [name] is lying in bed wearing BiPAP time examination. He is difficult to understand, and I believe that there is some level of confusion that continues to persist.  I spoke with his wife about his clinical course at home including decline in functional status, cognition, nutrition. This is been very chronic in nature and his wife is concerned that she will not be able to care for him at home.  We discussed that in light of multiple chronic  medical problems that have worsened with this acute problem, care should be focused on interventions that are likely to allow the patient to achieve his goals of getting out of the hospital and spending time with family. I discussed with her regarding heroic interventions at the end-of-life. We talked about the fact that performing CPR or placing her husband on a ventilator was not likely to lead to him getting well enough to ever leave the hospital.  She reports understanding this would like to continue the conversation tomorrow with her sons.  - The patient's son weren't available for family meeting today. His wife reports they will likely be available tomorrow. - She declined to further discuss any goals of care until her sons can be present.  Length of Stay: 2 days  Current Medications: Scheduled Meds:  . aspirin  81 mg Oral Daily  . clopidogrel  75 mg Oral Daily  . dextromethorphan-guaiFENesin  1 tablet Oral BID  . feeding supplement (ENSURE ENLIVE)  237 mL Oral BID BM  . furosemide  40 mg Intravenous Q12H  . hydrALAZINE  5 mg Intravenous BID  . imipenem-cilastatin  250 mg Intravenous 4 times per day  . Influenza vac split quadrivalent PF  0.5 mL Intramuscular Tomorrow-1000  . ipratropium-albuterol  3 mL Nebulization QID  . methylPREDNISolone (SOLU-MEDROL) injection  60 mg Intravenous Q24H  . metoprolol  10 mg Intravenous 4 times per day  . ranolazine  500 mg Oral BID  .  simvastatin  20 mg Oral QHS  . sodium chloride  3 mL Intravenous Q12H  . tamsulosin  0.4 mg Oral Daily  . vancomycin  1,250 mg Intravenous Q24H    Continuous Infusions:    PRN Meds: acetaminophen **OR** acetaminophen, albuterol, ondansetron **OR** ondansetron (ZOFRAN) IV  Palliative Performance Scale: 20%     Vital Signs: BP 139/102 mmHg  Pulse 115  Temp(Src) 99.1 F (37.3 C) (Axillary)  Resp 22  Ht $R'5\' 11"'se$  (1.803 m)  Wt 88.361 kg (194 lb 12.8 oz)  BMI 27.18 kg/m2  SpO2 98% SpO2: SpO2: 98 % O2  Device: O2 Device: Bi-PAP O2 Flow Rate: O2 Flow Rate (L/min): 4 L/min  Intake/output summary:  Intake/Output Summary (Last 24 hours) at 08/12/15 1652 Last data filed at 08/12/15 0800  Gross per 24 hour  Intake    350 ml  Output    485 ml  Net   -135 ml   LBM:   Baseline Weight: Weight: 83.462 kg (184 lb) Most recent weight: Weight: 88.361 kg (194 lb 12.8 oz)  Physical Exam: Constitutional: elderly frail, lying in bed Resp: Even and non labored, with use of BiPAP Cardio: A fib, rate 110-150's.  GI: Abd soft non tender Psych: calm, mild confusion, unable to fully assess orientation. Additional Data Reviewed: Recent Labs     08/11/15  0220  08/12/15  0240  WBC  3.5*  8.6  HGB  9.7*  11.7*  PLT  68*  101*  NA  132*  132*  BUN  48*  51*  CREATININE  1.89*  1.99*     Problem List:  Patient Active Problem List   Diagnosis Date Noted  . CHF (congestive heart failure) 08/10/2015  . Acute on chronic respiratory failure 08/10/2015  . Acute on chronic systolic CHF (congestive heart failure) 08/10/2015  . Dyslipidemia 08/10/2015  . Anorexia 08/10/2015  . Loss of weight 08/10/2015  . CAP (community acquired pneumonia) 08/10/2015  . Palliative care encounter   . DNR (do not resuscitate) discussion   . Acute on chronic respiratory failure with hypoxia   . Pulmonary hypertension   . Acute pulmonary edema   . CAD in native artery   . Protein-calorie malnutrition   . Dyspnea 07/20/2015  . Traumatic hematoma of right wrist 06/21/2015  . Multiple skin tears 06/21/2015  . Acute on chronic renal insufficiency 05/09/2015  . Contrast dye induced nephropathy 05/09/2015  . NSTEMI (non-ST elevated myocardial infarction) 05/04/2015  . COLD (chronic obstructive lung disease)   . Unstable angina 05/03/2015  . Ischemic cardiomyopathy   . Chronic systolic CHF (congestive heart failure)   . Coronary artery disease   . Hyperlipidemia   . Thrombocytopenia   . CKD (chronic kidney  disease), stage III   . COPD (chronic obstructive pulmonary disease)   . Cutaneous abscess of chest wall 01/26/2015  . Hematuria 05/12/2014  . Urinary frequency 03/25/2014  . Family history of prostate cancer 03/25/2014  . BPH (benign prostatic hyperplasia) 03/25/2014  . Chronic respiratory failure with hypercapnia 05/14/2013  . PACER/LEAD MECH. COMPLICATION 01/77/9390  . B12 deficiency 07/29/2009  . Elevated lipids 07/29/2009  . Non-STEMI (non-ST elevated myocardial infarction) 07/29/2009  . CARDIOMYOPATHY 07/29/2009  . THROMBOCYTOPENIA 02/19/2009  . GOUT 10/21/2007  . ANEMIA-NOS 10/21/2007  . History of alcohol abuse 10/21/2007  . HEARING LOSS 10/21/2007  . Essential hypertension 10/21/2007  . Coronary atherosclerosis 10/21/2007  . COR PULMONALE 10/21/2007  . 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  DIVERTICULOSIS, COLON 10/21/2007  . Renal insufficiency 10/21/2007  . OSTEOARTHRITIS 10/21/2007     Palliative Care Assessment & Plan    Code Status:  Full code  Goals of Care:  Remains unclear. The patient's wife is not willing to discuss until her sons can be here for a family meeting. She reports this will likely be tomorrow. She is unable to give me a time for the meeting or her sons phone numbers to try and arrange this myself. I left my card and asked her to call as soon as she knows what time they will be available.  Symptom Management:  Patient remains confused. He does deny any symptoms at this time  Psycho-social/Spiritual:  Desire for further Chaplaincy support:no   Prognosis: Unable to determine due to acute illness Discharge Planning: To be determined   Care plan was discussed with patient's wife and bedside nurse  Thank you for allowing the Palliative Medicine Team to assist in the care of this patient.   Time In: 1410 Time Out: 1440 Total Time 30      Greater than 50%  of this time was spent counseling and coordinating care  related to the above assessment and plan.   Micheline Rough, MD  08/12/2015, 4:52 PM  Please contact Palliative Medicine Team phone at [phone] for questions and concerns.

## 2015-08-12 NOTE — Progress Notes (Signed)
[place] TEAM 1 - Stepdown/ICU TEAM Progress Note  Bill Walker ZOX:[id number] DOB: [birth year] DOA: 08/09/2015 PCP: Roxy Manns, MD  Admit HPI / Brief Narrative: Bill Walker is a 81 y.o.WM PMHx Cronic Rspiratory Filure on 3 L O2 at home,COPD, , CAD native artery, Essential HTN, Cronic Sstolic CHF, dyslipidemia, CKD.  The patient was brought in by wife as he has been progressively declining over last few months. The patient has loss of appetite along with loss of weight. Wife mentions patient has lost 60 pounds of weight. Patient denies any nausea vomiting or constipation or abdominal pain. No also diarrhea. Patient has been having chronic cough. No fever or chills. Patient has been more tired and lethargic. No chest pain or no fall recently. But since his recent admission in June patient has 3 falls are clear. Patient is hard of hearing therefore unable to provide any significant history. The history was taken with the help of patient's wife who is a primary caregiver for the patient. No recent change in his medications reported. No recent travel and no sick contacts.  The patient is coming from home  At his baseline ambulates with walker And is dependent for most of his ADL; does not manages his medication on his own.  HPI/Subjective: 9/29 A/O  0, patient opens his eyes and babbles incoherently (wife cannot understand patient.),  Assessment/Plan: Acute on chronic respiratory failure/CAP -Per wife is on 3 L O2 via [place] at home 24 hours a day -Currently on 2 L O2 via [place] maintain SPO2 98%  -Antibiotics changed to vancomycin in Primaxin  COPD (chronic obstructive pulmonary disease) -DuoNeb QID -Solu-Medrol 60 mg daily -Flutter valve  Primary metabolic alkalosis with concomitant respiratory acidosis (ABG with patient off BiPAP) -This is suggestive of possible COPD flareup along with ongoing pneumonia based on the chest x-ray and bacteremia. -Obtain PCXR in a.m. -See  COPD -Blood cultures positive Diphtheroids (Corynebacterium).  Acute on chronic systolic CHF (congestive heart failure) -Strict I&O since admission;-1.4 L -Lasix 40 mg BID -9/29 DC Imdur 60 mg daily secondary to deteriorating cognition, start hydralazine IV 5 mg BID -Metoprolol IV 10 mg QID secondary to deteriorating cognition PO DC'd  Pulmonary hypertension -See CHF  Pulmonary edema -See CHF  Essential hypertension -See CHF  CAD native artery -With the symptoms ongoing for more than 2 months with gradual decline, his current presentation Does not appear to be any cardiac event at present. Continue aspirin and Plavix.  BPH (benign prostatic hyperplasia) Continue Flomax.   Dyslipidemia Continue statin.  Protein calorie malnutrition -Heart healthy diet when off BiPAP  Plan of care -After long talk with Mrs. Greenstreet concerning the side effects of intubation/CPR she requested that we make patient DO NOT RESUSCITATE, but continue the current level of care.. -Wife stated had meeting today with palliative care but did not make any decision, however did agree to DNR with me.    Code Status: FULL Family Communication: family present at time of exam Disposition Plan: ??    Consultants: Sigurd Sos (palliative care)   Procedure/Significant Events: 7/5 echocardiogram;- Left ventricle: mild focalbasal hypertrophy of the septum.-LVEF=35%- 40%. akinesis of the apicalanteroseptal, anterolateral, and apical myocardium. - Left atrium: moderately dilated.- Pulmonary arteries: PA peak pressure: 49 mm Hg (S).   Culture 9/27 blood left AC/positive Diphtheroids (Corynebacterium) 9/27 blood right AC NGTD   Antibiotics: Levaquin 9/27 >>stopped 9/28 Vanc 9/27 >> Primaxin 9/29>>   DVT prophylaxis: subcutaneous Heparin    Devices    LINES /  TUBES:      Continuous Infusions:   Objective: VITAL SIGNS: Temp: 97.7 F (36.5 C) (09/29 0745) Temp Source: Axillary  (09/29 0745) BP: 139/102 mmHg (09/29 0745) Pulse Rate: 116 (09/29 0745) SPO2; FIO2:   Intake/Output Summary (Last 24 hours) at 08/12/15 1151 Last data filed at 08/12/15 0800  Gross per 24 hour  Intake    600 ml  Output    810 ml  Net   -210 ml     Exam: General: A/O  0, patient opens his eyes and babbles incoherently (wife cannot understand patient.),  Acute on chronic respiratory failure, currently on   Eyes: Negative headache, eye pain, double vision,negative scleral hemorrhage ENT: Negative Runny nose, negative ear pain, negative gingival bleeding, Neck:  Negative scars, masses, torticollis, lymphadenopathy, JVD Lungs: diffuse decreased breath sounds, negative wheezes or crackles Cardiovascular: Irregular irregular rhythm and rate without murmur gallop or rub normal S1 and S2 Abdomen:negative abdominal pain, nondistended, positive soft, bowel sounds, no rebound, no ascites, no appreciable mass Extremities: No significant cyanosis, clubbing, positive bilateral pedal edema to hips 1-2+ Psychiatric:  Negative depression, negative anxiety, negative fatigue, negative mania  Neurologic:  Cranial nerves II through XII intact, tongue/uvula midline, all extremities muscle strength 5/5, sensation intact throughout, negative dysarthria, negative expressive aphasia, negative receptive aphasia.   Data Reviewed: Basic Metabolic Panel:  Recent Labs Lab 08/10/15 0015 08/10/15 0649 08/11/15 0220 08/12/15 0240  NA 129* 129* 132* 132*  K 4.6 4.6 4.5 5.1  CL 78* 77* 79* 77*  CO2  --  43* 43* 45*  GLUCOSE 115* 103* 130* 139*  BUN 42* 39* 48* 51*  CREATININE 1.60* 1.71* 1.89* 1.99*  CALCIUM  --  8.6* 8.9 9.2  MG  --   --  1.8  --    Liver Function Tests:  Recent Labs Lab 08/10/15 0649 08/11/15 0220 08/12/15 0240  AST ALT 9* 10* 13*  ALKPHOS 39 36* 48  BILITOT 2.1* 1.2 1.3*  PROT 5.0* 4.8* 5.8*  ALBUMIN 3.1* 2.7* 3.5   No results for input(s): LIPASE, AMYLASE  in the last 168 hours. No results for input(s): AMMONIA in the last 168 hours. CBC:  Recent Labs Lab 08/10/15 0001 08/10/15 0015 08/10/15 0649 08/11/15 0220 08/12/15 0240  WBC 4.6  --  5.9 3.5* 8.6  NEUTROABS 3.6  --  4.6 3.2 8.2*  HGB 10.4* 11.6* 10.5* 9.7* 11.7*  HCT 32.1* 34.0* 32.7* 29.8* 37.3*  MCV 107.4*  --  106.9* 104.2* 106.9*  PLT 54*  --  PLATELET CLUMPS NOTED ON SMEAR, COUNT APPEARS DECREASED 68* 101*   Cardiac Enzymes: No results for input(s): CKTOTAL, CKMB, CKMBINDEX, TROPONINI in the last 168 hours. BNP (last 3 results)  Recent Labs  05/03/15 1450 08/10/15 0001  BNP 379.7* 891.3*    ProBNP (last 3 results)  Recent Labs  07/20/15 1724  PROBNP 856.0*    CBG: No results for input(s): GLUCAP in the last 168 hours.  Recent Results (from the past 240 hour(s))  Blood culture (routine x 2)     Status: None (Preliminary result)   Collection Time: 08/10/15 12:01 AM  Result Value Ref Range Status   Specimen Description BLOOD LEFT ANTECUBITAL  Final   Special Requests BOTTLES DRAWN AEROBIC ONLY 5CC  Final   Culture  Setup Time   Final    GRAM POSITIVE RODS AEROBIC BOTTLE ONLY CRITICAL RESULT CALLED TO, READ BACK BY AND VERIFIED WITH: Judie Petit ZOXWRUE RN [id number] 08/11/15 A  BROWNING    Culture NO GROWTH 1 DAY  Final   Report Status PENDING  Incomplete  Blood culture (routine x 2)     Status: None (Preliminary result)   Collection Time: 08/10/15  1:40 AM  Result Value Ref Range Status   Specimen Description BLOOD RIGHT ANTECUBITAL  Final   Special Requests BOTTLES DRAWN AEROBIC AND ANAEROBIC 10CC   Final   Culture NO GROWTH 1 DAY  Final   Report Status PENDING  Incomplete  MRSA PCR Screening     Status: None   Collection Time: 08/10/15  4:35 AM  Result Value Ref Range Status   MRSA by PCR NEGATIVE NEGATIVE Final    Comment:        The GeneXpert MRSA Assay (FDA approved for NASAL specimens only), is one component of a comprehensive MRSA  colonization surveillance program. It is not intended to diagnose MRSA infection nor to guide or monitor treatment for MRSA infections.      Studies:  Recent x-ray studies have been reviewed in detail by the Attending Physician  Scheduled Meds:  Scheduled Meds: . aspirin  81 mg Oral Daily  . clopidogrel  75 mg Oral Daily  . dextromethorphan-guaiFENesin  1 tablet Oral BID  . feeding supplement (ENSURE ENLIVE)  237 mL Oral BID BM  . furosemide  40 mg Intravenous Q12H  . imipenem-cilastatin  250 mg Intravenous 4 times per day  . Influenza vac split quadrivalent PF  0.5 mL Intramuscular Tomorrow-1000  . ipratropium-albuterol  3 mL Nebulization QID  . isosorbide mononitrate  60 mg Oral Daily  . methylPREDNISolone (SOLU-MEDROL) injection  60 mg Intravenous Q24H  . metoprolol succinate  100 mg Oral Daily  . ranolazine  500 mg Oral BID  . simvastatin  20 mg Oral QHS  . sodium chloride  3 mL Intravenous Q12H  . tamsulosin  0.4 mg Oral Daily  . vancomycin  1,250 mg Intravenous Q24H    Time spent on care of this patient: 40 mins   WOODS, Roselind Messier , MD  [facility] Office  [phone] Pager [phone]  On-Call/Text Page:      [email or web address]      password [email or web address]  If 7PM-7AM, please contact night-coverage [email or web address] Password [facility] 08/12/2015, 11:51 AM   LOS: 2 days   Care during the described time interval was provided by me .  I have reviewed this patient's available data, including medical history, events of note, physical examination, and all test results as part of my evaluation. I have personally reviewed and interpreted all radiology studies.   Carolyne Littles, MD [phone] Pager

## 2015-08-13 ENCOUNTER — Inpatient Hospital Stay (HOSPITAL_COMMUNITY): Payer: Medicare Other

## 2015-08-13 DIAGNOSIS — J9621 Acute and chronic respiratory failure with hypoxia: Secondary | ICD-10-CM | POA: Diagnosis not present

## 2015-08-13 LAB — COMPREHENSIVE METABOLIC PANEL
ALT: 12 U/L — ABNORMAL LOW (ref 17–63)
AST: 21 U/L (ref 15–41)
Albumin: 3 g/dL — ABNORMAL LOW (ref 3.5–5.0)
Alkaline Phosphatase: 36 U/L — ABNORMAL LOW (ref 38–126)
Anion gap: 11 (ref 5–15)
BILIRUBIN TOTAL: 1.1 mg/dL (ref 0.3–1.2)
BUN: 53 mg/dL — AB (ref 6–20)
CHLORIDE: 83 mmol/L — AB (ref 101–111)
CO2: 44 mmol/L — ABNORMAL HIGH (ref 22–32)
CREATININE: 2.25 mg/dL — AB (ref 0.61–1.24)
Calcium: 9 mg/dL (ref 8.9–10.3)
GFR, EST AFRICAN AMERICAN: 29 mL/min — AB (ref >60)
GFR, EST NON AFRICAN AMERICAN: 25 mL/min — AB (ref >60)
Glucose, Bld: 117 mg/dL — ABNORMAL HIGH (ref 65–99)
POTASSIUM: 5 mmol/L (ref 3.5–5.1)
Sodium: 138 mmol/L (ref 135–145)
TOTAL PROTEIN: 4.8 g/dL — AB (ref 6.5–8.1)

## 2015-08-13 LAB — CULTURE, BLOOD (ROUTINE X 2)

## 2015-08-13 LAB — CBC WITH DIFFERENTIAL/PLATELET
BASOS ABS: 0 10*3/uL (ref 0.0–0.1)
Basophils Relative: 0 %
EOS ABS: 0 10*3/uL (ref 0.0–0.7)
EOS PCT: 0 %
HCT: 33.4 % — ABNORMAL LOW (ref 39.0–52.0)
HEMOGLOBIN: 10.7 g/dL — AB (ref 13.0–17.0)
LYMPHS ABS: 0.2 10*3/uL — AB (ref 0.7–4.0)
Lymphocytes Relative: 3 %
MCH: 34.4 pg — ABNORMAL HIGH (ref 26.0–34.0)
MCHC: 32 g/dL (ref 30.0–36.0)
MCV: 107.4 fL — ABNORMAL HIGH (ref 78.0–100.0)
Monocytes Absolute: 0.2 10*3/uL (ref 0.1–1.0)
Monocytes Relative: 3 %
NEUTROS PCT: 94 %
Neutro Abs: 6.7 10*3/uL (ref 1.7–7.7)
PLATELETS: 79 10*3/uL — AB (ref 150–400)
RBC: 3.11 MIL/uL — AB (ref 4.22–5.81)
RDW: 14.4 % (ref 11.5–15.5)
WBC: 7.1 10*3/uL (ref 4.0–10.5)

## 2015-08-13 MED ORDER — IPRATROPIUM BROMIDE 0.02 % IN SOLN
0.5000 mg | Freq: Four times a day (QID) | RESPIRATORY_TRACT | Status: DC
  Administered 2015-08-13 – 2015-08-15 (×9): 0.5 mg via RESPIRATORY_TRACT
  Filled 2015-08-13 (×9): qty 2.5

## 2015-08-13 MED ORDER — FUROSEMIDE 10 MG/ML IJ SOLN
20.0000 mg | Freq: Two times a day (BID) | INTRAMUSCULAR | Status: DC
  Administered 2015-08-14 – 2015-08-20 (×13): 20 mg via INTRAVENOUS
  Filled 2015-08-13 (×13): qty 2

## 2015-08-13 MED ORDER — LEVALBUTEROL HCL 0.63 MG/3ML IN NEBU
0.6300 mg | INHALATION_SOLUTION | Freq: Four times a day (QID) | RESPIRATORY_TRACT | Status: DC
  Administered 2015-08-13 – 2015-08-15 (×9): 0.63 mg via RESPIRATORY_TRACT
  Filled 2015-08-13 (×9): qty 3

## 2015-08-13 MED ORDER — METOPROLOL TARTRATE 1 MG/ML IV SOLN
15.0000 mg | Freq: Four times a day (QID) | INTRAVENOUS | Status: DC
  Administered 2015-08-14 (×3): 15 mg via INTRAVENOUS
  Filled 2015-08-13 (×3): qty 15

## 2015-08-13 MED ORDER — LEVALBUTEROL HCL 0.63 MG/3ML IN NEBU: 0.6300 mg | INHALATION_SOLUTION | RESPIRATORY_TRACT | Status: DC | PRN

## 2015-08-13 NOTE — Evaluation (Signed)
Physical Therapy Evaluation Patient Details Name: Bill Walker MRN: [id number] DOB: 1928-06-16 Today's Date: 08/13/2015   History of Present Illness  Bill Walker is a 81 y.o. male with Past medical history of COPD chronic respiratory failure on chronic oxygen, coronary artery disease, essential hypertension, chronic systolic CHF, dyslipidemia, chronic kidney disease. Admitted with SOB, progressive weakness, and loss of weight . Dx with acute on chronic CHF and respiratory failure  Clinical Impression  Pt pleasant but HOH. Pt desaturating on 6L [place] at rest but able to maintain sats 93% on 8L 40% venturi mask throughout gait and transfers with cues for breathing technique and posture. Pt with decreased activity tolerance, gait and balance who will benefit from acute therapy to maximize mobility, gait and function to decrease burden of care and return pt to PLOF.     Follow Up Recommendations SNF;Supervision/Assistance - 24 hour    Equipment Recommendations  None recommended by PT    Recommendations for Other Services       Precautions / Restrictions Precautions Precautions: Fall Precaution Comments: monitor sats, placed on ventrui mask (8 liters) for ambulation (prior to this he was on 6 liters [place]). When back in recliner post ambulation but him back on 6liters [place] (but sats were in mid to upper 80's only so placed pt back on 8 liters Venturi) Restrictions Weight Bearing Restrictions: No      Mobility  Bed Mobility Overal bed mobility: Needs Assistance Bed Mobility: Supine to Sit     Supine to sit: Min assist     General bed mobility comments: EOB on arrival  Transfers Overall transfer level: Needs assistance Equipment used: Rolling walker (2 wheeled) Transfers: Sit to/from Stand Sit to Stand: Min assist         General transfer comment: cues for hand placement and safety  Ambulation/Gait Ambulation/Gait assistance: Min assist;+2 safety/equipment Ambulation Distance  (Feet): 30 Feet Assistive device: Rolling walker (2 wheeled) Gait Pattern/deviations: Step-through pattern;Decreased stride length;Trunk flexed   Gait velocity interpretation: Below normal speed for age/gender General Gait Details: cues for posture, position in RW and safety with chair to follow due to fatigue, pt with tendency to push RW too anterior to  self  Stairs            Wheelchair Mobility    Modified Rankin (Stroke Patients Only)       Balance Overall balance assessment: Needs assistance Sitting-balance support: Feet supported Sitting balance-Leahy Scale: Fair     Standing balance support: Bilateral upper extremity supported Standing balance-Leahy Scale: Poor                               Pertinent Vitals/Pain Pain Assessment: No/denies pain  HR 115-140    Home Living Family/patient expects to be discharged to:: Skilled nursing facility Living Arrangements: Spouse/significant other Available Help at Discharge: Available 24 hours/day Type of Home: House Home Access: Stairs to enter Entrance Stairs-Rails: Left Entrance Stairs-Number of Steps: 5 Home Layout: One level Home Equipment: Cane - single point;Other (comment);Bedside commode;Walker - standard      Prior Function Level of Independence: Independent with assistive device(s)         Comments: Prior to 2 weeks ago pt was mod I with RW and since that time wife has been assisting with ADLs     Hand Dominance   Dominant Hand: Right    Extremity/Trunk Assessment   Upper Extremity Assessment: Defer to OT evaluation  Lower Extremity Assessment: Overall WFL for tasks assessed      Cervical / Trunk Assessment: Kyphotic  Communication   Communication: HOH  Cognition Arousal/Alertness: Awake/alert Behavior During Therapy: WFL for tasks assessed/performed Overall Cognitive Status: Within Functional Limits for tasks assessed                      General  Comments      Exercises        Assessment/Plan    PT Assessment Patient needs continued PT services  PT Diagnosis Difficulty walking   PT Problem List Decreased activity tolerance;Decreased knowledge of use of DME;Decreased balance;Decreased mobility;Cardiopulmonary status limiting activity  PT Treatment Interventions Gait training;DME instruction;Functional mobility training;Therapeutic activities;Balance training;Patient/family education;Stair training   PT Goals (Current goals can be found in the Care Plan section) Acute Rehab PT Goals Patient Stated Goal: be able to return home. Get stronger and walk PT Goal Formulation: With patient/family Time For Goal Achievement: 08/20/15 Potential to Achieve Goals: Fair    Frequency Min 3X/week   Barriers to discharge Decreased caregiver support      Co-evaluation PT/OT/SLP Co-Evaluation/Treatment: Yes Reason for Co-Treatment: For patient/therapist safety PT goals addressed during session: Balance;Proper use of DME;Mobility/safety with mobility OT goals addressed during session: ADL's and self-care;Strengthening/ROM       End of Session Equipment Utilized During Treatment: Gait belt;Oxygen Activity Tolerance: Patient tolerated treatment well Patient left: in chair;with call bell/phone within reach;with chair alarm set;with family/visitor present Nurse Communication: Mobility status         Time: 1610-9604 PT Time Calculation (min) (ACUTE ONLY): 20 min   Charges:   PT Evaluation $Initial PT Evaluation Tier I: 1 Procedure     PT G CodesDelorse Lek 08/13/2015, 9:45 AM  Delaney Meigs, PT [phone]

## 2015-08-13 NOTE — Progress Notes (Signed)
[place] TEAM 1 - Stepdown/ICU TEAM PROGRESS NOTE  Bill Walker ZOX:[id number] DOB: 1928/04/19 DOA: 08/09/2015 PCP: Roxy Manns, MD  Admit HPI / Brief Narrative: 81 y.o.M w/ hx of chronic hypoxic respiratory failure due to COPD on 3L O2 home, CAD, HTN, Chronic Systolic CHF, dyslipidemia, and CKD who was brought in by his wife stating he had been progressively declining over a few months w/ loss of appetite and a 60 pound weight loss.  He did not complain of specific localizing symptoms.    HPI/Subjective: The patient is sedate today and will not answer to my exam or questions.  He does not appear to be uncomfortable.  During my visit he is desaturating into the low 80s despite 3 L of nasal cannula oxygen.  When I titrate him up to 6 L his saturations slowly improve into the 88-90% range.  There is no family present at the time of my visit.  Assessment/Plan:  Acute on chronic hypoxic and hypercarbic respiratory failure - multifactorial  -Per wife is on 3 L O2 via [place] at home 24 hours a day -Not making any progress in regards to decreasing oxygen requirements thus far  Bibasilar CAP -Levofloxacin 750 mg continues  Acute bronchospastic exacerbation of COPD  -DuoNeb QID -Solu-Medrol daily -This component appears to be improving with decreased wheeze on exam today  Acute on chronic systolic CHF w/ pulmonary edema  TTE July 2016 w/ EF 35-40% w/ focal WMAs and PA pressure 49mm Hg - baseline wgt ~90kg th/o this year - presently 88.2kg - I/O net negative ~1500cc since admit - continue gentle diuresis as able, though modest hypotension is complicating our efforts  Newly diagnosed Afib w/ RVR New onset of A. fib appreciated last night - calcium channel blocker drip poor choice given significantly diminished systolic function - attempt to titrate to IV scheduled beta blocker - if this is not successful we'll have to consider IV amiodarone though this is also admittedly a poor choice given his  concomitant pulmonary disease  Chronic renal failure  crt 1.85 July 2016, w/ recent values as high as ~2.0 - creatinine continues to slowly increase further complicating attempts at diuresis  Hyponatremia  Corrected with diuresis  HTN As noted above presently hypotensive  CAD Severe LAD and RCA disease by cath Juen 2016 - not an operable candidate - medical tx only option - continue aspirin and Plavix  BPH  Continue Flomax  Dyslipidemia Continue statin  Protein calorie malnutrition Heart healthy diet when alert  Code Status: NO CODE BLUE  Family Communication: No family present at time of exam Disposition Plan: SDU  Consultants: Palliative Care   Procedures: none  Antibiotics: Levaquin 9/26 > Vanc 9/26   DVT prophylaxis: SCDs  Objective: Blood pressure 103/71, pulse 115, temperature 97.8 F (36.6 C), temperature source Axillary, resp. rate 22, height  (1.803 m), weight 88.2 kg (194 lb 7.1 oz), SpO2 97 %.  Intake/Output Summary (Last 24 hours) at 08/13/15 1359 Last data filed at 08/13/15 1224  Gross per 24 hour  Intake 971.33 ml  Output   1450 ml  Net -478.67 ml    Exam: General: Lethargic - requiring 6 L nasal cannula oxygen Lungs: Fine crackles diffusely with no appreciable wheezing Cardiovascular: Regular rate at approximately 65 bpm without appreciable murmur Abdomen: Nontender, nondistended, soft, bowel sounds positive, no rebound, no ascites, no appreciable mass Extremities: No significant cyanosis, or clubbing; 1+ edema bilateral lower extremities  Data Reviewed: Basic Metabolic Panel:  Recent  Labs Lab 08/10/15 0015 08/10/15 0649 08/11/15 0220 08/12/15 0240 08/13/15 0233  NA 129* 129* 132* 132* 138  K 4.6 4.6 4.5 5.1 5.0  CL 78* 77* 79* 77* 83*  CO2  --  43* 43* 45* 44*  GLUCOSE 115* 103* 130* 139* 117*  BUN 42* 39* 48* 51* 53*  CREATININE 1.60* 1.71* 1.89* 1.99* 2.25*  CALCIUM  --  8.6* 8.9 9.2 9.0  MG  --   --  1.8  --   --       CBC:  Recent Labs Lab 08/10/15 0001 08/10/15 0015 08/10/15 0649 08/11/15 0220 08/12/15 0240 08/13/15 0233  WBC 4.6  --  5.9 3.5* 8.6 7.1  NEUTROABS 3.6  --  4.6 3.2 8.2* 6.7  HGB 10.4* 11.6* 10.5* 9.7* 11.7* 10.7*  HCT 32.1* 34.0* 32.7* 29.8* 37.3* 33.4*  MCV 107.4*  --  106.9* 104.2* 106.9* 107.4*  PLT 54*  --  PLATELET CLUMPS NOTED ON SMEAR, COUNT APPEARS DECREASED 68* 101* 79*    Liver Function Tests:  Recent Labs Lab 08/10/15 0649 08/11/15 0220 08/12/15 0240 08/13/15 0233  AST ALT 9* 10* 13* 12*  ALKPHOS 39 36* 48 36*  BILITOT 2.1* 1.2 1.3* 1.1  PROT 5.0* 4.8* 5.8* 4.8*  ALBUMIN 3.1* 2.7* 3.5 3.0*    Recent Results (from the past 240 hour(s))  Blood culture (routine x 2)     Status: None   Collection Time: 08/10/15 12:01 AM  Result Value Ref Range Status   Specimen Description BLOOD LEFT ANTECUBITAL  Final   Special Requests BOTTLES DRAWN AEROBIC ONLY 5CC  Final   Culture  Setup Time   Final    GRAM POSITIVE RODS AEROBIC BOTTLE ONLY CRITICAL RESULT CALLED TO, READ BACK BY AND VERIFIED WITH: Judie Petit GNFAOZH RN [id number] 08/11/15 A BROWNING    Culture   Final    DIPHTHEROIDS(CORYNEBACTERIUM SPECIES) Standardized susceptibility testing for this organism is not available.    Report Status 08/13/2015 FINAL  Final  Blood culture (routine x 2)     Status: None (Preliminary result)   Collection Time: 08/10/15  1:40 AM  Result Value Ref Range Status   Specimen Description BLOOD RIGHT ANTECUBITAL  Final   Special Requests BOTTLES DRAWN AEROBIC AND ANAEROBIC 10CC   Final   Culture NO GROWTH 3 DAYS  Final   Report Status PENDING  Incomplete  MRSA PCR Screening     Status: None   Collection Time: 08/10/15  4:35 AM  Result Value Ref Range Status   MRSA by PCR NEGATIVE NEGATIVE Final    Comment:        The GeneXpert MRSA Assay (FDA approved for NASAL specimens only), is one component of a comprehensive MRSA colonization surveillance program. It is  not intended to diagnose MRSA infection nor to guide or monitor treatment for MRSA infections.      Studies:   Recent x-ray studies have been reviewed in detail by the Attending Physician  Scheduled Meds:  Scheduled Meds: . aspirin  81 mg Oral Daily  . clopidogrel  75 mg Oral Daily  . dextromethorphan-guaiFENesin  1 tablet Oral BID  . feeding supplement (ENSURE ENLIVE)  237 mL Oral BID BM  . furosemide  40 mg Intravenous Q12H  . hydrALAZINE  5 mg Intravenous BID  . imipenem-cilastatin  250 mg Intravenous 4 times per day  . ipratropium-albuterol  3 mL Nebulization QID  . methylPREDNISolone (SOLU-MEDROL) injection  60 mg Intravenous Q24H  .  metoprolol  10 mg Intravenous 4 times per day  . ranolazine  500 mg Oral BID  . simvastatin  20 mg Oral QHS  . sodium chloride  3 mL Intravenous Q12H  . tamsulosin  0.4 mg Oral Daily  . vancomycin  1,250 mg Intravenous Q24H    Time spent on care of this patient: 35 mins   MCCLUNG,JEFFREY T , MD   [facility] Office  [phone] Pager - Text Page per Loretha Stapler as per below:  On-Call/Text Page:      [email or web address]      password [email or web address]  If 7PM-7AM, please contact night-coverage [email or web address] Password [email or web address] 08/13/2015, 1:59 PM   LOS: 3 days

## 2015-08-13 NOTE — Progress Notes (Signed)
CSW (Clinical Child psychotherapist) aware of consult and PT recommendation SNF. CSW also aware of continued family discussions will palliative team. CSW will wait to see if palliative is able to have family meeting with pt sons today and then will approach family about dc planning.  Poonum Ambelal, LCSW [phone]

## 2015-08-13 NOTE — Evaluation (Signed)
Occupational Therapy Evaluation Patient Details Name: Bill Walker MRN: [id number] DOB: Jun 16, 1928 Today's Date: 08/13/2015    History of Present Illness Bill Walker is a 81 y.o. male with Past medical history of COPD chronic respiratory failure on chronic oxygen, coronary artery disease, essential hypertension, chronic systolic CHF, dyslipidemia, chronic kidney disease. Admitted with SOB, progressive weakness, and loss of weight . Dx with acute on chronic CHF and respiratory failure   Clinical Impression   This 81 yo male admitted with above presents to acute OT with decreased endurance, increased need for O2, decreased balance/mobility all affecting his PLOF up until 2 weeks ago of being independent with BADLs. He will benefit from acute OT with follow up OT at SNF to get back to a S level for better.    Follow Up Recommendations  SNF    Equipment Recommendations   (TBD at next venue)       Precautions / Restrictions Precautions Precautions: Fall Precaution Comments: monitor sats, placed on ventrui mask (8 liters) for ambulation (prior to this he was on 6 liters [place]). When back in recliner post ambulation but him back on 6liters [place] (but sats were in mid to upper 80's only so placed pt back on 8 liters Venturi) Restrictions Weight Bearing Restrictions: No      Mobility Bed Mobility Overal bed mobility: Needs Assistance Bed Mobility: Supine to Sit     Supine to sit: Min assist        Transfers Overall transfer level: Needs assistance Equipment used: Rolling walker (2 wheeled) Transfers: Sit to/from Stand Sit to Stand: Min assist;+2 safety/equipment              Balance Overall balance assessment: Needs assistance Sitting-balance support: No upper extremity supported;Feet supported Sitting balance-Leahy Scale: Fair     Standing balance support: Bilateral upper extremity supported Standing balance-Leahy Scale: Poor                               ADL Overall ADL's : Needs assistance/impaired Eating/Feeding: Supervision/ safety;Set up;Sitting (with A to manage venturi mask)   Grooming: Supervision/safety;Set up;Sitting   Upper Body Bathing: Supervision/ safety;Set up;Sitting   Lower Body Bathing: Maximal assistance (with min A sit<>stand)   Upper Body Dressing : Moderate assistance;Sitting   Lower Body Dressing: Total assistance (with min A sit<>stand)   Toilet Transfer: Minimal assistance;Ambulation;RW   Toileting- Clothing Manipulation and Hygiene: Moderate assistance (with min A sit<>stand)         General ADL Comments: All activities take increased time due to increased WOB/decreaed endurance     Vision Additional Comments: No change from baseline          Pertinent Vitals/Pain Pain Assessment: No/denies pain     Hand Dominance Right   Extremity/Trunk Assessment Upper Extremity Assessment Upper Extremity Assessment: Generalized weakness   Lower Extremity Assessment Lower Extremity Assessment: Defer to PT evaluation       Communication Communication Communication: HOH   Cognition Arousal/Alertness: Awake/alert Behavior During Therapy: WFL for tasks assessed/performed Overall Cognitive Status: Within Functional Limits for tasks assessed                                Home Living Family/patient expects to be discharged to:: Skilled nursing facility Living Arrangements: Spouse/significant other  Prior Functioning/Environment Level of Independence: Independent (up until the last 2 weeks)             OT Diagnosis: Generalized weakness   OT Problem List: Decreased strength;Decreased activity tolerance;Impaired balance (sitting and/or standing);Cardiopulmonary status limiting activity   OT Treatment/Interventions: Self-care/ADL training;Patient/family education;Balance training;Energy conservation;DME and/or AE  instruction;Therapeutic activities    OT Goals(Current goals can be found in the care plan section) Acute Rehab OT Goals Patient Stated Goal: to sit down I am weak (while ambulating) OT Goal Formulation: With patient Time For Goal Achievement: 08/27/15 Potential to Achieve Goals: Good  OT Frequency: Min 2X/week           Co-evaluation PT/OT/SLP Co-Evaluation/Treatment: Yes (partial)     OT goals addressed during session: ADL's and self-care;Strengthening/ROM      End of Session Equipment Utilized During Treatment: Gait belt;Rolling walker;Oxygen (8 liters on venturi mask) Nurse Communication: Mobility status (left on 8 liters venturi mask)  Activity Tolerance: Patient limited by fatigue (he did report that he was weak) Patient left: in chair;with call bell/phone within reach;with chair alarm set;with family/visitor present   Time: 1610-9604 OT Time Calculation (min): 25 min Charges:  OT General Charges $OT Visit: 1 Procedure OT Evaluation $Initial OT Evaluation Tier I: 1 Procedure  Evette Georges [phone] 08/13/2015, 9:06 AM

## 2015-08-13 NOTE — Progress Notes (Signed)
Daily Progress Note   Patient Name: Bill Walker       Date: 08/13/2015 DOB: February 10, 1928  Age: 81 y.o. MRN#: [id number] Attending Physician: Lonia Blood, MD Primary Care Physician: Roxy Manns, MD Admit Date: 08/09/2015  Reason for Consultation/Follow-up: Establishing goals of care and Psychosocial/spiritual support  Subjective: Bill Walker is a 81 y.o. male with Past medical history of COPD chronic respiratory failure on chronic oxygen, coronary artery disease, essential hypertension, chronic systolic CHF, dyslipidemia, chronic kidney disease. The patient was brought in by wife as he has been progressively declining over last few months.The patient is coming from home where he lives with his wife and son. At his baseline ambulates with walker and is dependent for most of his ADL; does not manages his medication on his own.He has been on BiPAP, but it can wean nasal cannula today   Interval Events: Noted that patient's wife had spoken with Dr. Joseph Art and patient now DO NOT RESUSCITATE.  I met with Bill Walker and his wife. He was sitting in bedside chair wearing nasal cannula time of examination. No acute distress, however he remained confused and did not participate for large portion of the conversation.  She reports that her sons are not coming to hospital today, and therefore not available to meet for a family meeting.  We discussed what she feels would be in the best interest of her husband moving forward. She believes that he would best be served by transition to skilled facility for rehabilitation. She was not wanting to discuss any other options such as hospice support. We discussed at length what path toward may look like including transition to skilled facility with continued improvement versus transition to skilled facility with him being unable to regain functional status even with aggressive rehabilitation.  I discussed with her that he may be well served by having  palliative medicine follow him on discharge to skilled facility so that he would have continued symptom management as well as further guidance on how best to proceed if he is unable to obtain the hopeful goals of rehabilitation. She was in agreement to this.  - I met with Bill Walker and his wife. Her sons are still not available for family meeting. She is unsure if and when no me coming to the hospital. - His wife and I discussed plan moving forward, and she is agreeable to palliative care follow-up at skilled facility on discharge. I tried to talk with her regarding plan moving forward if he is not able to participate in rehabilitation. She was not willing to engage in this conversation. I recommended palliative care follow-up at skilled facility and she was agreeable to this. - Patient CODE STATUS is DO NOT RESUSCITATE.  Length of Stay: 3 days  Current Medications: Scheduled Meds:  . aspirin  81 mg Oral Daily  . clopidogrel  75 mg Oral Daily  . dextromethorphan-guaiFENesin  1 tablet Oral BID  . feeding supplement (ENSURE ENLIVE)  237 mL Oral BID BM  . furosemide  40 mg Intravenous Q12H  . hydrALAZINE  5 mg Intravenous BID  . imipenem-cilastatin  250 mg Intravenous 4 times per day  . ipratropium-albuterol  3 mL Nebulization QID  . methylPREDNISolone (SOLU-MEDROL) injection  60 mg Intravenous Q24H  . metoprolol  10 mg Intravenous 4 times per day  . ranolazine  500 mg Oral BID  . simvastatin  20 mg Oral QHS  . sodium chloride  3 mL Intravenous Q12H  . tamsulosin  0.4 mg Oral Daily    Continuous Infusions: . diltiazem (CARDIZEM) infusion 10 mg/hr (08/13/15 1400)    PRN Meds: acetaminophen **OR** acetaminophen, albuterol, ondansetron **OR** ondansetron (ZOFRAN) IV  Palliative Performance Scale: 40%     Vital Signs: BP 88/53 mmHg  Pulse 116  Temp(Src) 97.8 F (36.6 C) (Axillary)  Resp 21  Ht $R'5\' 11"'Nu$  (1.803 m)  Wt 88.2 kg (194 lb 7.1 oz)  BMI 27.13 kg/m2  SpO2 99% SpO2: SpO2:  99 % O2 Device: O2 Device: Nasal Cannula O2 Flow Rate: O2 Flow Rate (L/min): 6 L/min (while sleeping)  Intake/output summary:   Intake/Output Summary (Last 24 hours) at 08/13/15 1458 Last data filed at 08/13/15 1224  Gross per 24 hour  Intake 971.33 ml  Output   1450 ml  Net -478.67 ml   LBM:   Baseline Weight: Weight: 83.462 kg (184 lb) Most recent weight: Weight: 88.2 kg (194 lb 7.1 oz)  Physical Exam: Constitutional: elderly frail, lying in bed Resp: Even and non labored, on nasal cannula Cardio: A fib, rate 110  GI: Abd soft non tender Psych: calm, mild confusion, unable to fully assess orientation. Additional Data Reviewed: Recent Labs     08/12/15  0240  08/13/15  0233  WBC  8.6  7.1  HGB  11.7*  10.7*  PLT  101*  79*  NA  132*  138  BUN  51*  53*  CREATININE  1.99*  2.25*     Problem List:  Patient Active Problem List   Diagnosis Date Noted  . CHF (congestive heart failure) 08/10/2015  . Acute on chronic systolic CHF (congestive heart failure) 08/10/2015  . Dyslipidemia 08/10/2015  . Anorexia 08/10/2015  . Loss of weight 08/10/2015  . CAP (community acquired pneumonia) 08/10/2015  . Palliative care encounter   . DNR (do not resuscitate) discussion   . Acute on chronic respiratory failure with hypoxia   . Pulmonary hypertension   . Acute pulmonary edema   . CAD in native artery   . Protein-calorie malnutrition   . Dyspnea 07/20/2015  . Traumatic hematoma of right wrist 06/21/2015  . Multiple skin tears 06/21/2015  . Acute on chronic renal insufficiency 05/09/2015  . Contrast dye induced nephropathy 05/09/2015  . NSTEMI (non-ST elevated myocardial infarction) 05/04/2015  . COLD (chronic obstructive lung disease)   . Unstable angina 05/03/2015  . Ischemic cardiomyopathy   . Chronic systolic CHF (congestive heart failure)   . Coronary artery disease   . Hyperlipidemia   . Thrombocytopenia   . CKD (chronic kidney disease), stage III   . COPD  (chronic obstructive pulmonary disease)   . Cutaneous abscess of chest wall 01/26/2015  . Hematuria 05/12/2014  . Urinary frequency 03/25/2014  . Family history of prostate cancer 03/25/2014  . BPH (benign prostatic hyperplasia) 03/25/2014  . Chronic respiratory failure with hypercapnia 05/14/2013  . PACER/LEAD MECH. COMPLICATION 30/05/6225  . B12 deficiency 07/29/2009  . Elevated lipids 07/29/2009  . Non-STEMI (non-ST elevated myocardial infarction) 07/29/2009  . CARDIOMYOPATHY 07/29/2009  . THROMBOCYTOPENIA 02/19/2009  . GOUT 10/21/2007  . ANEMIA-NOS 10/21/2007  . History of alcohol abuse 10/21/2007  . HEARING LOSS 10/21/2007  . Essential hypertension 10/21/2007  . Coronary atherosclerosis 10/21/2007  . COR PULMONALE 10/21/2007  . 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 DIVERTICULOSIS, COLON 10/21/2007  . Renal insufficiency 10/21/2007  . OSTEOARTHRITIS 10/21/2007     Palliative Care Assessment & Plan    Code Status:  DNR  Goals of Care:  The patient's wife still reports that her sons are not available for family meeting that we have been trying to arrange. His CODE STATUS is DO NOT RESUSCITATE. I discussed the past moving forward with her, and she is agreeable to discharge to skilled facility for rehabilitation with palliative care follow-up. I discussed with her that if her husband is not able to regain functional status as she is hoping that he would likely best be served by transitioning to hospice support. She is agreeable to palliative care follow-up at skilled facility.  Symptom Management:  Patient remains confused. He does deny any symptoms at this time  Psycho-social/Spiritual:  Desire for further Chaplaincy support:no   Prognosis: Unable to determine due to acute illness. Based upon his course to this point in time, I suspect his trajectory would be less than 6 months if it continues along this course. Would therefore qualify for  hospice services if family desires. Discharge Planning: [facility] for rehab with Palliative care service follow-up   Care plan was discussed with patient's wife and bedside nurse  Thank you for allowing the Palliative Medicine Team to assist in the care of this patient.   Time In: 0905 Time Out: 0930 Total Time 25      Greater than 50%  of this time was spent counseling and coordinating care related to the above assessment and plan.   Micheline Rough, MD  08/13/2015, 2:58 PM  Please contact Palliative Medicine Team phone at [phone] for questions and concerns.

## 2015-08-13 NOTE — Care Management Important Message (Signed)
Important Message  Patient Details  Name: Bill Walker MRN: [id number] Date of Birth: 02-May-1928   Medicare Important Message Given:  Yes-second notification given    Orson Aloe 08/13/2015, 3:10 PM

## 2015-08-14 LAB — CBC WITH DIFFERENTIAL/PLATELET
BASOS ABS: 0 10*3/uL (ref 0.0–0.1)
Basophils Relative: 0 %
EOS ABS: 0 10*3/uL (ref 0.0–0.7)
EOS PCT: 0 %
HCT: 32.5 % — ABNORMAL LOW (ref 39.0–52.0)
Hemoglobin: 10.4 g/dL — ABNORMAL LOW (ref 13.0–17.0)
Lymphocytes Relative: 2 %
Lymphs Abs: 0.1 10*3/uL — ABNORMAL LOW (ref 0.7–4.0)
MCH: 34.3 pg — ABNORMAL HIGH (ref 26.0–34.0)
MCHC: 32 g/dL (ref 30.0–36.0)
MCV: 107.3 fL — ABNORMAL HIGH (ref 78.0–100.0)
MONO ABS: 0.1 10*3/uL (ref 0.1–1.0)
MONOS PCT: 2 %
Neutro Abs: 5 10*3/uL (ref 1.7–7.7)
Neutrophils Relative %: 96 %
PLATELETS: 61 10*3/uL — AB (ref 150–400)
RBC: 3.03 MIL/uL — ABNORMAL LOW (ref 4.22–5.81)
RDW: 14.1 % (ref 11.5–15.5)
WBC: 5.2 10*3/uL (ref 4.0–10.5)

## 2015-08-14 LAB — COMPREHENSIVE METABOLIC PANEL
ALBUMIN: 2.9 g/dL — AB (ref 3.5–5.0)
ALK PHOS: 35 U/L — AB (ref 38–126)
ALT: 11 U/L — AB (ref 17–63)
ANION GAP: 7 (ref 5–15)
AST: 17 U/L (ref 15–41)
BILIRUBIN TOTAL: 1 mg/dL (ref 0.3–1.2)
BUN: 60 mg/dL — ABNORMAL HIGH (ref 6–20)
CALCIUM: 9 mg/dL (ref 8.9–10.3)
CO2: 47 mmol/L — AB (ref 22–32)
CREATININE: 2.13 mg/dL — AB (ref 0.61–1.24)
Chloride: 82 mmol/L — ABNORMAL LOW (ref 101–111)
GFR calc Af Amer: 31 mL/min — ABNORMAL LOW (ref >60)
GFR calc non Af Amer: 27 mL/min — ABNORMAL LOW (ref >60)
GLUCOSE: 178 mg/dL — AB (ref 65–99)
Potassium: 4.6 mmol/L (ref 3.5–5.1)
Sodium: 136 mmol/L (ref 135–145)
TOTAL PROTEIN: 5.2 g/dL — AB (ref 6.5–8.1)

## 2015-08-14 MED ORDER — DILTIAZEM HCL 25 MG/5ML IV SOLN
10.0000 mg | Freq: Once | INTRAVENOUS | Status: AC
  Administered 2015-08-14: 10 mg via INTRAVENOUS
  Filled 2015-08-14: qty 5

## 2015-08-14 MED ORDER — METOPROLOL TARTRATE 1 MG/ML IV SOLN
20.0000 mg | Freq: Four times a day (QID) | INTRAVENOUS | Status: DC
  Administered 2015-08-14 – 2015-08-15 (×3): 20 mg via INTRAVENOUS
  Filled 2015-08-14 (×3): qty 20

## 2015-08-14 NOTE — Progress Notes (Signed)
[facility] TEAM 1 - Stepdown/ICU TEAM PROGRESS NOTE  Bill Walker ZOX:[id number] DOB: 1928/02/01 DOA: 08/09/2015 PCP: Roxy Manns, MD  Admit HPI / Brief Narrative: 81 y.o.M w/ hx of chronic hypoxic respiratory failure due to COPD on 3L O2 home, CAD, HTN, Chronic Systolic CHF, dyslipidemia, and CKD who was brought in by his wife stating he had been progressively declining over a few months w/ loss of appetite and a 60 pound weight loss.  He did not complain of specific localizing symptoms.    HPI/Subjective: The pt states he is feeling a little better today.  He states he is less sob, but does c/o feeling "weak in general."  He is much more alert, and denies cp, n/v, or abdom pain.    Assessment/Plan:  Acute on chronic hypoxic and hypercarbic respiratory failure - multifactorial  -Per wife is on 3 L O2 via [place] at home 24 hours a day -slight progress today w/ pt now on 5L [place]   Bibasilar CAP -abx tx continues   Acute bronchospastic exacerbation of COPD  -DuoNeb QID -Solu-Medrol to be discontinued as pt no longer wheezing   Acute on chronic systolic CHF w/ pulmonary edema  TTE July 2016 w/ EF 35-40% w/ focal WMAs and PA pressure 49mm Hg - baseline wgt ~90kg th/o this year - presently 88.9kg - I/O net negative ~1700cc since admit - continue gentle diuresis as able - BP more stable today   Newly diagnosed Afib w/ RVR New onset of A. fib appreciated 9/29ht - calcium channel blocker poor choice given significantly diminished systolic function - transitioned to beta blocker - adjust dosing as rate not yet at goal   Chronic renal failure  crt 1.85 July 2016, w/ recent values as high as ~2.0 - creatinine stable/slightly improved today   Hyponatremia  Corrected with diuresis  HTN BP currently well controlled   CAD Severe LAD and RCA disease by cath June 2016 - not an operable candidate - medical tx only option - continue aspirin and Plavix  BPH  Continue  Flomax  Dyslipidemia Continue statin  Protein calorie malnutrition Heart healthy diet when alert  Code Status: NO CODE BLUE  Family Communication: spoke w/ wife at bedside  Disposition Plan: SDU  Consultants: Palliative Care   Procedures: none  Antibiotics: Levaquin 9/26 > 9/28 Imipenem 9/28 > Vanc 9/26   DVT prophylaxis: SCDs  Objective: Blood pressure 121/65, pulse 124, temperature 98.1 F (36.7 C), temperature source Oral, resp. rate 21, height  (1.803 m), weight 88.9 kg (195 lb 15.8 oz), SpO2 96 %.  Intake/Output Summary (Last 24 hours) at 08/14/15 1152 Last data filed at 08/14/15 0700  Gross per 24 hour  Intake   1120 ml  Output   1250 ml  Net   -130 ml    Exam: General: alert and conversant - currently on 5L [place] w/ sat 94% Lungs: Fine crackles diffusely with no wheezing Cardiovascular: irreg irreg - rate 115 - no M Abdomen: Nontender, nondistended, soft, bowel sounds positive, no rebound, no ascites, no appreciable mass Extremities: No significant cyanosis, or clubbing; 1+ edema bilateral lower extremities  Data Reviewed: Basic Metabolic Panel:  Recent Labs Lab 08/10/15 0649 08/11/15 0220 08/12/15 0240 08/13/15 0233 08/14/15 0240  NA 129* 132* 132* 138 136  K 4.6 4.5 5.1 5.0 4.6  CL 77* 79* 77* 83* 82*  CO2 43* 43* 45* 44* 47*  GLUCOSE 103* 130* 139* 117* 178*  BUN 39* 48* 51* 53* 60*  CREATININE 1.71* 1.89* 1.99* 2.25* 2.13*  CALCIUM 8.6* 8.9 9.2 9.0 9.0  MG  --  1.8  --   --   --     CBC:  Recent Labs Lab 08/10/15 0649 08/11/15 0220 08/12/15 0240 08/13/15 0233 08/14/15 0240  WBC 5.9 3.5* 8.6 7.1 5.2  NEUTROABS 4.6 3.2 8.2* 6.7 5.0  HGB 10.5* 9.7* 11.7* 10.7* 10.4*  HCT 32.7* 29.8* 37.3* 33.4* 32.5*  MCV 106.9* 104.2* 106.9* 107.4* 107.3*  PLT PLATELET CLUMPS NOTED ON SMEAR, COUNT APPEARS DECREASED 68* 101* 79* 61*    Liver Function Tests:  Recent Labs Lab 08/10/15 0649 08/11/15 0220 08/12/15 0240 08/13/15 0233  08/14/15 0240  AST ALT 9* 10* 13* 12* 11*  ALKPHOS 39 36* 48 36* 35*  BILITOT 2.1* 1.2 1.3* 1.1 1.0  PROT 5.0* 4.8* 5.8* 4.8* 5.2*  ALBUMIN 3.1* 2.7* 3.5 3.0* 2.9*    Recent Results (from the past 240 hour(s))  Blood culture (routine x 2)     Status: None   Collection Time: 08/10/15 12:01 AM  Result Value Ref Range Status   Specimen Description BLOOD LEFT ANTECUBITAL  Final   Special Requests BOTTLES DRAWN AEROBIC ONLY 5CC  Final   Culture  Setup Time   Final    GRAM POSITIVE RODS AEROBIC BOTTLE ONLY CRITICAL RESULT CALLED TO, READ BACK BY AND VERIFIED WITH: Judie Petit [id number] RN [id number] 08/11/15 A BROWNING    Culture   Final    DIPHTHEROIDS(CORYNEBACTERIUM SPECIES) Standardized susceptibility testing for this organism is not available.    Report Status 08/13/2015 FINAL  Final  Blood culture (routine x 2)     Status: None (Preliminary result)   Collection Time: 08/10/15  1:40 AM  Result Value Ref Range Status   Specimen Description BLOOD RIGHT ANTECUBITAL  Final   Special Requests BOTTLES DRAWN AEROBIC AND ANAEROBIC 10CC   Final   Culture NO GROWTH 3 DAYS  Final   Report Status PENDING  Incomplete  MRSA PCR Screening     Status: None   Collection Time: 08/10/15  4:35 AM  Result Value Ref Range Status   MRSA by PCR NEGATIVE NEGATIVE Final    Comment:        The GeneXpert MRSA Assay (FDA approved for NASAL specimens only), is one component of a comprehensive MRSA colonization surveillance program. It is not intended to diagnose MRSA infection nor to guide or monitor treatment for MRSA infections.      Studies:   Recent x-ray studies have been reviewed in detail by the Attending Physician  Scheduled Meds:  Scheduled Meds: . aspirin  81 mg Oral Daily  . clopidogrel  75 mg Oral Daily  . dextromethorphan-guaiFENesin  1 tablet Oral BID  . feeding supplement (ENSURE ENLIVE)  237 mL Oral BID BM  . furosemide  20 mg Intravenous Q12H  . hydrALAZINE  5 mg  Intravenous BID  . imipenem-cilastatin  250 mg Intravenous 4 times per day  . ipratropium  0.5 mg Nebulization Q6H  . levalbuterol  0.63 mg Nebulization Q6H  . metoprolol  15 mg Intravenous 4 times per day  . simvastatin  20 mg Oral QHS  . sodium chloride  3 mL Intravenous Q12H  . tamsulosin  0.4 mg Oral Daily    Time spent on care of this patient: 35 mins   MCCLUNG,JEFFREY T , MD   [facility] Office  [phone] Pager - Text Page per Loretha Stapler as per below:  On-Call/Text Page:      [email or web address]      password [email or web address]  If 7PM-7AM, please contact night-coverage [email or web address] Password [email or web address] 08/14/2015, 11:52 AM   LOS: 4 days

## 2015-08-14 NOTE — Progress Notes (Signed)
After one dose of cardizem . IV patients HR gradually came down to the 100-110's for short period and up again to the 120's but non-sustaining. Will give due Lopressor Iv this am, will continue to monitor.

## 2015-08-14 NOTE — Progress Notes (Signed)
Patients HR sustaining in the 120's-140's atrial fib. BP stable patient appears comfortable denies any pain, M. LynchNP was notified with orders made. Will continue to monitor.

## 2015-08-15 ENCOUNTER — Inpatient Hospital Stay (HOSPITAL_COMMUNITY): Payer: Medicare Other

## 2015-08-15 LAB — COMPREHENSIVE METABOLIC PANEL
ALK PHOS: 32 U/L — AB (ref 38–126)
ALT: 9 U/L — ABNORMAL LOW (ref 17–63)
ANION GAP: 10 (ref 5–15)
AST: 15 U/L (ref 15–41)
Albumin: 3 g/dL — ABNORMAL LOW (ref 3.5–5.0)
BILIRUBIN TOTAL: 1 mg/dL (ref 0.3–1.2)
BUN: 56 mg/dL — ABNORMAL HIGH (ref 6–20)
CALCIUM: 9 mg/dL (ref 8.9–10.3)
CO2: 45 mmol/L — ABNORMAL HIGH (ref 22–32)
Chloride: 82 mmol/L — ABNORMAL LOW (ref 101–111)
Creatinine, Ser: 1.84 mg/dL — ABNORMAL HIGH (ref 0.61–1.24)
GFR, EST AFRICAN AMERICAN: 37 mL/min — AB (ref >60)
GFR, EST NON AFRICAN AMERICAN: 32 mL/min — AB (ref >60)
Glucose, Bld: 106 mg/dL — ABNORMAL HIGH (ref 65–99)
POTASSIUM: 4.2 mmol/L (ref 3.5–5.1)
Sodium: 137 mmol/L (ref 135–145)
TOTAL PROTEIN: 4.9 g/dL — AB (ref 6.5–8.1)

## 2015-08-15 LAB — CBC WITH DIFFERENTIAL/PLATELET
BASOS ABS: 0 10*3/uL (ref 0.0–0.1)
BASOS PCT: 0 %
EOS PCT: 0 %
Eosinophils Absolute: 0 10*3/uL (ref 0.0–0.7)
HEMATOCRIT: 34.6 % — AB (ref 39.0–52.0)
Hemoglobin: 10.9 g/dL — ABNORMAL LOW (ref 13.0–17.0)
LYMPHS PCT: 6 %
Lymphs Abs: 0.4 10*3/uL — ABNORMAL LOW (ref 0.7–4.0)
MCH: 34.4 pg — ABNORMAL HIGH (ref 26.0–34.0)
MCHC: 31.5 g/dL (ref 30.0–36.0)
MCV: 109.1 fL — AB (ref 78.0–100.0)
MONO ABS: 0.7 10*3/uL (ref 0.1–1.0)
Monocytes Relative: 9 %
NEUTROS ABS: 6 10*3/uL (ref 1.7–7.7)
Neutrophils Relative %: 85 %
PLATELETS: 60 10*3/uL — AB (ref 150–400)
RBC: 3.17 MIL/uL — AB (ref 4.22–5.81)
RDW: 14.2 % (ref 11.5–15.5)
WBC: 7.1 10*3/uL (ref 4.0–10.5)

## 2015-08-15 LAB — CULTURE, BLOOD (ROUTINE X 2): CULTURE: NO GROWTH

## 2015-08-15 MED ORDER — METOPROLOL TARTRATE 1 MG/ML IV SOLN
10.0000 mg | Freq: Four times a day (QID) | INTRAVENOUS | Status: DC
  Administered 2015-08-15 – 2015-08-16 (×5): 10 mg via INTRAVENOUS
  Filled 2015-08-15 (×7): qty 10

## 2015-08-15 MED ORDER — AMIODARONE HCL IN DEXTROSE 360-4.14 MG/200ML-% IV SOLN
INTRAVENOUS | Status: AC
  Filled 2015-08-15: qty 200

## 2015-08-15 MED ORDER — AMIODARONE HCL IN DEXTROSE 360-4.14 MG/200ML-% IV SOLN
30.0000 mg/h | INTRAVENOUS | Status: DC
  Administered 2015-08-15 – 2015-08-18 (×5): 30 mg/h via INTRAVENOUS
  Filled 2015-08-15 (×7): qty 200

## 2015-08-15 MED ORDER — AMIODARONE HCL IN DEXTROSE 360-4.14 MG/200ML-% IV SOLN
60.0000 mg/h | INTRAVENOUS | Status: AC
  Administered 2015-08-15 (×2): 60 mg/h via INTRAVENOUS

## 2015-08-15 MED ORDER — LEVALBUTEROL HCL 0.63 MG/3ML IN NEBU
0.6300 mg | INHALATION_SOLUTION | Freq: Two times a day (BID) | RESPIRATORY_TRACT | Status: DC
  Administered 2015-08-16 – 2015-08-19 (×7): 0.63 mg via RESPIRATORY_TRACT
  Filled 2015-08-15 (×7): qty 3

## 2015-08-15 MED ORDER — IPRATROPIUM BROMIDE 0.02 % IN SOLN
0.5000 mg | Freq: Two times a day (BID) | RESPIRATORY_TRACT | Status: DC
  Administered 2015-08-16 – 2015-08-19 (×7): 0.5 mg via RESPIRATORY_TRACT
  Filled 2015-08-15 (×7): qty 2.5

## 2015-08-15 MED ORDER — AMIODARONE LOAD VIA INFUSION
150.0000 mg | Freq: Once | INTRAVENOUS | Status: AC
  Administered 2015-08-15: 150 mg via INTRAVENOUS
  Filled 2015-08-15: qty 83.34

## 2015-08-15 NOTE — Progress Notes (Signed)
ANTIBIOTIC CONSULT NOTE - Follow Up  Pharmacy Consult for imipenem/cilastatin  Indication: bacteremia  Allergies  Allergen Reactions  . Doxazosin Mesylate Rash    rash  . Penicillins Other (See Comments)    REACTION: unknown reaction    Patient Measurements: Height:  (180.3 cm) Weight: 191 lb 12.8 oz (87 kg) IBW/kg (Calculated) : 75.3  Vital Signs: Temp: 98 F (36.7 C) (10/02 0700) Temp Source: Oral (10/02 0700) BP: 132/51 mmHg (10/02 1100) Pulse Rate: 134 (10/02 1100) Intake/Output from previous day: 10/01 0701 - 10/02 0700 In: 320 [P.O.:120; IV Piggyback:200] Out: 1850 [Urine:1850] Intake/Output from this shift:    Labs:  Recent Labs  08/13/15 0233 08/14/15 0240 08/15/15 0225  WBC 7.1 5.2 7.1  HGB 10.7* 10.4* 10.9*  PLT 79* 61* 60*  CREATININE 2.25* 2.13* 1.84*   Estimated Creatinine Clearance: 31.3 mL/min (by C-G formula based on Cr of 1.84). No results for input(s): VANCOTROUGH, VANCOPEAK, VANCORANDOM, GENTTROUGH, GENTPEAK, GENTRANDOM, TOBRATROUGH, TOBRAPEAK, TOBRARND, AMIKACINPEAK, AMIKACINTROU, AMIKACIN in the last 72 hours.   Microbiology: Recent Results (from the past 720 hour(s))  Blood culture (routine x 2)     Status: None   Collection Time: 08/10/15 12:01 AM  Result Value Ref Range Status   Specimen Description BLOOD LEFT ANTECUBITAL  Final   Special Requests BOTTLES DRAWN AEROBIC ONLY 5CC  Final   Culture  Setup Time   Final    GRAM POSITIVE RODS AEROBIC BOTTLE ONLY CRITICAL RESULT CALLED TO, READ BACK BY AND VERIFIED WITH: Judie Petit [id number] RN [id number] 08/11/15 A BROWNING    Culture   Final    DIPHTHEROIDS(CORYNEBACTERIUM SPECIES) Standardized susceptibility testing for this organism is not available.    Report Status 08/13/2015 FINAL  Final  Blood culture (routine x 2)     Status: None (Preliminary result)   Collection Time: 08/10/15  1:40 AM  Result Value Ref Range Status   Specimen Description BLOOD RIGHT ANTECUBITAL  Final   Special  Requests BOTTLES DRAWN AEROBIC AND ANAEROBIC 10CC   Final   Culture NO GROWTH 4 DAYS  Final   Report Status PENDING  Incomplete  MRSA PCR Screening     Status: None   Collection Time: 08/10/15  4:35 AM  Result Value Ref Range Status   MRSA by PCR NEGATIVE NEGATIVE Final    Comment:        The GeneXpert MRSA Assay (FDA approved for NASAL specimens only), is one component of a comprehensive MRSA colonization surveillance program. It is not intended to diagnose MRSA infection nor to guide or monitor treatment for MRSA infections.     Medical History: Past Medical History  Diagnosis Date  . Ischemic cardiomyopathy     a. 02/2005 EF 32% (myoview report).  . Hypertension   . Coronary artery disease     a. s/p prior MI w/ RCA stenting;  b. 03/2011 Cath: LM 20d, LAD 40p/100p, LCX 40p, L->L collats to distal LAD, RCA dominant, patent prox stent, 84m ISR, 50-14m, 50-60d, 60d-->Med Rx;  c. 04/2015 NSTEMI/Cath: LM 45, LAD 80/76m, 100d (L->L collats), D2 99ost, OM1 60, RCA patent stent prox, 8m, 70d, RPAV1/2 70->Med Rx.  Marland Kitchen Hyperlipidemia   . Vitamin B12 deficiency   . History of pneumonia   . Thrombocytopenia   . Cor pulmonale   . Hearing loss   . Alcohol abuse, unspecified   . Tobacco use disorder   . CKD (chronic kidney disease), stage III   . Osteoarthritis   . Gout   .  Diverticulosis of colon   . COPD (chronic obstructive pulmonary disease)   . Anemia   . Bronchiectasis     CT Chest 07/07/09 bilateral lower lobe medial bronchiectasis  . Chronic systolic CHF (congestive heart failure)     a. 02/2005 EF 32% (myoview report).    Medications:  Prescriptions prior to admission  Medication Sig Dispense Refill Last Dose  . amLODipine (NORVASC) 5 MG tablet Take 5 mg by mouth daily.   08/09/2015 at Unknown time  . aspirin 81 MG tablet Take 1 tablet (81 mg total) by mouth daily.   08/09/2015 at Unknown time  . calcium carbonate (TUMS - DOSED IN MG ELEMENTAL CALCIUM) 500 MG chewable tablet  Chew 1 tablet by mouth daily.   08/09/2015 at Unknown time  . cholecalciferol (VITAMIN D) 1000 UNITS tablet Take 1 tablet (1,000 Units total) by mouth daily.   08/09/2015 at Unknown time  . clopidogrel (PLAVIX) 75 MG tablet TAKE ONE TABLET BY MOUTH ONCE DAILY 90 tablet 3 08/09/2015 at Unknown time  . diphenhydrAMINE (SOMINEX) 25 MG tablet Take 50 mg by mouth at bedtime as needed for sleep.    08/09/2015 at Unknown time  . furosemide (LASIX) 80 MG tablet TAKE 1 TABLET BY MOUTH DAILY & TAKE 1/2 TAB BY MOUTH IN THE EVENINGS ON Monday, Wed & Friday ONLY.   08/08/2015 at Unknown time  . isosorbide mononitrate (IMDUR) 30 MG 24 hr tablet TAKE TWO TABLETS BY MOUTH ONCE DAILY 180 tablet 1 08/09/2015 at Unknown time  . metoprolol succinate (TOPROL-XL) 100 MG 24 hr tablet Take 1 tablet (100 mg total) by mouth daily. Take with or immediately following a meal. 90 tablet 3 08/09/2015 at 0800  . nitroGLYCERIN (NITROSTAT) 0.4 MG SL tablet Place 0.4 mg under the tongue every 5 (five) minutes as needed for chest pain (up to 3 doses).   unknown  . ranolazine (RANEXA) 500 MG 12 hr tablet Take 1 tablet (500 mg total) by mouth 2 (two) times daily. 60 tablet 6 08/09/2015 at Unknown time  . simvastatin (ZOCOR) 20 MG tablet TAKE ONE TABLET BY MOUTH AT BEDTIME 90 tablet 1 08/09/2015 at Unknown time  . tamsulosin (FLOMAX) 0.4 MG CAPS capsule Take 1 capsule (0.4 mg total) by mouth daily. 90 capsule 3 08/09/2015 at Unknown time  . tiotropium (SPIRIVA HANDIHALER) 18 MCG inhalation capsule INHALE ONE CAPSULE BY MOUTH ONCE DAILY 90 capsule 3 08/09/2015 at Unknown time  . vitamin B-12 (CYANOCOBALAMIN) 1000 MCG tablet Take 1,000 mcg by mouth daily.   08/09/2015 at Unknown time  . NON FORMULARY Oxygen- 3 lpm 24/7   Taking   Assessment: 81 y.o. male presents with SOB started on levofloxacin for CAP that was broadened to vanc/Primaxin for positive blood cx. Pharmacy consulted to dose imipenem/cilastatin.  ID: Day #6 abx on imipenem/cilastatin.  WBC wnl, afebrile. Cr 1.84, CrCl ~31 ml/min.  9/27 Levaquin>>9/29 9/29 Vanc>>9/30 9/29 Primaxin>>  9/27 blood x2>> 1/2 gram positive rods>>diphtheroids (corynebacterium)  Goal of Therapy:  Resolution of infection  Plan:  Continue imipenem/cilastatin 250 mg IV q6h Monitor renal function, clinical improvement F/u duration of abx therapy    Hillery Aldo, Pharm.D. PGY2 Pharmacy Resident Pager: [phone]  08/15/2015,12:04 PM

## 2015-08-15 NOTE — Progress Notes (Signed)
Patient placed back on bipap per MD due to altered mental status. Will continue to monitor.

## 2015-08-15 NOTE — Progress Notes (Signed)
[facility] TEAM 1 - Stepdown/ICU TEAM PROGRESS NOTE  Bill Walker ZOX:[id number] DOB: 1928-01-23 DOA: 08/09/2015 PCP: Roxy Manns, MD  Admit HPI / Brief Narrative: 81 y.o.M w/ hx of chronic hypoxic respiratory failure due to COPD on 3L O2 home, CAD, HTN, Chronic Systolic CHF, dyslipidemia, and CKD who was brought in by his wife stating he had been progressively declining over a few months w/ loss of appetite and a 60 pound weight loss.  He did not complain of specific localizing symptoms.    HPI/Subjective: The patient has taken a turn for the worse today.  Though he was very much alert and interactive yesterday today he is quite lethargic.  His eyes are open but he does not follow the examiner.  He will follow very simple commands such as lift your hand off the bed.  His respiratory effort appears to be quite diminished.  Yesterday  Assessment/Plan:  Acute on chronic hypoxic and hypercarbic respiratory failure - multifactorial  -Per wife is on 3 L O2 via [place] at home 24 hours a day -Respiratory status has declined significantly today - I fear the patient is tiring out given that he has been in varying degrees of respiratory distress since his admission many days ago - we will give him a trial of BiPAP - I have discussed with his wife in a very frank fashion that I fear there may be little we can do to reverse his present decline  Bibasilar CAP -abx tx continues   Acute bronchospastic exacerbation of COPD  -DuoNeb QID -Solu-Medrol discontinued as pt no longer wheezing  -No wheezing on exam today - do not feel this is contributing to his immediate decline presently  Acute on chronic systolic CHF w/ pulmonary edema  TTE July 2016 w/ EF 35-40% w/ focal WMAs and PA pressure 49mm Hg - baseline wgt ~90kg th/o this year - presently 87kg - I/O net negative ~3200cc since admit - continue gentle diuresis as able - BP stable presently   Newly diagnosed Afib w/ RVR New onset of A. fib appreciated  9/29 - calcium channel blocker poor choice given significantly diminished systolic function - transitioned to beta blocker - rate remains poorly controlled - initiate amiodarone drip today - no anticoagulation at this time due to thrombocytopenia   Chronic renal failure  crt 1.85 July 2016, w/ recent values as high as ~2.0 - creatinine continues to slowly improve    Hyponatremia  Corrected with diuresis  HTN No change in tx plan today   CAD Severe LAD and RCA disease by cath June 2016 - not an operable candidate - medical tx only option - continue aspirin and Plavix  BPH  Continue Flomax  Dyslipidemia Continue statin  Protein calorie malnutrition Heart healthy diet when alert - npo today   Code Status: NO CODE BLUE  Family Communication: spoke w/ wife at bedside  Disposition Plan: SDU  Consultants: Palliative Care   Procedures: none  Antibiotics: Levaquin 9/26 > 9/28 Imipenem 9/28 > Vanc 9/26   DVT prophylaxis: SCDs  Objective: Blood pressure 140/107, pulse 152, temperature 98 F (36.7 C), temperature source Oral, resp. rate 19, height  (1.803 m), weight 87 kg (191 lb 12.8 oz), SpO2 100 %.  Intake/Output Summary (Last 24 hours) at 08/15/15 1128 Last data filed at 08/15/15 0527  Gross per 24 hour  Intake    320 ml  Output   1850 ml  Net  -1530 ml    Exam: General: very lethargic -  very poor ventilation  Lungs: Very poor air movement throughout all fields with no active wheeze - diminished respiratory effort Cardiovascular: irreg irreg - rate 140 - no M Abdomen: Nondistended, soft, bowel sounds positive, no rebound, no ascites, no appreciable mass Extremities: No significant cyanosis, or clubbing; trace edema bilateral lower extremities Neuro:  No facial asymmetry, pupils constricted bilaterally but equal, moves both hands on command equally, does not attempt to move lower extremities despite commands  Data Reviewed: Basic Metabolic Panel:  Recent  Labs Lab 08/11/15 0220 08/12/15 0240 08/13/15 0233 08/14/15 0240 08/15/15 0225  NA 132* 132* 138 136 137  K 4.5 5.1 5.0 4.6 4.2  CL 79* 77* 83* 82* 82*  CO2 43* 45* 44* 47* 45*  GLUCOSE 130* 139* 117* 178* 106*  BUN 48* 51* 53* 60* 56*  CREATININE 1.89* 1.99* 2.25* 2.13* 1.84*  CALCIUM 8.9 9.2 9.0 9.0 9.0  MG 1.8  --   --   --   --     CBC:  Recent Labs Lab 08/11/15 0220 08/12/15 0240 08/13/15 0233 08/14/15 0240 08/15/15 0225  WBC 3.5* 8.6 7.1 5.2 7.1  NEUTROABS 3.2 8.2* 6.7 5.0 6.0  HGB 9.7* 11.7* 10.7* 10.4* 10.9*  HCT 29.8* 37.3* 33.4* 32.5* 34.6*  MCV 104.2* 106.9* 107.4* 107.3* 109.1*  PLT 68* 101* 79* 61* 60*    Liver Function Tests:  Recent Labs Lab 08/11/15 0220 08/12/15 0240 08/13/15 0233 08/14/15 0240 08/15/15 0225  AST ALT 10* 13* 12* 11* 9*  ALKPHOS 36* 48 36* 35* 32*  BILITOT 1.2 1.3* 1.1 1.0 1.0  PROT 4.8* 5.8* 4.8* 5.2* 4.9*  ALBUMIN 2.7* 3.5 3.0* 2.9* 3.0*    Recent Results (from the past 240 hour(s))  Blood culture (routine x 2)     Status: None   Collection Time: 08/10/15 12:01 AM  Result Value Ref Range Status   Specimen Description BLOOD LEFT ANTECUBITAL  Final   Special Requests BOTTLES DRAWN AEROBIC ONLY 5CC  Final   Culture  Setup Time   Final    GRAM POSITIVE RODS AEROBIC BOTTLE ONLY CRITICAL RESULT CALLED TO, READ BACK BY AND VERIFIED WITH: Judie Petit ZOXWRUE RN [id number] 08/11/15 A BROWNING    Culture   Final    DIPHTHEROIDS(CORYNEBACTERIUM SPECIES) Standardized susceptibility testing for this organism is not available.    Report Status 08/13/2015 FINAL  Final  Blood culture (routine x 2)     Status: None (Preliminary result)   Collection Time: 08/10/15  1:40 AM  Result Value Ref Range Status   Specimen Description BLOOD RIGHT ANTECUBITAL  Final   Special Requests BOTTLES DRAWN AEROBIC AND ANAEROBIC 10CC   Final   Culture NO GROWTH 4 DAYS  Final   Report Status PENDING  Incomplete  MRSA PCR Screening     Status:  None   Collection Time: 08/10/15  4:35 AM  Result Value Ref Range Status   MRSA by PCR NEGATIVE NEGATIVE Final    Comment:        The GeneXpert MRSA Assay (FDA approved for NASAL specimens only), is one component of a comprehensive MRSA colonization surveillance program. It is not intended to diagnose MRSA infection nor to guide or monitor treatment for MRSA infections.      Studies:   Recent x-ray studies have been reviewed in detail by the Attending Physician  Scheduled Meds:  Scheduled Meds: . aspirin  81 mg Oral Daily  . clopidogrel  75 mg Oral Daily  .  dextromethorphan-guaiFENesin  1 tablet Oral BID  . feeding supplement (ENSURE ENLIVE)  237 mL Oral BID BM  . furosemide  20 mg Intravenous Q12H  . hydrALAZINE  5 mg Intravenous BID  . imipenem-cilastatin  250 mg Intravenous 4 times per day  . ipratropium  0.5 mg Nebulization Q6H  . levalbuterol  0.63 mg Nebulization Q6H  . metoprolol  20 mg Intravenous 4 times per day  . simvastatin  20 mg Oral QHS  . sodium chloride  3 mL Intravenous Q12H  . tamsulosin  0.4 mg Oral Daily    Time spent on care of this patient: 35 mins   Avani Sensabaugh T , MD   [facility] Office  [phone] Pager - Text Page per Loretha Stapler as per below:  On-Call/Text Page:      [email or web address]      password [email or web address]  If 7PM-7AM, please contact night-coverage [email or web address] Password [email or web address] 08/15/2015, 11:28 AM   LOS: 5 days

## 2015-08-15 NOTE — Consult Note (Signed)
  Amiodarone Drug - Drug Interaction Consult Note  Recommendations: Consider discontinuation of metoprolol if rate controlled HR < 110 bpm on amio gtt. Continue current simvastatin dose, monitor for s/sx myalgia and elevation of LFTs.   Amiodarone is metabolized by the cytochrome P450 system and therefore has the potential to cause many drug interactions. Amiodarone has an average plasma half-life of 50 days (range 20 to 100 days).   There is potential for drug interactions to occur several weeks or months after stopping treatment and the onset of drug interactions may be slow after initiating amiodarone.    Statins: Increased risk of myopathy. Simvastatin- restrict dose to  daily. Other statins: counsel patients to report any muscle pain or weakness immediately.   Anticoagulants: Amiodarone can increase anticoagulant effect. Consider warfarin dose reduction. Patients should be monitored closely and the dose of anticoagulant altered accordingly, remembering that amiodarone levels take several weeks to stabilize.   Antiepileptics: Amiodarone can increase plasma concentration of phenytoin, the dose should be reduced. Note that small changes in phenytoin dose can result in large changes in levels. Monitor patient and counsel on signs of toxicity.   Beta blockers: increased risk of bradycardia, AV block and myocardial depression. Sotalol - avoid concomitant use.    Calcium channel blockers (diltiazem and verapamil): increased risk of bradycardia, AV block and myocardial depression.    Cyclosporine: Amiodarone increases levels of cyclosporine. Reduced dose of cyclosporine is recommended.   Digoxin dose should be halved when amiodarone is started.   Diuretics: increased risk of cardiotoxicity if hypokalemia occurs.   Oral hypoglycemic agents (glyburide, glipizide, glimepiride): increased risk of hypoglycemia. Patient's glucose levels should be monitored closely when  initiating amiodarone therapy.    Drugs that prolong the QT interval:  Torsades de pointes risk may be increased with concurrent use - avoid if possible.  Monitor QTc, also keep magnesium/potassium WNL if concurrent therapy can't be avoided. Marland Kitchen Antibiotics: e.g. fluoroquinolones, erythromycin. . Antiarrhythmics: e.g. quinidine, procainamide, disopyramide, sotalol. . Antipsychotics: e.g. phenothiazines, haloperidol.  . Lithium, tricyclic antidepressants, and methadone. Thank Arne Cleveland  08/15/2015 12:17 PM

## 2015-08-15 NOTE — Progress Notes (Signed)
Patient with altered mental status,and elevated heart rate in the 150's Dr. Jim Like made aware and at the bedside. Per MD Amiodarone started and bolus given. Wife at the bedside.  Corliss Skains RN

## 2015-08-15 NOTE — Progress Notes (Signed)
I stopped by to check on Bill Walker and his wife. He has been placed back on BiPAP.  His wife reports, "he's not doing too good."  She endorses having difficult conversation with his primary physician today and that she understands that he carries a poor prognosis.  She did not really want to engage in any further conversations this evening.  She was agreeable to me following up tomorrow to see how he is doing and understands that we will need to continue to talk about the fact that he is not improving despite continued therapy.  Romie Minus, MD [facility] Palliative Medicine Team [phone]

## 2015-08-16 LAB — COMPREHENSIVE METABOLIC PANEL
ALBUMIN: 2.7 g/dL — AB (ref 3.5–5.0)
ALT: 7 U/L — ABNORMAL LOW (ref 17–63)
ANION GAP: 8 (ref 5–15)
AST: 14 U/L — AB (ref 15–41)
Alkaline Phosphatase: 34 U/L — ABNORMAL LOW (ref 38–126)
BILIRUBIN TOTAL: 1.1 mg/dL (ref 0.3–1.2)
BUN: 50 mg/dL — AB (ref 6–20)
CHLORIDE: 83 mmol/L — AB (ref 101–111)
CO2: 47 mmol/L — ABNORMAL HIGH (ref 22–32)
Calcium: 8.7 mg/dL — ABNORMAL LOW (ref 8.9–10.3)
Creatinine, Ser: 1.65 mg/dL — ABNORMAL HIGH (ref 0.61–1.24)
GFR calc Af Amer: 42 mL/min — ABNORMAL LOW (ref >60)
GFR, EST NON AFRICAN AMERICAN: 36 mL/min — AB (ref >60)
Glucose, Bld: 91 mg/dL (ref 65–99)
POTASSIUM: 4 mmol/L (ref 3.5–5.1)
Sodium: 138 mmol/L (ref 135–145)
TOTAL PROTEIN: 4.5 g/dL — AB (ref 6.5–8.1)

## 2015-08-16 LAB — CBC WITH DIFFERENTIAL/PLATELET
BASOS ABS: 0 10*3/uL (ref 0.0–0.1)
BASOS PCT: 0 %
EOS PCT: 0 %
Eosinophils Absolute: 0 10*3/uL (ref 0.0–0.7)
HCT: 35.4 % — ABNORMAL LOW (ref 39.0–52.0)
Hemoglobin: 10.8 g/dL — ABNORMAL LOW (ref 13.0–17.0)
Lymphocytes Relative: 4 %
Lymphs Abs: 0.3 10*3/uL — ABNORMAL LOW (ref 0.7–4.0)
MCH: 33.1 pg (ref 26.0–34.0)
MCHC: 30.5 g/dL (ref 30.0–36.0)
MCV: 108.6 fL — AB (ref 78.0–100.0)
MONO ABS: 0.7 10*3/uL (ref 0.1–1.0)
MONOS PCT: 10 %
Neutro Abs: 5.7 10*3/uL (ref 1.7–7.7)
Neutrophils Relative %: 86 %
PLATELETS: 67 10*3/uL — AB (ref 150–400)
RBC: 3.26 MIL/uL — ABNORMAL LOW (ref 4.22–5.81)
RDW: 14 % (ref 11.5–15.5)
WBC: 6.7 10*3/uL (ref 4.0–10.5)

## 2015-08-16 MED ORDER — METOPROLOL TARTRATE 1 MG/ML IV SOLN
15.0000 mg | Freq: Four times a day (QID) | INTRAVENOUS | Status: DC
  Administered 2015-08-17 – 2015-08-18 (×4): 15 mg via INTRAVENOUS
  Filled 2015-08-16 (×5): qty 15

## 2015-08-16 NOTE — Clinical Social Work Placement (Signed)
   CLINICAL SOCIAL WORK PLACEMENT  NOTE  Date:  08/16/2015  Patient Details  Name: Bill Walker MRN: [id number] Date of Birth: 01-03-1928  Clinical Social Work is seeking post-discharge placement for this patient at the Skilled  Nursing Facility level of care (*CSW will initial, date and re-position this form in  chart as items are completed):  Yes   Patient/family provided with [facility] Clinical Social Work Department's list of facilities offering this level of care within the geographic area requested by the patient (or if unable, by the patient's family).  Yes   Patient/family informed of their freedom to choose among providers that offer the needed level of care, that participate in Medicare, Medicaid or managed care program needed by the patient, have an available bed and are willing to accept the patient.  Yes   Patient/family informed of [facility]'s ownership interest in [facility] and [facility], as well as of the fact that they are under no obligation to receive care at these facilities.  PASRR submitted to EDS on       PASRR number received on       Existing PASRR number confirmed on 08/16/15     FL2 transmitted to all facilities in geographic area requested by pt/family on 08/16/15     FL2 transmitted to all facilities within larger geographic area on       Patient informed that his/her managed care company has contracts with or will negotiate with certain facilities, including the following:            Patient/family informed of bed offers received.  Patient chooses bed at       Physician recommends and patient chooses bed at      Patient to be transferred to   on  .  Patient to be transferred to facility by       Patient family notified on   of transfer.  Name of family member notified:        PHYSICIAN       Additional Comment:    _______________________________________________ Roddie Mc MSW, LCSW, [place], [phone]

## 2015-08-16 NOTE — Progress Notes (Addendum)
Pt not in distress at this time, pt on 3L nasal cannula and tolerating well. No need for BiPAP at this time, vital signs are all within normal range. RT will continue to monitor

## 2015-08-16 NOTE — Clinical Documentation Improvement (Signed)
Hospitalist   Query 1 of 2 Please document the clinical significance, if any, for the following abnormal lab results this admission: 1 of 2 blood cultures + for Diphtheroids (Corynebacterium species)  Query 2 of 2 Possible Clinical Conditions:  - Acute Encephalopathy, including type  - Other condition  - Unable to clinically determine  Clinical Information: "Altered mental status" is documented in the progress notes. "The patient has taken a turn for the worse today. Though he was very much alert and interactive yesterday today he is quite lethargic. His eyes are open but he does not follow the examiner. He will follow very simple commands such as lift your hand off the bed. His respiratory effort appears to be quite diminished." is documented in the progress note dated 08/15/15 by Dr. Sharon Seller.   (please document your response in the progress notes and not on the query form itself.)   Please exercise your independent, professional judgment when responding. A specific answer is not anticipated or expected.   Thank You, Jerral Ralph  RN BSN CCDS [phone] Health Information Management [place]

## 2015-08-16 NOTE — Progress Notes (Signed)
[place] TEAM 1 - Stepdown/ICU TEAM PROGRESS NOTE  Nandan Willems BJY:[id number] DOB: Jul 18, 1928 DOA: 08/09/2015 PCP: Roxy Manns, MD  Admit HPI / Brief Narrative: 81 y.o.M w/ hx of chronic hypoxic respiratory failure due to COPD on 3L O2 home, CAD, HTN, Chronic Systolic CHF, dyslipidemia, and CKD who was brought in by his wife stating he had been progressively declining over a few months w/ loss of appetite and a 60 pound weight loss.  He did not complain of specific localizing symptoms.    HPI/Subjective: Pt is much more alert and interactive today.  He answers my questions, and denies focal pain or sob. He is off BIPAP and tolerating [place] only w/ sats at 99%.    Assessment/Plan:  Acute on chronic hypoxic and hypercarbic respiratory failure - multifactorial  -Per wife is on 3 L O2 via [place] at home 24 hours a day -much improved over last 24hrs - titrate O2 as needed  Bibasilar CAP -abx tx continues - plan to complete 10 days of tx   Acute bronchospastic exacerbation of COPD  -DuoNeb QID -Solu-Medrol discontinued as pt no longer wheezing   Acute on chronic systolic CHF w/ pulmonary edema  TTE July 2016 w/ EF 35-40% w/ focal WMAs and PA pressure 49mm Hg - baseline wgt ~90kg th/o this year - presently 86.2kg - I/O net negative ~3100cc since admit - continue gentle diuresis as able - BP stable presently   Newly diagnosed Afib w/ RVR New onset of A. fib appreciated 9/29 - calcium channel blocker poor choice given significantly diminished systolic function - transitioned to beta blocker - initiated amiodarone drip 10/2 due to ongoing poor control - increase BB further today - no anticoagulation at this time due to thrombocytopenia   Chronic renal failure  crt 1.85 July 2016, w/ recent values as high as ~2.0 - creatinine continues to slowly improve    Hyponatremia  Corrected with diuresis and now normal   Diptheroids in blood cx Most c/w contaminant - no further w/u at this time    HTN No change in tx plan today   CAD Severe LAD and RCA disease by cath June 2016 - not an operable candidate - medical tx only option - continue aspirin and Plavix  BPH  Continue Flomax  Dyslipidemia Continue statin  Protein calorie malnutrition Heart healthy diet when alert    Code Status: NO CODE BLUE  Family Communication: spoke w/ wife at bedside  Disposition Plan: SDU  Consultants: Palliative Care   Procedures: none  Antibiotics: Levaquin 9/26 > 9/28 Imipenem 9/28 > Vanc 9/26   DVT prophylaxis: SCDs  Objective: Blood pressure 110/62, pulse 111, temperature 97.9 F (36.6 C), temperature source Oral, resp. rate 21, height  (1.803 m), weight 86.2 kg (190 lb 0.6 oz), SpO2 99 %.  Intake/Output Summary (Last 24 hours) at 08/16/15 1722 Last data filed at 08/16/15 1600  Gross per 24 hour  Intake 1350.2 ml  Output   1500 ml  Net -149.8 ml    Exam: General: alert and conversant! Lungs: improved air movement throughout all fields - no active wheeze - diminished respiratory effort Cardiovascular: irreg irreg - rate 140 - no M Abdomen: Nondistended, soft, bowel sounds positive, no rebound, no ascites, no appreciable mass Extremities: No significant cyanosis, or clubbing; trace edema bilateral lower extremities Neuro:  No facial asymmetry, moves both hands on command equally  Data Reviewed: Basic Metabolic Panel:  Recent Labs Lab 08/11/15 0220 08/12/15 0240 08/13/15 0233 08/14/15  0240 08/15/15 0225 08/16/15 0220  NA 132* 132* 138 136 137 138  K 4.5 5.1 5.0 4.6 4.2 4.0  CL 79* 77* 83* 82* 82* 83*  CO2 43* 45* 44* 47* 45* 47*  GLUCOSE 130* 139* 117* 178* 106* 91  BUN 48* 51* 53* 60* 56* 50*  CREATININE 1.89* 1.99* 2.25* 2.13* 1.84* 1.65*  CALCIUM 8.9 9.2 9.0 9.0 9.0 8.7*  MG 1.8  --   --   --   --   --     CBC:  Recent Labs Lab 08/12/15 0240 08/13/15 0233 08/14/15 0240 08/15/15 0225 08/16/15 0220  WBC 8.6 7.1 5.2 7.1 6.7  NEUTROABS  8.2* 6.7 5.0 6.0 5.7  HGB 11.7* 10.7* 10.4* 10.9* 10.8*  HCT 37.3* 33.4* 32.5* 34.6* 35.4*  MCV 106.9* 107.4* 107.3* 109.1* 108.6*  PLT 101* 79* 61* 60* 67*    Liver Function Tests:  Recent Labs Lab 08/12/15 0240 08/13/15 0233 08/14/15 0240 08/15/15 0225 08/16/15 0220  AST 14*  ALT 13* 12* 11* 9* 7*  ALKPHOS 48 36* 35* 32* 34*  BILITOT 1.3* 1.1 1.0 1.0 1.1  PROT 5.8* 4.8* 5.2* 4.9* 4.5*  ALBUMIN 3.5 3.0* 2.9* 3.0* 2.7*    Recent Results (from the past 240 hour(s))  Blood culture (routine x 2)     Status: None   Collection Time: 08/10/15 12:01 AM  Result Value Ref Range Status   Specimen Description BLOOD LEFT ANTECUBITAL  Final   Special Requests BOTTLES DRAWN AEROBIC ONLY 5CC  Final   Culture  Setup Time   Final    GRAM POSITIVE RODS AEROBIC BOTTLE ONLY CRITICAL RESULT CALLED TO, READ BACK BY AND VERIFIED WITH: Judie Petit ZOXWRUE RN [id number] 08/11/15 A BROWNING    Culture   Final    DIPHTHEROIDS(CORYNEBACTERIUM SPECIES) Standardized susceptibility testing for this organism is not available.    Report Status 08/13/2015 FINAL  Final  Blood culture (routine x 2)     Status: None   Collection Time: 08/10/15  1:40 AM  Result Value Ref Range Status   Specimen Description BLOOD RIGHT ANTECUBITAL  Final   Special Requests BOTTLES DRAWN AEROBIC AND ANAEROBIC 10CC   Final   Culture NO GROWTH 5 DAYS  Final   Report Status 08/15/2015 FINAL  Final  MRSA PCR Screening     Status: None   Collection Time: 08/10/15  4:35 AM  Result Value Ref Range Status   MRSA by PCR NEGATIVE NEGATIVE Final    Comment:        The GeneXpert MRSA Assay (FDA approved for NASAL specimens only), is one component of a comprehensive MRSA colonization surveillance program. It is not intended to diagnose MRSA infection nor to guide or monitor treatment for MRSA infections.      Studies:   Recent x-ray studies have been reviewed in detail by the Attending Physician  Scheduled  Meds:  Scheduled Meds: . aspirin  81 mg Oral Daily  . clopidogrel  75 mg Oral Daily  . dextromethorphan-guaiFENesin  1 tablet Oral BID  . feeding supplement (ENSURE ENLIVE)  237 mL Oral BID BM  . furosemide  20 mg Intravenous Q12H  . imipenem-cilastatin  250 mg Intravenous 4 times per day  . ipratropium  0.5 mg Nebulization BID  . levalbuterol  0.63 mg Nebulization BID  . metoprolol  10 mg Intravenous 4 times per day  . sodium chloride  3 mL Intravenous Q12H    Time spent on care of  this patient: 35 mins   MCCLUNG,JEFFREY T , MD   [facility] Office  [phone] Pager - Text Page per Amion as per below:  On-Call/Text Page:      [email or web address]      password [email or web address]  If 7PM-7AM, please contact night-coverage [email or web address] Password [email or web address] 08/16/2015, 5:22 PM   LOS: 6 days

## 2015-08-16 NOTE — Progress Notes (Signed)
RT removed pt from bipap and placed on 3L [place]. RT will continue to monitor.

## 2015-08-16 NOTE — Clinical Social Work Note (Signed)
Clinical Social Work Assessment  Patient Details  Name: Bill Walker MRN: [id number] Date of Birth: 09/18/1928  Date of referral:  08/16/15               Reason for consult:  Facility Placement, Discharge Planning                Permission sought to share information with:  Family Supports, Chartered certified accountant granted to share information::  Yes, Verbal Permission Granted  Name::     Public librarian::  SNFs  Relationship::     Contact Information:     Housing/Transportation Living arrangements for the past 2 months:  Single Family Home Source of Information:  Spouse Patient Interpreter Needed:  None Criminal Activity/Legal Involvement Pertinent to Current Situation/Hospitalization:  No - Comment as needed Significant Relationships:  Spouse Lives with:  Spouse Do you feel safe going back to the place where you live?  Yes Need for family participation in patient care:  Yes (Comment)  Care giving concerns:  Patient's wife is requesting patient go to a facility at discharge as she understands she cannot meet his needs at home.    Social Worker assessment / plan:  CSW met with patient and patient's wife at bedside to complete assessment. Patient is currently sleeping. Patient's wife is requesting SNF placement for the patient at discharge and would like for the patient to go to [facility] if possible. Per wife, the patient has been to this facility in the past. CSW explained SNF search/placement process and answered the wife's questions. Per the wife the patient has been through "all of this before." She states that he was sent to [facility] in the past and improved to the point where he could return home with her. The wife remains optimistic that the patient will improve and be able to go to "rehab" before returning home.   Employment status:  Retired Forensic scientist:  Medicare PT Recommendations:  [place] / Referral to  community resources:  [place]  Patient/Family's Response to care:  Patient's family appears to be happy with the care the patient has received.  Patient/Family's Understanding of and Emotional Response to Diagnosis, Current Treatment, and Prognosis:  Patient's wife states that she understands that they "have to take it day by day at this point." She feels that the patient is making some improvements. She seems to understand what the patient's post DC needs will be. She remains hopefull that the patient will improve.   Emotional Assessment Appearance:  Appears stated age Attitude/Demeanor/Rapport:  Unable to Assess (Patient asleep ) Affect (typically observed):  Unable to Assess (Patient asleep) Orientation:  Oriented to Self Alcohol / Substance use:  Tobacco Use (Hx of) Psych involvement (Current and /or in the community):  No (Comment)  Discharge Needs  Concerns to be addressed:  Discharge Planning Concerns Readmission within the last 30 days:  No Current discharge risk:  Chronically ill, Physical Impairment, Cognitively Impaired Barriers to Discharge:  Continued Medical Work up   [employer] MSW, [place], [place], [phone]

## 2015-08-16 NOTE — Care Management Important Message (Signed)
Important Message  Patient Details  Name: Bill Walker MRN: [id number] Date of Birth: 06-Apr-1928   Medicare Important Message Given:  Yes-third notification given    Orson Aloe 08/16/2015, 12:54 PM

## 2015-08-17 DIAGNOSIS — I272 Other secondary pulmonary hypertension: Secondary | ICD-10-CM

## 2015-08-17 DIAGNOSIS — J441 Chronic obstructive pulmonary disease with (acute) exacerbation: Secondary | ICD-10-CM | POA: Diagnosis present

## 2015-08-17 LAB — CBC WITH DIFFERENTIAL/PLATELET
BASOS ABS: 0 10*3/uL (ref 0.0–0.1)
BASOS PCT: 0 %
EOS PCT: 0 %
Eosinophils Absolute: 0 10*3/uL (ref 0.0–0.7)
HCT: 37.2 % — ABNORMAL LOW (ref 39.0–52.0)
Hemoglobin: 11.9 g/dL — ABNORMAL LOW (ref 13.0–17.0)
Lymphocytes Relative: 2 %
Lymphs Abs: 0.2 10*3/uL — ABNORMAL LOW (ref 0.7–4.0)
MCH: 34.4 pg — ABNORMAL HIGH (ref 26.0–34.0)
MCHC: 32 g/dL (ref 30.0–36.0)
MCV: 107.5 fL — ABNORMAL HIGH (ref 78.0–100.0)
MONO ABS: 1 10*3/uL (ref 0.1–1.0)
Monocytes Relative: 8 %
NEUTROS ABS: 10.9 10*3/uL — AB (ref 1.7–7.7)
Neutrophils Relative %: 90 %
PLATELETS: 63 10*3/uL — AB (ref 150–400)
RBC: 3.46 MIL/uL — ABNORMAL LOW (ref 4.22–5.81)
RDW: 14 % (ref 11.5–15.5)
WBC: 12.1 10*3/uL — ABNORMAL HIGH (ref 4.0–10.5)

## 2015-08-17 LAB — BASIC METABOLIC PANEL
ANION GAP: 8 (ref 5–15)
BUN: 46 mg/dL — ABNORMAL HIGH (ref 6–20)
CALCIUM: 8.4 mg/dL — AB (ref 8.9–10.3)
CO2: 45 mmol/L — ABNORMAL HIGH (ref 22–32)
Chloride: 86 mmol/L — ABNORMAL LOW (ref 101–111)
Creatinine, Ser: 1.48 mg/dL — ABNORMAL HIGH (ref 0.61–1.24)
GFR, EST AFRICAN AMERICAN: 48 mL/min — AB (ref >60)
GFR, EST NON AFRICAN AMERICAN: 41 mL/min — AB (ref >60)
Glucose, Bld: 101 mg/dL — ABNORMAL HIGH (ref 65–99)
Potassium: 4 mmol/L (ref 3.5–5.1)
Sodium: 139 mmol/L (ref 135–145)

## 2015-08-17 NOTE — Clinical Social Work Note (Signed)
BSW intern spoke with patient's wife regarding available bed offers. Patient wife with preference to [facility]. [name] has extended a bed offer and patient wife accepted. BSW intern left message with facility admission coordinator to accept available bed offer - await confirmation and will update family. BSW intern/ CSW remains available for support and will facilitate patient discharge needs once medically stable.  Jenita Seashore BSW Intern, [phone]

## 2015-08-17 NOTE — Progress Notes (Signed)
OT Cancellation Note  Patient Details Name: Bill Walker MRN: [id number] DOB: 1928-06-17   Cancelled Treatment:    Reason Eval/Treat Not Completed: Fatigue/lethargy limiting ability to participate - Pt sleeping soundly.   Angelene Giovanni [place], OTR/L [phone]  08/17/2015, 3:05 PM

## 2015-08-17 NOTE — Progress Notes (Signed)
Pt refused, RT informed pt to call for RT if changes his mind during the night. Pt on 3L nasal cannula and tolerating well at this time. All other vital signs are within normal range

## 2015-08-17 NOTE — Progress Notes (Signed)
OT Cancellation Note  Patient Details Name: Michaiah Holsopple MRN: [id number] DOB: 11-Nov-1928   Cancelled Treatment:    Reason Eval/Treat Not Completed: Fatigue/lethargy limiting ability to participate - will try back this pm as schedule allows.  Angelene Giovanni [place], OTR/L [phone]  08/17/2015, 12:14 PM

## 2015-08-17 NOTE — Clinical Documentation Improvement (Signed)
Hospitalist  Possible Clinical Conditions: - Acute Encephalopathy, including type - Other condition - Unable to clinically determine  Clinical Information: "Altered mental status" is documented in the progress notes. "The patient has taken a turn for the worse today. Though he was very much alert and interactive yesterday today he is quite lethargic. His eyes are open but he does not follow the examiner. He will follow very simple commands such as lift your hand off the bed. His respiratory effort appears to be quite diminished." is documented in the progress note dated 08/15/15 by Dr. Sharon Seller.  (please document your response in the progress notes and discharge summary and not on the query form itself.)   Please exercise your independent, professional judgment when responding. A specific answer is not anticipated or expected.   Thank You, Jerral Ralph  RN BSN CCDS [phone] Health Information Management [place]

## 2015-08-17 NOTE — Progress Notes (Signed)
[place] TEAM 1 - Stepdown/ICU TEAM Progress Note  Larson Limones ZOX:[id number] DOB: Sep 06, 1928 DOA: 08/09/2015 PCP: Roxy Manns, MD  Admit HPI / Brief Narrative: Coran Dipaola is a 81 y.o.WM PMHx Cronic Rspiratory Filure on 3 L O2 at home,COPD, , CAD native artery, Essential HTN, Cronic Sstolic CHF, dyslipidemia, CKD.  The patient was brought in by wife as he has been progressively declining over last few months. The patient has loss of appetite along with loss of weight. Wife mentions patient has lost 60 pounds of weight. Patient denies any nausea vomiting or constipation or abdominal pain. No also diarrhea. Patient has been having chronic cough. No fever or chills. Patient has been more tired and lethargic. No chest pain or no fall recently. But since his recent admission in June patient has 3 falls are clear. Patient is hard of hearing therefore unable to provide any significant history. The history was taken with the help of patient's wife who is a primary caregiver for the patient. No recent change in his medications reported. No recent travel and no sick contacts.  The patient is coming from home  At his baseline ambulates with walker And is dependent for most of his ADL; does not manages his medication on his own.  HPI/Subjective: 10/4  A/O  0 (patient sleeping soundly after being in chair and walking from bed to chair),  Assessment/Plan: Acute on chronic respiratory failure/CAP -Per wife is on 3 L O2 via [place] at home 24 hours a day -Currently on 2 L O2 via  maintain SPO2 98%  -Continue Primaxin  COPD (chronic obstructive pulmonary disease) -Atrovent -Flutter valve  Primary metabolic alkalosis with concomitant respiratory acidosis (ABG with patient off BiPAP) -This is suggestive of possible COPD flareup along with ongoing pneumonia based on the chest x-ray and bacteremia. -See COPD -Blood cultures positive Diphtheroids (Corynebacterium).  Acute on chronic  systolic CHF (congestive heart failure) -Strict I&O since admission;- 2.8 L -Lasix 20 mg BID -9/29 DC Imdur 60 mg daily secondary to deteriorating cognition -Metoprolol IV 15 mg QID secondary to deteriorating cognition   Pulmonary hypertension -See CHF  Pulmonary edema -See CHF  Essential hypertension -See CHF  CAD native artery -With the symptoms ongoing for more than 2 months with gradual decline, his current presentation Does not appear to be any cardiac event at present. Continue aspirin and Plavix.  BPH (benign prostatic hyperplasia) Continue Flomax.   Dyslipidemia Continue statin.  Protein calorie malnutrition -Heart healthy diet when off BiPAP  Plan of care -After long talk with Mrs. Heslin concerning the side effects of intubation/CPR she requested that we make patient DO NOT RESUSCITATE, but continue the current level of care.. -Wife stated had meeting today with palliative care but did not make any decision, however did agree to DNR with me -Per wife [name] SN[facility] plan.    Code Status: FULL Family Communication: Wife present at time of exam Disposition Plan: [name]     Consultants: Dr.Gene Neale Burly (palliative care)   Procedure/Significant Events: 7/5 echocardiogram;- Left ventricle: mild focalbasal hypertrophy of the septum.-LVEF=35%- 40%. akinesis of the apicalanteroseptal, anterolateral, and apical myocardium. - Left atrium: moderately dilated.- Pulmonary arteries: PA peak pressure: 49 mm Hg (S).   Culture 9/27 blood left AC/positive Diphtheroids (Corynebacterium) 9/27 blood right AC NEGATIVE 9/27 MRSA by PCR negative   Antibiotics: Levaquin 9/27 >>stopped 9/28 Vanc 9/27 >> stopped 9/30 Primaxin 9/29>>   DVT prophylaxis: subcutaneous Heparin    Devices  LINES / TUBES:      Continuous Infusions: . amiodarone 30 mg/hr (08/17/15 1011)    Objective: VITAL SIGNS: Temp: 97.5 F (36.4  C) (10/04 1955) Temp Source: Oral (10/04 1955) BP: 105/74 mmHg (10/04 2000) Pulse Rate: 33 (10/04 2000) SPO2; FIO2:   Intake/Output Summary (Last 24 hours) at 08/17/15 2120 Last data filed at 08/17/15 1500  Gross per 24 hour  Intake  780.6 ml  Output    825 ml  Net  -44.4 ml     Exam: General:  A/O  0 (patient sleeping soundly after being in chair and walking from bed to chair), NAD,,  chronic respiratory failure, currently on [place]  Eyes: Negative headache, eye pain, double vision,negative scleral hemorrhage ENT: Negative Runny nose, negative ear pain, negative gingival bleeding, Neck:  Negative scars, masses, torticollis, lymphadenopathy, JVD Lungs: diffuse decreased breath sounds, negative wheezes or crackles Cardiovascular: Irregular irregular rhythm and rate without murmur gallop or rub normal S1 and S2 Abdomen:negative abdominal pain, nondistended, positive soft, bowel sounds, no rebound, no ascites, no appreciable mass Extremities: No significant cyanosis, clubbing, positive bilateral pedal edema to hips 1-2+ Psychiatric:  Negative depression, negative anxiety, negative fatigue, negative mania  Neurologic:  Cranial nerves II through XII intact, tongue/uvula midline, all extremities muscle strength 5/5, sensation intact throughout, negative dysarthria, negative expressive aphasia, negative receptive aphasia.   Data Reviewed: Basic Metabolic Panel:  Recent Labs Lab 08/11/15 0220  08/13/15 0233 08/14/15 0240 08/15/15 0225 08/16/15 0220 08/17/15 0301  NA 132*  < > 138 136 137 138 139  K 4.5  < > 5.0 4.6 4.2 4.0 4.0  CL 79*  < > 83* 82* 82* 83* 86*  CO2 43*  < > 44* 47* 45* 47* 45*  GLUCOSE 130*  < > 117* 178* 106* 91 101*  BUN 48*  < > 53* 60* 56* 50* 46*  CREATININE 1.89*  < > 2.25* 2.13* 1.84* 1.65* 1.48*  CALCIUM 8.9  < > 9.0 9.0 9.0 8.7* 8.4*  MG 1.8  --   --   --   --   --   --   < > = values in this interval not displayed. Liver Function Tests:  Recent  Labs Lab 08/12/15 0240 08/13/15 0233 08/14/15 0240 08/15/15 0225 08/16/15 0220  AST 25 21 17 15  14*  ALT 13* 12* 11* 9* 7*  ALKPHOS 48 36* 35* 32* 34*  BILITOT 1.3* 1.1 1.0 1.0 1.1  PROT 5.8* 4.8* 5.2* 4.9* 4.5*  ALBUMIN 3.5 3.0* 2.9* 3.0* 2.7*   No results for input(s): LIPASE, AMYLASE in the last 168 hours. No results for input(s): AMMONIA in the last 168 hours. CBC:  Recent Labs Lab 08/13/15 0233 08/14/15 0240 08/15/15 0225 08/16/15 0220 08/17/15 0301  WBC 7.1 5.2 7.1 6.7 12.1*  NEUTROABS 6.7 5.0 6.0 5.7 10.9*  HGB 10.7* 10.4* 10.9* 10.8* 11.9*  HCT 33.4* 32.5* 34.6* 35.4* 37.2*  MCV 107.4* 107.3* 109.1* 108.6* 107.5*  PLT 79* 61* 60* 67* 63*   Cardiac Enzymes: No results for input(s): CKTOTAL, CKMB, CKMBINDEX, TROPONINI in the last 168 hours. BNP (last 3 results)  Recent Labs  05/03/15 1450 08/10/15 0001  BNP 379.7* 891.3*    ProBNP (last 3 results)  Recent Labs  07/20/15 1724  PROBNP 856.0*    CBG: No results for input(s): GLUCAP in the last 168 hours.  Recent Results (from the past 240 hour(s))  Blood culture (routine x 2)     Status: None  Collection Time: 08/10/15 12:01 AM  Result Value Ref Range Status   Specimen Description BLOOD LEFT ANTECUBITAL  Final   Special Requests BOTTLES DRAWN AEROBIC ONLY 5CC  Final   Culture  Setup Time   Final    GRAM POSITIVE RODS AEROBIC BOTTLE ONLY CRITICAL RESULT CALLED TO, READ BACK BY AND VERIFIED WITH: Judie Petit BJYNWGN RN [phone] 08/11/15 A BROWNING    Culture   Final    DIPHTHEROIDS(CORYNEBACTERIUM SPECIES) Standardized susceptibility testing for this organism is not available.    Report Status 08/13/2015 FINAL  Final  Blood culture (routine x 2)     Status: None   Collection Time: 08/10/15  1:40 AM  Result Value Ref Range Status   Specimen Description BLOOD RIGHT ANTECUBITAL  Final   Special Requests BOTTLES DRAWN AEROBIC AND ANAEROBIC 10CC   Final   Culture NO GROWTH 5 DAYS  Final   Report Status  08/15/2015 FINAL  Final  MRSA PCR Screening     Status: None   Collection Time: 08/10/15  4:35 AM  Result Value Ref Range Status   MRSA by PCR NEGATIVE NEGATIVE Final    Comment:        The GeneXpert MRSA Assay (FDA approved for NASAL specimens only), is one component of a comprehensive MRSA colonization surveillance program. It is not intended to diagnose MRSA infection nor to guide or monitor treatment for MRSA infections.      Studies:  Recent x-ray studies have been reviewed in detail by the Attending Physician  Scheduled Meds:  Scheduled Meds: . aspirin  81 mg Oral Daily  . clopidogrel  75 mg Oral Daily  . feeding supplement (ENSURE ENLIVE)  237 mL Oral BID BM  . furosemide  20 mg Intravenous Q12H  . imipenem-cilastatin  250 mg Intravenous 4 times per day  . ipratropium  0.5 mg Nebulization BID  . levalbuterol  0.63 mg Nebulization BID  . metoprolol  15 mg Intravenous 4 times per day  . sodium chloride  3 mL Intravenous Q12H    Time spent on care of this patient: 40 mins   Elena Davia, Roselind Messier , MD  [facility] Office  [phone] Pager [phone]  On-Call/Text Page:      [email or web address]      password [email or web address]  If 7PM-7AM, please contact night-coverage [email or web address] Password [email or web address] 08/17/2015, 9:20 PM   LOS: 7 days   Care during the described time interval was provided by me .  I have reviewed this patient's available data, including medical history, events of note, physical examination, and all test results as part of my evaluation. I have personally reviewed and interpreted all radiology studies.   Carolyne Littles, MD [phone] Pager

## 2015-08-17 NOTE — Progress Notes (Signed)
Physical Therapy Treatment Patient Details Name: Bill Walker MRN: [id number] DOB: 10-20-1928 Today's Date: 08/17/2015    History of Present Illness Bill Walker is a 81 y.o. male with Past medical history of COPD chronic respiratory failure on chronic oxygen, coronary artery disease, essential hypertension, chronic systolic CHF, dyslipidemia, chronic kidney disease. Admitted with SOB, progressive weakness, and loss of weight . Dx with acute on chronic CHF and respiratory failure    PT Comments    Pt able to progress gait today on 6L with sats 97%, HR varied with irregular waveform 114-135 with gait, asymptomatic with fatigue end of gait. Pt incontinent of urine and stool on arrival with no awareness. He used BSC x 2 during session for BM with total assist for pericare. Pt encouraged to continue  Mobility with nursing and will continue to follow.   Follow Up Recommendations  SNF;Supervision/Assistance - 24 hour     Equipment Recommendations       Recommendations for Other Services       Precautions / Restrictions Precautions Precautions: Fall Precaution Comments: incontinent,watch sats    Mobility  Bed Mobility Overal bed mobility: Needs Assistance Bed Mobility: Supine to Sit     Supine to sit: Min assist     General bed mobility comments: min hand held assist to elevate trunk fully with cues to scoot to EOB  Transfers Overall transfer level: Needs assistance   Transfers: Sit to/from Stand Sit to Stand: Min assist         General transfer comment: cues for hand placement and safety from bed and BSC x 3  Ambulation/Gait Ambulation/Gait assistance: Min assist;+2 safety/equipment Ambulation Distance (Feet): 150 Feet Assistive device: Rolling walker (2 wheeled) Gait Pattern/deviations: Step-through pattern;Decreased stride length;Trunk flexed   Gait velocity interpretation: Below normal speed for age/gender General Gait Details: cues for posture, position in RW  and safety with chair to follow  pt with tendency to push RW too anterior to  self, decreased safety awareness and assist to direct and steer RW   Stairs            Wheelchair Mobility    Modified Rankin (Stroke Patients Only)       Balance Overall balance assessment: Needs assistance   Sitting balance-Leahy Scale: Fair       Standing balance-Leahy Scale: Poor                      Cognition Arousal/Alertness: Awake/alert Behavior During Therapy: Flat affect Overall Cognitive Status: Impaired/Different from baseline Area of Impairment: Safety/judgement         Safety/Judgement: Decreased awareness of safety          Exercises      General Comments        Pertinent Vitals/Pain Pain Assessment: No/denies pain    Home Living                      Prior Function            PT Goals (current goals can now be found in the care plan section) Progress towards PT goals: Progressing toward goals    Frequency  Min 3X/week    PT Plan      Co-evaluation             End of Session Equipment Utilized During Treatment: Gait belt;Oxygen Activity Tolerance: Patient tolerated treatment well Patient left: in chair;with call bell/phone within reach;with chair alarm set;with family/visitor present  Time: 4098-1191 PT Time Calculation (min) (ACUTE ONLY): 32 min  Charges:  $Gait Training: 8-22 mins $Therapeutic Activity: 8-22 mins                    G Codes:      Delorse Lek 09-05-2015, 9:02 AM Delaney Meigs, PT [phone]

## 2015-08-18 LAB — CBC WITH DIFFERENTIAL/PLATELET
Basophils Absolute: 0 10*3/uL (ref 0.0–0.1)
Basophils Relative: 0 %
EOS ABS: 0 10*3/uL (ref 0.0–0.7)
EOS PCT: 0 %
HCT: 37.8 % — ABNORMAL LOW (ref 39.0–52.0)
Hemoglobin: 11.8 g/dL — ABNORMAL LOW (ref 13.0–17.0)
LYMPHS ABS: 0.4 10*3/uL — AB (ref 0.7–4.0)
Lymphocytes Relative: 3 %
MCH: 34.1 pg — AB (ref 26.0–34.0)
MCHC: 31.2 g/dL (ref 30.0–36.0)
MCV: 109.2 fL — ABNORMAL HIGH (ref 78.0–100.0)
Monocytes Absolute: 1.3 10*3/uL — ABNORMAL HIGH (ref 0.1–1.0)
Monocytes Relative: 10 %
Neutro Abs: 11.2 10*3/uL — ABNORMAL HIGH (ref 1.7–7.7)
Neutrophils Relative %: 87 %
PLATELETS: 64 10*3/uL — AB (ref 150–400)
RBC: 3.46 MIL/uL — AB (ref 4.22–5.81)
RDW: 14.1 % (ref 11.5–15.5)
WBC: 13 10*3/uL — AB (ref 4.0–10.5)

## 2015-08-18 MED ORDER — AMIODARONE HCL 200 MG PO TABS
400.0000 mg | ORAL_TABLET | Freq: Two times a day (BID) | ORAL | Status: DC
  Administered 2015-08-18 – 2015-08-24 (×14): 400 mg via ORAL
  Filled 2015-08-18 (×15): qty 2

## 2015-08-18 MED ORDER — SODIUM CHLORIDE 0.9 % IV SOLN
INTRAVENOUS | Status: DC
  Administered 2015-08-19: 06:00:00 via INTRAVENOUS

## 2015-08-18 MED ORDER — METOPROLOL TARTRATE 1 MG/ML IV SOLN
10.0000 mg | Freq: Four times a day (QID) | INTRAVENOUS | Status: DC
  Administered 2015-08-18 – 2015-08-19 (×6): 10 mg via INTRAVENOUS
  Filled 2015-08-18 (×6): qty 10

## 2015-08-18 NOTE — Progress Notes (Signed)
[facility] TEAM 1 - Stepdown/ICU TEAM PROGRESS NOTE  Rakwon Letourneau ZOX:[id number] DOB: April 19, 1928 DOA: 08/09/2015 PCP: Roxy Manns, MD  Admit HPI / Brief Narrative: 81 y.o.M w/ hx of chronic hypoxic respiratory failure due to COPD on 3L O2 home, CAD, HTN, Chronic Systolic CHF, dyslipidemia, and CKD who was brought in by his wife stating he had been progressively declining over a few months w/ loss of appetite and a 60 pound weight loss.  He did not complain of specific localizing symptoms.    HPI/Subjective: Pt looks much better presently.  He is alert and conversant and in no apparent distress.  He has converted to NSR on an amio gtt.    Assessment/Plan:  Acute on chronic hypoxic and hypercarbic respiratory failure - multifactorial  -Per wife is on 3 L O2 via [place] at home 24 hours a day -much improved over last 24hrs - titrate O2 downward as able   Bibasilar CAP -abx tx continues - complete 10 days of tx (through 10/6)  Acute bronchospastic exacerbation of COPD  -DuoNeb QID -Solu-Medrol discontinued as pt no longer wheezing - exam stable   Acute on chronic systolic CHF w/ pulmonary edema  TTE July 2016 w/ EF 35-40% w/ focal WMAs and PA pressure 49mm Hg - baseline wgt ~90kg th/o this year - presently 86.7kg - I/O net negative ~2.5L since admit - continue gentle diuresis w/o change today   Newly diagnosed Afib w/ RVR >  NSR New onset of A. fib appreciated 9/29 - calcium channel blocker poor choice given significantly diminished systolic function - transitioned to beta blocker - initiated amiodarone drip 10/2 due to ongoing poor control - transition to oral amio w/ pt now in NSR, and consider stopping amio if NSR persists - no anticoagulation at this time due to thrombocytopenia   Chronic renal failure  crt 1.85 July 2016, w/ recent values as high as ~2.0 - creatinine continues to slowly improve    Hyponatremia  Corrected with diuresis and now normal   Diptheroids in blood cx Most  c/w contaminant - no further w/u at this time   HTN No change in tx plan today - well controlled   CAD Severe LAD and RCA disease by cath June 2016 - not an operable candidate - medical tx only option - continue aspirin and Plavix  BPH  Continue Flomax  Dyslipidemia Continue statin  Protein calorie malnutrition Heart healthy diet when alert    Code Status: NO CODE BLUE  Family Communication: spoke w/ wife at bedside  Disposition Plan: SDU - if HR remains stable off IV amio today, plan for transfer to tele bed in AM - for eventual d/c to SNF for rehab stay   Consultants: Palliative Care   Procedures: none  Antibiotics: Levaquin 9/26 > 9/28 Imipenem 9/28 > Vanc 9/26   DVT prophylaxis: SCDs  Objective: Blood pressure 111/67, pulse 84, temperature 97.8 F (36.6 C), temperature source Oral, resp. rate 23, height 5\' 11"  (1.803 m), weight 86.7 kg (191 lb 2.2 oz), SpO2 100 %.  Intake/Output Summary (Last 24 hours) at 08/18/15 1118 Last data filed at 08/18/15 0800  Gross per 24 hour  Intake  960.7 ml  Output    450 ml  Net  510.7 ml    Exam: General: alert and conversant - no resp distress  Lungs: good air movement throughout all fields - no active wheeze Cardiovascular: RRR w/o M - Abdomen: Nondistended, soft, bowel sounds positive, no rebound, no ascites, no  appreciable mass Extremities: No significant cyanosis, or clubbing; trace edema bilateral lower extremities  Data Reviewed: Basic Metabolic Panel:  Recent Labs Lab 08/13/15 0233 08/14/15 0240 08/15/15 0225 08/16/15 0220 08/17/15 0301  NA 138 136 137 138 139  K 5.0 4.6 4.2 4.0 4.0  CL 83* 82* 82* 83* 86*  CO2 44* 47* 45* 47* 45*  GLUCOSE 117* 178* 106* 91 101*  BUN 53* 60* 56* 50* 46*  CREATININE 2.25* 2.13* 1.84* 1.65* 1.48*  CALCIUM 9.0 9.0 9.0 8.7* 8.4*    CBC:  Recent Labs Lab 08/14/15 0240 08/15/15 0225 08/16/15 0220 08/17/15 0301 08/18/15 0305  WBC 5.2 7.1 6.7 12.1* 13.0*    NEUTROABS 5.0 6.0 5.7 10.9* 11.2*  HGB 10.4* 10.9* 10.8* 11.9* 11.8*  HCT 32.5* 34.6* 35.4* 37.2* 37.8*  MCV 107.3* 109.1* 108.6* 107.5* 109.2*  PLT 61* 60* 67* 63* 64*    Liver Function Tests:  Recent Labs Lab 08/12/15 0240 08/13/15 0233 08/14/15 0240 08/15/15 0225 08/16/15 0220  AST 14*  ALT 13* 12* 11* 9* 7*  ALKPHOS 48 36* 35* 32* 34*  BILITOT 1.3* 1.1 1.0 1.0 1.1  PROT 5.8* 4.8* 5.2* 4.9* 4.5*  ALBUMIN 3.5 3.0* 2.9* 3.0* 2.7*    Recent Results (from the past 240 hour(s))  Blood culture (routine x 2)     Status: None   Collection Time: 08/10/15 12:01 AM  Result Value Ref Range Status   Specimen Description BLOOD LEFT ANTECUBITAL  Final   Special Requests BOTTLES DRAWN AEROBIC ONLY 5CC  Final   Culture  Setup Time   Final    GRAM POSITIVE RODS AEROBIC BOTTLE ONLY CRITICAL RESULT CALLED TO, READ BACK BY AND VERIFIED WITH: Judie Petit ZOXWRUE RN 2315 08/11/15 A BROWNING    Culture   Final    DIPHTHEROIDS(CORYNEBACTERIUM SPECIES) Standardized susceptibility testing for this organism is not available.    Report Status 08/13/2015 FINAL  Final  Blood culture (routine x 2)     Status: None   Collection Time: 08/10/15  1:40 AM  Result Value Ref Range Status   Specimen Description BLOOD RIGHT ANTECUBITAL  Final   Special Requests BOTTLES DRAWN AEROBIC AND ANAEROBIC 10CC   Final   Culture NO GROWTH 5 DAYS  Final   Report Status 08/15/2015 FINAL  Final  MRSA PCR Screening     Status: None   Collection Time: 08/10/15  4:35 AM  Result Value Ref Range Status   MRSA by PCR NEGATIVE NEGATIVE Final    Comment:        The GeneXpert MRSA Assay (FDA approved for NASAL specimens only), is one component of a comprehensive MRSA colonization surveillance program. It is not intended to diagnose MRSA infection nor to guide or monitor treatment for MRSA infections.      Studies:   Recent x-ray studies have been reviewed in detail by the Attending Physician  Scheduled  Meds:  Scheduled Meds: . aspirin  81 mg Oral Daily  . clopidogrel  75 mg Oral Daily  . feeding supplement (ENSURE ENLIVE)  237 mL Oral BID BM  . furosemide  20 mg Intravenous Q12H  . imipenem-cilastatin  250 mg Intravenous 4 times per day  . ipratropium  0.5 mg Nebulization BID  . levalbuterol  0.63 mg Nebulization BID  . metoprolol  15 mg Intravenous 4 times per day  . sodium chloride  3 mL Intravenous Q12H    Time spent on care of this patient: 25 mins  Lonia Blood , MD   [facility] Office  [phone] Pager - Text Page per Amion as per below:  On-Call/Text Page:      [email or web address]      password [email or web address]  If 7PM-7AM, please contact night-coverage [email or web address] Password [email or web address] 08/18/2015, 11:18 AM   LOS: 8 days

## 2015-08-18 NOTE — Progress Notes (Signed)
Received report from Gabe Santanella, RN at 2330. Patient care assumed at this time. 

## 2015-08-18 NOTE — Progress Notes (Signed)
RT found patient on venturi mask sat 100%..  While giving neb treatments, RT placed pt back on nasal cannula at 3 Lpm with humidity sat 99%.

## 2015-08-19 LAB — BASIC METABOLIC PANEL
ANION GAP: 7 (ref 5–15)
BUN: 43 mg/dL — AB (ref 6–20)
CALCIUM: 7.9 mg/dL — AB (ref 8.9–10.3)
CO2: 46 mmol/L — ABNORMAL HIGH (ref 22–32)
Chloride: 83 mmol/L — ABNORMAL LOW (ref 101–111)
Creatinine, Ser: 1.54 mg/dL — ABNORMAL HIGH (ref 0.61–1.24)
GFR calc Af Amer: 46 mL/min — ABNORMAL LOW (ref >60)
GFR, EST NON AFRICAN AMERICAN: 39 mL/min — AB (ref >60)
Glucose, Bld: 128 mg/dL — ABNORMAL HIGH (ref 65–99)
POTASSIUM: 3.7 mmol/L (ref 3.5–5.1)
SODIUM: 136 mmol/L (ref 135–145)

## 2015-08-19 LAB — CBC
HCT: 36 % — ABNORMAL LOW (ref 39.0–52.0)
Hemoglobin: 11 g/dL — ABNORMAL LOW (ref 13.0–17.0)
MCH: 33.2 pg (ref 26.0–34.0)
MCHC: 30.6 g/dL (ref 30.0–36.0)
MCV: 108.8 fL — ABNORMAL HIGH (ref 78.0–100.0)
PLATELETS: 65 10*3/uL — AB (ref 150–400)
RBC: 3.31 MIL/uL — AB (ref 4.22–5.81)
RDW: 13.7 % (ref 11.5–15.5)
WBC: 12.3 10*3/uL — AB (ref 4.0–10.5)

## 2015-08-19 MED ORDER — ARFORMOTEROL TARTRATE 15 MCG/2ML IN NEBU
15.0000 ug | INHALATION_SOLUTION | Freq: Two times a day (BID) | RESPIRATORY_TRACT | Status: DC
  Administered 2015-08-20 – 2015-08-26 (×12): 15 ug via RESPIRATORY_TRACT
  Filled 2015-08-19 (×17): qty 2

## 2015-08-19 MED ORDER — BUDESONIDE 0.25 MG/2ML IN SUSP
0.2500 mg | Freq: Two times a day (BID) | RESPIRATORY_TRACT | Status: DC
  Administered 2015-08-19 – 2015-08-26 (×14): 0.25 mg via RESPIRATORY_TRACT
  Filled 2015-08-19 (×14): qty 2

## 2015-08-19 MED ORDER — METOPROLOL TARTRATE 25 MG PO TABS
25.0000 mg | ORAL_TABLET | Freq: Two times a day (BID) | ORAL | Status: DC
  Administered 2015-08-20 – 2015-08-21 (×4): 25 mg via ORAL
  Filled 2015-08-19 (×4): qty 1

## 2015-08-19 MED ORDER — SIMVASTATIN 20 MG PO TABS
20.0000 mg | ORAL_TABLET | Freq: Every day | ORAL | Status: DC
  Administered 2015-08-20 – 2015-08-24 (×6): 20 mg via ORAL
  Filled 2015-08-19 (×6): qty 1

## 2015-08-19 MED ORDER — MEGESTROL ACETATE 400 MG/10ML PO SUSP
800.0000 mg | Freq: Every day | ORAL | Status: DC
  Administered 2015-08-19 – 2015-08-24 (×6): 800 mg via ORAL
  Filled 2015-08-19 (×7): qty 20

## 2015-08-19 MED ORDER — LEVALBUTEROL HCL 0.63 MG/3ML IN NEBU
0.6300 mg | INHALATION_SOLUTION | Freq: Three times a day (TID) | RESPIRATORY_TRACT | Status: DC | PRN
  Administered 2015-08-20 – 2015-08-23 (×2): 0.63 mg via RESPIRATORY_TRACT
  Filled 2015-08-19 (×3): qty 3

## 2015-08-19 NOTE — Progress Notes (Signed)
Occupational Therapy Treatment Patient Details Name: Bill Walker MRN: [id number] DOB: 1928-09-16 Today's Date: 08/19/2015    History of present illness Bill Walker is a 81 y.o. male with Past medical history of COPD chronic respiratory failure on chronic oxygen, coronary artery disease, essential hypertension, chronic systolic CHF, dyslipidemia, chronic kidney disease. Admitted with SOB, progressive weakness, and loss of weight . Dx with acute on chronic CHF and respiratory failure   OT comments  Pt fatigued this pm, but able to participate in EOB exercise and standing activities.  HR 123-169.  02 sats decreased to 79% on 3L, but recovered on 5L to 100% with pursed lip breathing.   Follow Up Recommendations  SNF    Equipment Recommendations       Recommendations for Other Services      Precautions / Restrictions Precautions Precautions: Fall       Mobility Bed Mobility Overal bed mobility: Needs Assistance Bed Mobility: Supine to Sit;Sit to Supine     Supine to sit: Min assist Sit to supine: Min assist      Transfers Overall transfer level: Needs assistance Equipment used: Rolling walker (2 wheeled) Transfers: Sit to/from [employer] Sit to Stand: Min assist Stand pivot transfers: Min assist       General transfer comment: Min a for balance and to move sit to stand     Balance Overall balance assessment: Needs assistance Sitting-balance support: Feet supported Sitting balance-Leahy Scale: Fair     Standing balance support: Bilateral upper extremity supported Standing balance-Leahy Scale: Poor                     ADL                           Toilet Transfer: Minimal assistance;Ambulation;RW   Toileting- Clothing Manipulation and Hygiene: Moderate assistance;Sit to/from stand                Vision                     Perception     Praxis      Cognition   Behavior During Therapy: [facility] for  tasks assessed/performed Overall Cognitive Status: Impaired/Different from baseline Area of Impairment: Orientation;Attention;Safety/judgement;Problem solving Orientation Level: Disoriented to;Place;Time Current Attention Level: Sustained      Safety/Judgement: Decreased awareness of safety     General Comments: difficult to accurately assess due to severely [facility]    Extremity/Trunk Assessment               Exercises Other Exercises Other Exercises: Pt performed 5 reps A/AAROM shoulder flexion    Shoulder Instructions       General Comments      Pertinent Vitals/ Pain       Pain Assessment: Faces Faces Pain Scale: No hurt  Home Living                                          Prior Functioning/Environment              Frequency Min 2X/week     Progress Toward Goals  OT Goals(current goals can now be found in the care plan section)     ADL Goals Pt Will Perform Grooming: with set-up;with supervision;sitting Pt Will Perform Upper Body Bathing: with set-up;with supervision;sitting Pt Will  Perform Lower Body Bathing: with min assist Pt Will Perform Upper Body Dressing: with set-up;with supervision;sitting Pt Will Perform Lower Body Dressing: with min assist Pt Will Transfer to Toilet: with min guard assist;ambulating Pt Will Perform Toileting - Clothing Manipulation and hygiene: with min guard assist;sit to/from stand Additional ADL Goal #1: Pt will be S in and OOB for BADLs  Plan Discharge plan remains appropriate    Co-evaluation                 End of Session Equipment Utilized During Treatment: Rolling walker;Oxygen   Activity Tolerance Patient limited by fatigue   Patient Left in bed;with call bell/phone within reach;with nursing/sitter in room;with family/visitor present   Nurse Communication Mobility status        Time: 8119-1478 OT Time Calculation (min): 26 min  Charges: OT General Charges $OT Visit: 1  Procedure OT Treatments $Therapeutic Activity: 23-37 mins  Avnoor Koury M 08/19/2015, 4:07 PM

## 2015-08-19 NOTE — Care Management Important Message (Signed)
Important Message  Patient Details  Name: Bill Walker MRN: [id number] Date of Birth: 1928/05/24   Medicare Important Message Given:  Yes-fourth notification given    Orson Aloe 08/19/2015, 12:03 PM

## 2015-08-19 NOTE — Progress Notes (Signed)
Physical Therapy Treatment Patient Details Name: Bill Walker MRN: [id number] DOB: 06-Oct-1928 Today's Date: 08/19/2015    History of Present Illness Bill Walker is a 81 y.o. male with Past medical history of COPD chronic respiratory failure on chronic oxygen, coronary artery disease, essential hypertension, chronic systolic CHF, dyslipidemia, chronic kidney disease. Admitted with SOB, progressive weakness, and loss of weight . Dx with acute on chronic CHF and respiratory failure    PT Comments    Pt progressing with gait today without need for 2nd person for safety, slower controlled gait but continues to need assist to steer with max VC for posture and safety. Pt educated for HEP with encouragement to continue to perform and distracted end of session by pain from condom catheter.   Follow Up Recommendations  SNF;Supervision/Assistance - 24 hour     Equipment Recommendations       Recommendations for Other Services       Precautions / Restrictions Precautions Precautions: Fall Precaution Comments: incontinent,watch sats Restrictions Weight Bearing Restrictions: No    Mobility  Bed Mobility Overal bed mobility: Needs Assistance Bed Mobility: Supine to Sit     Supine to sit: Min assist     General bed mobility comments: cues for sequence and use of rail with min assist to fully raise trunk from surface  Transfers Overall transfer level: Needs assistance   Transfers: Sit to/from Stand Sit to Stand: Min assist         General transfer comment: cues for hand placement and safety from bed   Ambulation/Gait Ambulation/Gait assistance: Min assist Ambulation Distance (Feet): 160 Feet Assistive device: Rolling walker (2 wheeled) Gait Pattern/deviations: Step-through pattern;Decreased stride length;Trunk flexed   Gait velocity interpretation: Below normal speed for age/gender General Gait Details: cues for posture, position in RW and safety with assist to direct RW  around obstacles. Pt maintains flexed posture with RW too far anteriorly   Stairs            Wheelchair Mobility    Modified Rankin (Stroke Patients Only)       Balance Overall balance assessment: Needs assistance   Sitting balance-Leahy Scale: Fair       Standing balance-Leahy Scale: Poor                      Cognition Arousal/Alertness: Awake/alert Behavior During Therapy: Flat affect Overall Cognitive Status: Impaired/Different from baseline Area of Impairment: Safety/judgement         Safety/Judgement: Decreased awareness of safety          Exercises General Exercises - Lower Extremity Long Arc Quad: AROM;Seated;Both;15 reps Hip Flexion/Marching: AROM;Seated;Both;15 reps    General Comments        Pertinent Vitals/Pain Pain Assessment: 0-10 Pain Score: 5  Pain Location: penis- presumed due to pulling of condom cath, RN made aware Pain Descriptors / Indicators: Burning Pain Intervention(s): Limited activity within patient's tolerance;Repositioned  HR 97-122 BP 139/78 sats 90-96% on 4-6L with gait, 94% on 4L end of session    Home Living                      Prior Function            PT Goals (current goals can now be found in the care plan section) Progress towards PT goals: Progressing toward goals    Frequency  Min 3X/week    PT Plan      Co-evaluation  End of Session Equipment Utilized During Treatment: Gait belt;Oxygen Activity Tolerance: Patient tolerated treatment well Patient left: in chair;with call bell/phone within reach;with chair alarm set;with family/visitor present     Time: 1610-9604 PT Time Calculation (min) (ACUTE ONLY): 23 min  Charges:  $Gait Training: 8-22 mins $Therapeutic Exercise: 8-22 mins                    G Codes:      Delorse Lek 09-08-15, 8:53 AM Delaney Meigs, PT [phone]

## 2015-08-19 NOTE — Clinical Social Work Note (Signed)
Clinical Social Worker continuing to follow patient and family for support and discharge planning needs.  Patient has a bed available at [facility] once medically appropriate.  CSW left message with admissions coordinator to update on patient current status.  Per CM, patient not yet medically ready for discharge.  CSW remains available for support and to facilitate patient discharge needs once medically stable.  Macario Golds, [place] [phone]

## 2015-08-19 NOTE — Progress Notes (Signed)
[name] TEAM 1 - Stepdown/ICU TEAM Progress Note  Bill Walker WUJ:[id number] DOB: 1928-10-06 DOA: 08/09/2015 PCP: Roxy Manns, MD  Admit HPI / Brief Narrative: Bill Walker is a 81 y.o.WM PMHx Cronic Rspiratory Filure on 3 L O2 at home,COPD, , CAD native artery, Essential HTN, Cronic Sstolic CHF, dyslipidemia, CKD.  The patient was brought in by wife as he has been progressively declining over last few months. The patient has loss of appetite along with loss of weight. Wife mentions patient has lost 60 pounds of weight. Patient denies any nausea vomiting or constipation or abdominal pain. No also diarrhea. Patient has been having chronic cough. No fever or chills. Patient has been more tired and lethargic. No chest pain or no fall recently. But since his recent admission in June patient has 3 falls are clear. Patient is hard of hearing therefore unable to provide any significant history. The history was taken with the help of patient's wife who is a primary caregiver for the patient. No recent change in his medications reported. No recent travel and no sick contacts.  The patient is coming from home  At his baseline ambulates with walker And is dependent for most of his ADL; does not manages his medication on his own.  HPI/Subjective: 10/6  A/O  4 participated in PT x 2 sessions today     Assessment/Plan: Acute on chronic respiratory failure/CAP -Per wife is on 3 L O2 via [place] at home 24 hours a day -Currently on 3 L O2 via [place] maintain SPO2 100%  -Last day of Primaxin (10 days of antibiotics)  COPD (chronic obstructive pulmonary disease) -Brovana BID -Budesonide BID -Xopenex PRN -Flutter valve  Diptheroids in blood cx -Blood cultures positive Diphtheroids (Corynebacterium). -Most c/w contaminant - no further w/u at this time   Acute on chronic systolic CHF (congestive heart failure) -Strict I&O since admission;- 1.4 L -Lasix 20 mg BID -Restart metoprolol 25 mg  BID    Pulmonary hypertension -See CHF -At some point in the future may want to restart Imdur  Pulmonary edema -See CHF  Essential hypertension -See CHF  CAD native artery -With the symptoms ongoing for more than 2 months with gradual decline, his current presentation Does not appear to be any cardiac event at present. Continue aspirin and Plavix.  BPH (benign prostatic hyperplasia) Continue Flomax.  Dyslipidemia -Restart Zocor 20 mg QHS  Protein calorie malnutrition/Anorexia -Regular diet Pt requires calories. Told wife to bring whatever she thinks he will eat  -Start Megace 800 mg Daily   Plan of care -After long talk with Mrs. Dennin concerning the side effects of intubation/CPR she requested that we make patient DO NOT RESUSCITATE, but continue the current level of care.. -Wife stated had meeting today with palliative care but did not make any decision, however did agree to DNR with me -Per wife [name] plan.    Code Status: FULL Family Communication: Wife present at time of exam Disposition Plan: [name]     Consultants: Dr.Gene Neale Burly (palliative care)   Procedure/Significant Events: 7/5 echocardiogram;- Left ventricle: mild focalbasal hypertrophy of the septum.-LVEF=35%- 40%. akinesis of the apicalanteroseptal, anterolateral, and apical myocardium. - Left atrium: moderately dilated.- Pulmonary arteries: PA peak pressure: 49 mm Hg (S).   Culture 9/27 blood left AC/positive Diphtheroids (Corynebacterium) 9/27 blood right AC NEGATIVE 9/27 MRSA by PCR negative   Antibiotics: Levaquin 9/27 >>stopped 9/28 Vanc 9/27 >> stopped 9/30 Primaxin 9/29>> stopped 10/6   DVT prophylaxis:  subcutaneous Heparin    Devices    LINES / TUBES:      Continuous Infusions: . sodium chloride 10 mL/hr at 08/19/15 0600    Objective: VITAL SIGNS: Temp: 98 F (36.7 C) (10/06 0744) Temp Source: Oral (10/06  0744) BP: 142/59 mmHg (10/06 0744) Pulse Rate: 81 (10/06 0744) SPO2; FIO2:   Intake/Output Summary (Last 24 hours) at 08/19/15 0746 Last data filed at 08/19/15 0400  Gross per 24 hour  Intake 1210.2 ml  Output    450 ml  Net  760.2 ml     Exam: General:  A/O  4 NAD, participated in PT x 2 sessions today  ,  chronic respiratory failure, currently on [place]  Eyes: Negative headache, eye pain, double vision,negative scleral hemorrhage ENT: Negative Runny nose, negative ear pain, negative gingival bleeding, Neck:  Negative scars, masses, torticollis, lymphadenopathy, JVD Lungs: clear to auscultation bilateral negative wheezes or crackles Cardiovascular: Irregular irregular rhythm and rate without murmur gallop or rub normal S1 and S2 Abdomen:negative abdominal pain, nondistended, positive soft, bowel sounds, no rebound, no ascites, no appreciable mass Extremities: No significant cyanosis, clubbing, positive bilateral pedal edema to hips 1-2+ Psychiatric:  Negative depression, negative anxiety, negative fatigue, negative mania  Neurologic:  Cranial nerves II through XII intact, tongue/uvula midline, all extremities muscle strength 5/5, sensation intact throughout, negative dysarthria, negative expressive aphasia, negative receptive aphasia.   Data Reviewed: Basic Metabolic Panel:  Recent Labs Lab 08/14/15 0240 08/15/15 0225 08/16/15 0220 08/17/15 0301 08/19/15 0020  NA 136 137 138 139 136  K 4.6 4.2 4.0 4.0 3.7  CL 82* 82* 83* 86* 83*  CO2 47* 45* 47* 45* 46*  GLUCOSE 178* 106* 91 101* 128*  BUN 60* 56* 50* 46* 43*  CREATININE 2.13* 1.84* 1.65* 1.48* 1.54*  CALCIUM 9.0 9.0 8.7* 8.4* 7.9*   Liver Function Tests:  Recent Labs Lab 08/13/15 0233 08/14/15 0240 08/15/15 0225 08/16/15 0220  AST 21 17 15  14*  ALT 12* 11* 9* 7*  ALKPHOS 36* 35* 32* 34*  BILITOT 1.1 1.0 1.0 1.1  PROT 4.8* 5.2* 4.9* 4.5*  ALBUMIN 3.0* 2.9* 3.0* 2.7*   No results for input(s): LIPASE,  AMYLASE in the last 168 hours. No results for input(s): AMMONIA in the last 168 hours. CBC:  Recent Labs Lab 08/14/15 0240 08/15/15 0225 08/16/15 0220 08/17/15 0301 08/18/15 0305 08/19/15 0020  WBC 5.2 7.1 6.7 12.1* 13.0* 12.3*  NEUTROABS 5.0 6.0 5.7 10.9* 11.2*  --   HGB 10.4* 10.9* 10.8* 11.9* 11.8* 11.0*  HCT 32.5* 34.6* 35.4* 37.2* 37.8* 36.0*  MCV 107.3* 109.1* 108.6* 107.5* 109.2* 108.8*  PLT 61* 60* 67* 63* 64* 65*   Cardiac Enzymes: No results for input(s): CKTOTAL, CKMB, CKMBINDEX, TROPONINI in the last 168 hours. BNP (last 3 results)  Recent Labs  05/03/15 1450 08/10/15 0001  BNP 379.7* 891.3*    ProBNP (last 3 results)  Recent Labs  07/20/15 1724  PROBNP 856.0*    CBG: No results for input(s): GLUCAP in the last 168 hours.  Recent Results (from the past 240 hour(s))  Blood culture (routine x 2)     Status: None   Collection Time: 08/10/15 12:01 AM  Result Value Ref Range Status   Specimen Description BLOOD LEFT ANTECUBITAL  Final   Special Requests BOTTLES DRAWN AEROBIC ONLY 5CC  Final   Culture  Setup Time   Final    GRAM POSITIVE RODS AEROBIC BOTTLE ONLY CRITICAL RESULT CALLED TO, READ BACK BY  AND VERIFIED WITH: Judie Petit [id number] RN [id number] 08/11/15 A BROWNING    Culture   Final    DIPHTHEROIDS(CORYNEBACTERIUM SPECIES) Standardized susceptibility testing for this organism is not available.    Report Status 08/13/2015 FINAL  Final  Blood culture (routine x 2)     Status: None   Collection Time: 08/10/15  1:40 AM  Result Value Ref Range Status   Specimen Description BLOOD RIGHT ANTECUBITAL  Final   Special Requests BOTTLES DRAWN AEROBIC AND ANAEROBIC 10CC   Final   Culture NO GROWTH 5 DAYS  Final   Report Status 08/15/2015 FINAL  Final  MRSA PCR Screening     Status: None   Collection Time: 08/10/15  4:35 AM  Result Value Ref Range Status   MRSA by PCR NEGATIVE NEGATIVE Final    Comment:        The GeneXpert MRSA Assay (FDA approved for NASAL  specimens only), is one component of a comprehensive MRSA colonization surveillance program. It is not intended to diagnose MRSA infection nor to guide or monitor treatment for MRSA infections.      Studies:  Recent x-ray studies have been reviewed in detail by the Attending Physician  Scheduled Meds:  Scheduled Meds: . amiodarone  400 mg Oral BID  . aspirin  81 mg Oral Daily  . clopidogrel  75 mg Oral Daily  . feeding supplement (ENSURE ENLIVE)  237 mL Oral BID BM  . furosemide  20 mg Intravenous Q12H  . imipenem-cilastatin  250 mg Intravenous 4 times per day  . ipratropium  0.5 mg Nebulization BID  . levalbuterol  0.63 mg Nebulization BID  . metoprolol  10 mg Intravenous 4 times per day  . sodium chloride  3 mL Intravenous Q12H    Time spent on care of this patient: 40 mins   Tom Macpherson, Roselind Messier , MD  [facility] Office  [phone] Pager [phone]  On-Call/Text Page:      [email or web address]      password [email or web address]  If 7PM-7AM, please contact night-coverage [email or web address] Password [email or web address] 08/19/2015, 7:46 AM   LOS: 9 days   Care during the described time interval was provided by me .  I have reviewed this patient's available data, including medical history, events of note, physical examination, and all test results as part of my evaluation. I have personally reviewed and interpreted all radiology studies.   Carolyne Littles, MD [phone] Pager

## 2015-08-19 NOTE — Progress Notes (Signed)
Patient transferred to 3E at this time by me.

## 2015-08-20 DIAGNOSIS — I4891 Unspecified atrial fibrillation: Secondary | ICD-10-CM | POA: Diagnosis present

## 2015-08-20 LAB — COMPREHENSIVE METABOLIC PANEL
ALT: 8 U/L — AB (ref 17–63)
AST: 20 U/L (ref 15–41)
Albumin: 2.6 g/dL — ABNORMAL LOW (ref 3.5–5.0)
Alkaline Phosphatase: 40 U/L (ref 38–126)
Anion gap: 6 (ref 5–15)
BUN: 55 mg/dL — ABNORMAL HIGH (ref 6–20)
CHLORIDE: 87 mmol/L — AB (ref 101–111)
CO2: 47 mmol/L — AB (ref 22–32)
CREATININE: 1.89 mg/dL — AB (ref 0.61–1.24)
Calcium: 8.6 mg/dL — ABNORMAL LOW (ref 8.9–10.3)
GFR, EST AFRICAN AMERICAN: 36 mL/min — AB (ref >60)
GFR, EST NON AFRICAN AMERICAN: 31 mL/min — AB (ref >60)
Glucose, Bld: 139 mg/dL — ABNORMAL HIGH (ref 65–99)
POTASSIUM: 4.1 mmol/L (ref 3.5–5.1)
SODIUM: 140 mmol/L (ref 135–145)
Total Bilirubin: 1.2 mg/dL (ref 0.3–1.2)
Total Protein: 5 g/dL — ABNORMAL LOW (ref 6.5–8.1)

## 2015-08-20 LAB — CBC WITH DIFFERENTIAL/PLATELET
Basophils Absolute: 0 10*3/uL (ref 0.0–0.1)
Basophils Relative: 0 %
Eosinophils Absolute: 0 10*3/uL (ref 0.0–0.7)
Eosinophils Relative: 0 %
HEMATOCRIT: 38.6 % — AB (ref 39.0–52.0)
HEMOGLOBIN: 12 g/dL — AB (ref 13.0–17.0)
LYMPHS ABS: 1 10*3/uL (ref 0.7–4.0)
LYMPHS PCT: 6 %
MCH: 33.5 pg (ref 26.0–34.0)
MCHC: 31.1 g/dL (ref 30.0–36.0)
MCV: 107.8 fL — AB (ref 78.0–100.0)
MONOS PCT: 7 %
Monocytes Absolute: 1.3 10*3/uL — ABNORMAL HIGH (ref 0.1–1.0)
NEUTROS ABS: 14.9 10*3/uL — AB (ref 1.7–7.7)
NEUTROS PCT: 87 %
Platelets: 81 10*3/uL — ABNORMAL LOW (ref 150–400)
RBC: 3.58 MIL/uL — AB (ref 4.22–5.81)
RDW: 13.9 % (ref 11.5–15.5)
WBC: 17.2 10*3/uL — AB (ref 4.0–10.5)

## 2015-08-20 LAB — MAGNESIUM: Magnesium: 1.7 mg/dL (ref 1.7–2.4)

## 2015-08-20 MED ORDER — METOPROLOL TARTRATE 1 MG/ML IV SOLN: 5.0000 mg | Freq: Once | INTRAVENOUS | Status: DC

## 2015-08-20 MED ORDER — SODIUM CHLORIDE 0.9 % IV BOLUS (SEPSIS)
500.0000 mL | Freq: Once | INTRAVENOUS | Status: AC
  Administered 2015-08-20: 500 mL via INTRAVENOUS

## 2015-08-20 NOTE — Progress Notes (Signed)
Physical Therapy Treatment Patient Details Name: Bill Walker MRN: [id number] DOB: Mar 21, 1928 Today's Date: 08/20/2015    History of Present Illness Bill Walker is a 81 y.o. male with Past medical history of COPD chronic respiratory failure on chronic oxygen, coronary artery disease, essential hypertension, chronic systolic CHF, dyslipidemia, chronic kidney disease. Admitted with SOB, progressive weakness, and loss of weight . Dx with acute on chronic CHF and respiratory failure    PT Comments    Pt with increased confusion and decreased mobility today. Wife reports pt became confused starting yesterday evening, hasn't been eating well and it is common for him to experience confusion in the acute setting. Pt with bil foot pain limiting gait coupled with confusion. Will continue to follow and encouraged HEP and mobility with staff.   Follow Up Recommendations  SNF;Supervision/Assistance - 24 hour     Equipment Recommendations       Recommendations for Other Services       Precautions / Restrictions Precautions Precautions: Fall Precaution Comments: incontinent,watch sats Restrictions Weight Bearing Restrictions: No    Mobility  Bed Mobility Overal bed mobility: Needs Assistance Bed Mobility: Supine to Sit     Supine to sit: Min assist     General bed mobility comments: cues for sequence with use of rail and assist to fully scoot to EOB with increased time  Transfers Overall transfer level: Needs assistance   Transfers: Sit to/from Stand Sit to Stand: Mod assist Stand pivot transfers: Min assist       General transfer comment: cues for sequence, hand placement and balance with mod assist for anterior translation and to rise from surface. With RW , max cues and assist to direct RW pivoted to chair as pt declined attempting gait today  Ambulation/Gait                 Stairs            Wheelchair Mobility    Modified Rankin (Stroke Patients  Only)       Balance Overall balance assessment: Needs assistance   Sitting balance-Leahy Scale: Fair       Standing balance-Leahy Scale: Poor                      Cognition Arousal/Alertness: Awake/alert Behavior During Therapy: Flat affect Overall Cognitive Status: Impaired/Different from baseline Area of Impairment: Orientation;Attention;Safety/judgement;Problem solving Orientation Level: Disoriented to;Time;Place Current Attention Level: Sustained     Safety/Judgement: Decreased awareness of safety;Decreased awareness of deficits   Problem Solving: Slow processing;Decreased initiation;Difficulty sequencing General Comments: HOH, decreased cognition and mobility from last session    Exercises General Exercises - Lower Extremity Long Arc Quad: Seated;Both;15 reps;AAROM Hip Flexion/Marching: AROM;Seated;Both;15 reps    General Comments        Pertinent Vitals/Pain Pain Score: 5  Pain Location: bil feet burning Pain Descriptors / Indicators: Burning Pain Intervention(s): Repositioned;Patient requesting pain meds-RN notified  HR 106 sats 96% on 4L    Home Living                      Prior Function            PT Goals (current goals can now be found in the care plan section) Progress towards PT goals: Progressing toward goals    Frequency       PT Plan Current plan remains appropriate    Co-evaluation             End  of Session Equipment Utilized During Treatment: Gait belt;Oxygen Activity Tolerance: Patient tolerated treatment well Patient left: in chair;with call bell/phone within reach;with chair alarm set;with family/visitor present     Time: 1610-9604 PT Time Calculation (min) (ACUTE ONLY): 25 min  Charges:  $Therapeutic Exercise: 8-22 mins $Therapeutic Activity: 8-22 mins                    G Codes:      Delorse Lek 08-Sep-2015, 8:19 AM Delaney Meigs, PT [phone]

## 2015-08-20 NOTE — Progress Notes (Signed)
[place] TEAM 1 - Stepdown/ICU TEAM Progress Note  Bill Walker WUJ:[id number] DOB: [birth year] DOA: 08/09/2015 PCP: Roxy Manns, MD  Admit HPI / Brief Narrative: Bill Walker is a 81 y.o.WM PMHx Cronic Rspiratory Filure on 3 L O2 at home,COPD, , CAD native artery, Essential HTN, Cronic Sstolic CHF, dyslipidemia, CKD.  The patient was brought in by wife as he has been progressively declining over last few months. The patient has loss of appetite along with loss of weight. Wife mentions patient has lost 60 pounds of weight. Patient denies any nausea vomiting or constipation or abdominal pain. No also diarrhea. Patient has been having chronic cough. No fever or chills. Patient has been more tired and lethargic. No chest pain or no fall recently. But since his recent admission in June patient has 3 falls are clear. Patient is hard of hearing therefore unable to provide any significant history. The history was taken with the help of patient's wife who is a primary caregiver for the patient. No recent change in his medications reported. No recent travel and no sick contacts.  The patient is coming from home  At his baseline ambulates with walker And is dependent for most of his ADL; does not manages his medication on his own.  HPI/Subjective: 10/7  A/O  4 , patient looking well, negative SOB, negative CP. Wife states patient actually ate today (most likely secondary to Megace).   Assessment/Plan: Acute on chronic respiratory failure/CAP -Per wife is on 3 L O2 via [place] at home 24 hours a day -Currently on 3 L O2 via  maintain SPO2 100%  -Last day of Primaxin (10 days of antibiotics)  COPD (chronic obstructive pulmonary disease) -Brovana BID -Budesonide BID -Xopenex PRN -Flutter valve  Diptheroids in blood cx -Blood cultures positive Diphtheroids (Corynebacterium). -Most c/w contaminant - no further w/u at this time   Acute on chronic systolic CHF (congestive heart  failure) -Strict I&O since admission;- -DC Lasix 20 mg BID (believe patient's lab findings secondary to overdiuresis) -Bolus normal saline -Continue Metoprolol 25 mg BID -Continue Pacerone  400 mg BID; may have to increase to 500 mg BID  Newly diagnosed Afib w/ RVR > NSR -Was rate controlled, however patient went back into RVR; Metoprolol IV 5 mg -See acute on chronic systolic CHF  Pulmonary hypertension -See CHF -At some point in the future may want to restart Imdur  Pulmonary edema -See CHF  Essential hypertension -See CHF  CAD native artery -With the symptoms ongoing for more than 2 months with gradual decline, his current presentation Does not appear to be any cardiac event at present.  Continue aspirin 81 mg daily and Plavix. 75 mg daily  BPH (benign prostatic hyperplasia) Continue Flomax.  Dyslipidemia -Restart Zocor 20 mg QHS  Protein calorie malnutrition/Anorexia -Regular diet Pt requires calories. Told wife to bring whatever she thinks he will eat  -Continue Megace 800 mg Daily  Leukocytosis -Most likely secondary to hemoconcentration as all lines have risen, however given patient's tenuous condition have low threshold to begin septic workup again.   Plan of care -After long talk with Mrs. Mariner concerning the side effects of intubation/CPR she requested that we make patient DO NOT RESUSCITATE, but continue the current level of care.. -Wife stated had meeting today with palliative care but did not make any decision, however did agree to DNR with me -Per wife [name] plan.    Code Status: FULL Family Communication: Wife present at time of  exam Disposition Plan: [name]     Consultants: Dr.Gene Neale Burly (palliative care)   Procedure/Significant Events: 7/5 echocardiogram;- Left ventricle: mild focalbasal hypertrophy of the septum.-LVEF=35%- 40%. akinesis of the apicalanteroseptal,  anterolateral, and apical myocardium. - Left atrium: moderately dilated.- Pulmonary arteries: PA peak pressure: 49 mm Hg (S).   Culture 9/27 blood left AC/positive Diphtheroids (Corynebacterium) 9/27 blood right AC NEGATIVE 9/27 MRSA by PCR negative   Antibiotics: Levaquin 9/27 >>stopped 9/28 Vanc 9/27 >> stopped 9/30 Primaxin 9/29>> stopped 10/6   DVT prophylaxis: subcutaneous Heparin    Devices    LINES / TUBES:      Continuous Infusions: . sodium chloride 10 mL/hr at 08/19/15 0600    Objective: VITAL SIGNS: Temp: 97.4 F (36.3 C) (10/07 1732) Temp Source: Oral (10/07 1732) BP: 111/59 mmHg (10/07 1808) Pulse Rate: 132 (10/07 1808) SPO2; FIO2:   Intake/Output Summary (Last 24 hours) at 08/20/15 1913 Last data filed at 08/20/15 1732  Gross per 24 hour  Intake 1421.67 ml  Output    100 ml  Net 1321.67 ml     Exam: General:  A/O  4 NAD, sitting in chair comfortably, chronic respiratory failure, currently on [place]  Eyes: Negative headache, eye pain, double vision,negative scleral hemorrhage ENT: Negative Runny nose, negative ear pain, negative gingival bleeding, Neck:  Negative scars, masses, torticollis, lymphadenopathy, JVD Lungs: clear to auscultation bilateral negative wheezes or crackles Cardiovascular: Irregular irregular rhythm and rate without murmur gallop or rub normal S1 and S2 Abdomen:negative abdominal pain, nondistended, positive soft, bowel sounds, no rebound, no ascites, no appreciable mass Extremities: No significant cyanosis, clubbing, positive bilateral pedal edema to hips 1-2+ Psychiatric:  Negative depression, negative anxiety, negative fatigue, negative mania  Neurologic:  Cranial nerves II through XII intact, tongue/uvula midline, all extremities muscle strength 5/5, sensation intact throughout, negative dysarthria, negative expressive aphasia, negative receptive aphasia.   Data Reviewed: Basic Metabolic Panel:  Recent Labs Lab  08/15/15 0225 08/16/15 0220 08/17/15 0301 08/19/15 0020 08/20/15 0855  NA 137 138 139 136 140  K 4.2 4.0 4.0 3.7 4.1  CL 82* 83* 86* 83* 87*  CO2 45* 47* 45* 46* 47*  GLUCOSE 106* 91 101* 128* 139*  BUN 56* 50* 46* 43* 55*  CREATININE 1.84* 1.65* 1.48* 1.54* 1.89*  CALCIUM 9.0 8.7* 8.4* 7.9* 8.6*  MG  --   --   --   --  1.7   Liver Function Tests:  Recent Labs Lab 08/14/15 0240 08/15/15 0225 08/16/15 0220 08/20/15 0855  AST 17 15 14* 20  ALT 11* 9* 7* 8*  ALKPHOS 35* 32* 34* 40  BILITOT 1.0 1.0 1.1 1.2  PROT 5.2* 4.9* 4.5* 5.0*  ALBUMIN 2.9* 3.0* 2.7* 2.6*   No results for input(s): LIPASE, AMYLASE in the last 168 hours. No results for input(s): AMMONIA in the last 168 hours. CBC:  Recent Labs Lab 08/15/15 0225 08/16/15 0220 08/17/15 0301 08/18/15 0305 08/19/15 0020 08/20/15 0855  WBC 7.1 6.7 12.1* 13.0* 12.3* 17.2*  NEUTROABS 6.0 5.7 10.9* 11.2*  --  14.9*  HGB 10.9* 10.8* 11.9* 11.8* 11.0* 12.0*  HCT 34.6* 35.4* 37.2* 37.8* 36.0* 38.6*  MCV 109.1* 108.6* 107.5* 109.2* 108.8* 107.8*  PLT 60* 67* 63* 64* 65* 81*   Cardiac Enzymes: No results for input(s): CKTOTAL, CKMB, CKMBINDEX, TROPONINI in the last 168 hours. BNP (last 3 results)  Recent Labs  05/03/15 1450 08/10/15 0001  BNP 379.7* 891.3*    ProBNP (last 3 results)  Recent Labs  07/20/15 1724  PROBNP 856.0*    CBG: No results for input(s): GLUCAP in the last 168 hours.  No results found for this or any previous visit (from the past 240 hour(s)).   Studies:  Recent x-ray studies have been reviewed in detail by the Attending Physician  Scheduled Meds:  Scheduled Meds: . amiodarone  400 mg Oral BID  . arformoterol  15 mcg Nebulization BID  . aspirin  81 mg Oral Daily  . budesonide (PULMICORT) nebulizer solution  0.25 mg Nebulization BID  . clopidogrel  75 mg Oral Daily  . feeding supplement (ENSURE ENLIVE)  237 mL Oral BID BM  . megestrol  800 mg Oral Daily  . metoprolol  5 mg  Intravenous Once  . metoprolol tartrate  25 mg Oral BID  . simvastatin  20 mg Oral QHS  . sodium chloride  3 mL Intravenous Q12H    Time spent on care of this patient: 40 mins   Kahleel Fadeley, Roselind Messier , MD  [facility] Office  [phone] Pager [phone]  On-Call/Text Page:      [email or web address]      password [email or web address]  If 7PM-7AM, please contact night-coverage [email or web address] Password [email or web address] 08/20/2015, 7:13 PM   LOS: 10 days   Care during the described time interval was provided by me .  I have reviewed this patient's available data, including medical history, events of note, physical examination, and all test results as part of my evaluation. I have personally reviewed and interpreted all radiology studies.   Carolyne Littles, MD [phone] Pager

## 2015-08-20 NOTE — Progress Notes (Signed)
Paged Dr. Joseph Art regarding pt now afib HR fluctuating from 110-140's. Vitals taken, stable. Will continue to monitor pt.       Jilda Panda RN

## 2015-08-20 NOTE — Progress Notes (Signed)
No change in current d/c plan.  To d/c to [facility] when medically stable.  Patient currently on IV lasix.  Awaiting stability per MD for return to facility.  Lorri Frederick. Jaci Lazier, [place] [phone]

## 2015-08-21 ENCOUNTER — Other Ambulatory Visit: Payer: Self-pay

## 2015-08-21 DIAGNOSIS — I5023 Acute on chronic systolic (congestive) heart failure: Secondary | ICD-10-CM

## 2015-08-21 DIAGNOSIS — J9621 Acute and chronic respiratory failure with hypoxia: Principal | ICD-10-CM

## 2015-08-21 DIAGNOSIS — I4891 Unspecified atrial fibrillation: Secondary | ICD-10-CM

## 2015-08-21 LAB — COMPREHENSIVE METABOLIC PANEL
ALK PHOS: 38 U/L (ref 38–126)
ALT: 6 U/L — AB (ref 17–63)
AST: 15 U/L (ref 15–41)
Albumin: 2.4 g/dL — ABNORMAL LOW (ref 3.5–5.0)
Anion gap: 9 (ref 5–15)
BILIRUBIN TOTAL: 1.1 mg/dL (ref 0.3–1.2)
BUN: 65 mg/dL — ABNORMAL HIGH (ref 6–20)
CALCIUM: 8.6 mg/dL — AB (ref 8.9–10.3)
CO2: 46 mmol/L — AB (ref 22–32)
CREATININE: 2.24 mg/dL — AB (ref 0.61–1.24)
Chloride: 86 mmol/L — ABNORMAL LOW (ref 101–111)
GFR calc non Af Amer: 25 mL/min — ABNORMAL LOW (ref >60)
GFR, EST AFRICAN AMERICAN: 29 mL/min — AB (ref >60)
GLUCOSE: 104 mg/dL — AB (ref 65–99)
Potassium: 4 mmol/L (ref 3.5–5.1)
SODIUM: 141 mmol/L (ref 135–145)
Total Protein: 4.7 g/dL — ABNORMAL LOW (ref 6.5–8.1)

## 2015-08-21 LAB — CBC
HEMATOCRIT: 35.6 % — AB (ref 39.0–52.0)
HEMOGLOBIN: 11.3 g/dL — AB (ref 13.0–17.0)
MCH: 34.7 pg — AB (ref 26.0–34.0)
MCHC: 31.7 g/dL (ref 30.0–36.0)
MCV: 109.2 fL — ABNORMAL HIGH (ref 78.0–100.0)
Platelets: 70 10*3/uL — ABNORMAL LOW (ref 150–400)
RBC: 3.26 MIL/uL — AB (ref 4.22–5.81)
RDW: 14.2 % (ref 11.5–15.5)
WBC: 12.7 10*3/uL — ABNORMAL HIGH (ref 4.0–10.5)

## 2015-08-21 LAB — MAGNESIUM: Magnesium: 1.9 mg/dL (ref 1.7–2.4)

## 2015-08-21 MED ORDER — DM-GUAIFENESIN ER 30-600 MG PO TB12
1.0000 | ORAL_TABLET | Freq: Two times a day (BID) | ORAL | Status: DC
  Administered 2015-08-21 – 2015-08-24 (×7): 1 via ORAL
  Filled 2015-08-21 (×9): qty 1

## 2015-08-21 MED ORDER — IPRATROPIUM BROMIDE 0.02 % IN SOLN
0.5000 mg | Freq: Four times a day (QID) | RESPIRATORY_TRACT | Status: DC
  Administered 2015-08-21 – 2015-08-22 (×3): 0.5 mg via RESPIRATORY_TRACT
  Filled 2015-08-21 (×3): qty 2.5

## 2015-08-21 MED ORDER — METOPROLOL TARTRATE 25 MG PO TABS
25.0000 mg | ORAL_TABLET | Freq: Four times a day (QID) | ORAL | Status: DC
  Administered 2015-08-21 – 2015-08-25 (×15): 25 mg via ORAL
  Filled 2015-08-21 (×15): qty 1

## 2015-08-21 MED ORDER — METHYLPREDNISOLONE SODIUM SUCC 125 MG IJ SOLR
60.0000 mg | Freq: Four times a day (QID) | INTRAMUSCULAR | Status: DC
  Administered 2015-08-21 – 2015-08-22 (×5): 60 mg via INTRAVENOUS
  Filled 2015-08-21 (×5): qty 2

## 2015-08-21 MED ORDER — METOPROLOL TARTRATE 1 MG/ML IV SOLN
5.0000 mg | Freq: Once | INTRAVENOUS | Status: AC
  Administered 2015-08-21: 5 mg via INTRAVENOUS
  Filled 2015-08-21: qty 5

## 2015-08-21 NOTE — Consult Note (Signed)
Primary cardiologist: Dr Charlton Haws Consulting cardiologist: Dr Dina Rich  Clinical Summary Bill Walker is a 81 y.o.male history of ICM, chronic systolic HF with LVEF 35-40%, CAD with prior stenting as documented below and last cath 04/2015 as documented below, HL, EtOH abuse, CKD III, COPD on home O2, RV dysfunction, admitted with SOB.    07/14/15 clinic wt: 202 lbs 08/2015 weight 192 lbs.  05/2015 echo LVEF 35-40%, multiple WMAs, PASP 49, normal RV function Na 141, Cr 2.24, BUN 65, K 4.0, Hgb 11.3, WBC 12.7, Plt 70, BNP 891 CXR bilateral airspace disease R>L, CHF vs pneumonia EKG afib RVR   Allergies  Allergen Reactions  . Doxazosin Mesylate Rash    rash  . Penicillins Other (See Comments)    REACTION: unknown reaction    Medications Scheduled Medications: . amiodarone  400 mg Oral BID  . arformoterol  15 mcg Nebulization BID  . aspirin  81 mg Oral Daily  . budesonide (PULMICORT) nebulizer solution  0.25 mg Nebulization BID  . clopidogrel  75 mg Oral Daily  . feeding supplement (ENSURE ENLIVE)  237 mL Oral BID BM  . ipratropium  0.5 mg Nebulization Q6H  . megestrol  800 mg Oral Daily  . methylPREDNISolone (SOLU-MEDROL) injection  60 mg Intravenous Q6H  . metoprolol tartrate  25 mg Oral BID  . simvastatin  20 mg Oral QHS  . sodium chloride  3 mL Intravenous Q12H     Infusions: . sodium chloride 10 mL/hr at 08/19/15 0600     PRN Medications:  acetaminophen **OR** acetaminophen, levalbuterol, ondansetron **OR** ondansetron (ZOFRAN) IV   Past Medical History  Diagnosis Date  . Ischemic cardiomyopathy     a. 02/2005 EF 32% (myoview report).  . Hypertension   . Coronary artery disease     a. s/p prior MI w/ RCA stenting;  b. 03/2011 Cath: LM 20d, LAD 40p/100p, LCX 40p, L->L collats to distal LAD, RCA dominant, patent prox stent, 16m ISR, 50-87m, 50-60d, 60d-->Med Rx;  c. 04/2015 NSTEMI/Cath: LM 45, LAD 80/47m, 100d (L->L collats), D2 99ost, OM1 60, RCA patent  stent prox, 94m, 70d, RPAV1/2 70->Med Rx.  Marland Kitchen Hyperlipidemia   . Vitamin B12 deficiency   . History of pneumonia   . Thrombocytopenia   . Cor pulmonale   . Hearing loss   . Alcohol abuse, unspecified   . Tobacco use disorder   . CKD (chronic kidney disease), stage III   . Osteoarthritis   . Gout   . Diverticulosis of colon   . COPD (chronic obstructive pulmonary disease)   . Anemia   . Bronchiectasis     CT Chest 07/07/09 bilateral lower lobe medial bronchiectasis  . Chronic systolic CHF (congestive heart failure)     a. 02/2005 EF 32% (myoview report).    Past Surgical History  Procedure Laterality Date  . Cardiac catheterization      (catheterization 2006 demonstrated an   occluded LAD.  There is a proximal stent in the RCA which was   patent.  He had distal 75% and 95% RCA lesions.  Initially it was  thought that he would have PCI but he was managed medically).  . Mri      Lumbar disk disease 06/1999  . Esophagogastroduodenoscopy      gastritis 12/1999  . Cardiac catheterization N/A 05/04/2015    Procedure: Left Heart Cath and Coronary Angiography;  Surgeon: Lennette Bihari, MD; LAD under percent, D2 99%, CFX patent, OM1 60% RCA  70%, previous stent is patent, no LV gram, 100 mL dye used    Family History  Problem Relation Age of Onset  . Lymphoma Brother   . Prostate cancer Brother     Social History Mr. Dobbins reports that he quit smoking about 18 years ago. He does not have any smokeless tobacco history on file. Mr. Amirault reports that he does not drink alcohol.  Review of Systems CONSTITUTIONAL: No weight loss, fever, chills, weakness or fatigue.  HEENT: Eyes: No visual loss, blurred vision, double vision or yellow sclerae. No hearing loss, sneezing, congestion, runny nose or sore throat.  SKIN: No rash or itching.  CARDIOVASCULAR: No chest pain, chest pressure or chest discomfort. No palpitations or edema.  RESPIRATORY: + SOB GASTROINTESTINAL: No anorexia, nausea,  vomiting or diarrhea. No abdominal pain or blood.  GENITOURINARY: no polyuria, no dysuria NEUROLOGICAL: No headache, dizziness, syncope, paralysis, ataxia, numbness or tingling in the extremities. No change in bowel or bladder control.  MUSCULOSKELETAL: No muscle, back pain, joint pain or stiffness.  HEMATOLOGIC: No anemia, bleeding or bruising.  LYMPHATICS: No enlarged nodes. No history of splenectomy.  PSYCHIATRIC: No history of depression or anxiety.      Physical Examination Blood pressure 117/58, pulse 88, temperature 98.2 F (36.8 C), temperature source Oral, resp. rate 20, height  (1.803 m), weight 192 lb 0.3 oz (87.1 kg), SpO2 97 %.  Intake/Output Summary (Last 24 hours) at 08/21/15 1226 Last data filed at 08/21/15 0946  Gross per 24 hour  Intake 1214.67 ml  Output      0 ml  Net 1214.67 ml    HEENT: sclera clear  Cardiovascular: irreg, tachy, no m/r/g, no JVD  Respiratory: clear anteriorally  GI: abdomen soft, NT, ND  MSK: trace bilateral edema  Neuro: no focal deficits  Psych: appropriate affect   Lab Results  Basic Metabolic Panel:  Recent Labs Lab 08/16/15 0220 08/17/15 0301 08/19/15 0020 08/20/15 0855 08/21/15 0358  NA 138 139 136 140 141  K 4.0 4.0 3.7 4.1 4.0  CL 83* 86* 83* 87* 86*  CO2 47* 45* 46* 47* 46*  GLUCOSE 91 101* 128* 139* 104*  BUN 50* 46* 43* 55* 65*  CREATININE 1.65* 1.48* 1.54* 1.89* 2.24*  CALCIUM 8.7* 8.4* 7.9* 8.6* 8.6*  MG  --   --   --  1.7 1.9    Liver Function Tests:  Recent Labs Lab 08/15/15 0225 08/16/15 0220 08/20/15 0855 08/21/15 0358  AST 15 14* 20 15  ALT 9* 7* 8* 6*  ALKPHOS 32* 34* 40 38  BILITOT 1.0 1.1 1.2 1.1  PROT 4.9* 4.5* 5.0* 4.7*  ALBUMIN 3.0* 2.7* 2.6* 2.4*    CBC:  Recent Labs Lab 08/15/15 0225 08/16/15 0220 08/17/15 0301 08/18/15 0305 08/19/15 0020 08/20/15 0855 08/21/15 0358  WBC 7.1 6.7 12.1* 13.0* 12.3* 17.2* 12.7*  NEUTROABS 6.0 5.7 10.9* 11.2*  --  14.9*  --     HGB 10.9* 10.8* 11.9* 11.8* 11.0* 12.0* 11.3*  HCT 34.6* 35.4* 37.2* 37.8* 36.0* 38.6* 35.6*  MCV 109.1* 108.6* 107.5* 109.2* 108.8* 107.8* 109.2*  PLT 60* 67* 63* 64* 65* 81* 70*    Cardiac Enzymes: No results for input(s): CKTOTAL, CKMB, CKMBINDEX, TROPONINI in the last 168 hours.  BNP: Invalid input(s): POCBNP   ECG   Imaging 04/2015 cath  Mid RCA lesion, 60% stenosed.  Dist LAD lesion, 100% stenosed.  Ost 2nd Diag lesion, 99% stenosed.  Mid LAD-2 lesion, 99% stenosed.  Mid  LAD-1 lesion, 80% stenosed.  LM lesion, 45% stenosed.  Dist RCA lesion, 70% stenosed.  Post Atrio-1 lesion, 70% stenosed.  Post Atrio-2 lesion, 70% stenosed.  1st Mrg lesion, 60% stenosed.  Significant coronary calcification with three-vessel coronary obstructive disease.  Impression/Recommendations  1. Acute on chronic systolic HF - he is down 10 lbs since his clinic visit with cardiology in late August. Uptrending Cr and BUN would suggests he is becoming hypovolemic. Exam is not consistent with significnat volume overload.  I/Os are incomplete. - do not see need for diuretics at this time.   2. COPD exacerbation - management per primary team  3. Afib - new onset this admission -  Patient is on amio  bid as well as metoprolol  bid. - he was loaded with IV amio 08/15/15, converted to oral 10/6.  - from notes has not been on anticoag due to low platelets, he has been continued on his home plavix and ASA.  - with COPD, systolic HF, and renal disease difficult to select ideal agents. His severe SOB and hypoxia create signicant demand for tachycardia as well. We will increase his lopressor to  q 6 hours with old parameters    Dina Rich, M.D.

## 2015-08-21 NOTE — Progress Notes (Signed)
[place] TEAM 1 - Stepdown/ICU TEAM Progress Note  Bill Walker ZSM:[id number] DOB: 1927-12-03 DOA: 08/09/2015 PCP: Loura Pardon, MD  Admit HPI / Brief Narrative: Bill Walker is a 81 y.o.WM PMHx Cronic Rspiratory Filure on 3 L O2 at home,COPD, , CAD native artery, Essential HTN, Cronic Sstolic CHF, dyslipidemia, CKD was admitted on 07/20/2015 with the complaints of progressive decline over the last few months, loss of weight of 60 pounds, cough and shortness of breath. He shouldn't at baseline was walking with the help of a walker, however patient has been having gradual decline. Has been having chronic cough with productive sputum. During the hospital stay patient was also found to have atrial fibrillation with rapid ventricular rate and is on metoprolol and amiodarone.No fever or chills. Had 3 falls since June/2016. Patient's wife who is a primary caregiver for the patient..  The patient is coming from home  At his baseline ambulates with walker And is dependent for most of his ADL; does not manages his medication on his own.  HPI/Subjective: 10/8  A/O  3 slow to respond. Had an episode of shortness of breath. Telemetry showing A. fib with RVR. Patient's wife wants him to be full code   Assessment/Plan: Acute on chronic respiratory failure/CAP Combination of factors: Congestive heart failure systolic, COPD exacerbation, pulmonary hypertension, possible cardio renal syndrome. Patient was initially diuresed: As the patient's creatinine was trending up Lasix was discontinued Treat underlying COPD exacerbation with DuoNeb nebs and Solu-Medrol. -Per wife is on 3 L O2 via [place] at home 24 hours a day -Currently on 3 L O2 via [place] maintain SPO2 100%    COPD (chronic obstructive pulmonary disease) exacerbation -[place] BID - keep on xopenex scheduled - add ipratropium scheduled - keep on solumedrol - Completed abx with levoquin, imipenem for 10days  Diptheroids in blood cx -Blood  cultures positive Diphtheroids (Corynebacterium). -Most c/w contaminant - no further w/u at this time   Acute on chronic systolic CHF (congestive heart failure) -Strict I&O since admission;- 152m -DC Lasix 20 mg BID (believe patient's lab findings secondary to overdiuresis) -Continue Metoprolol 25 mg BID -Continue Pacerone  400 mg BID; may have to increase to 500 mg BID  Newly diagnosed Afib w/ RVR > NSR -rate is poorly controlled. On metoprolol and amiodarone. Give 1 dsoe of lopressor, if not controlled may have to start on cardizem drip. -See acute on chronic systolic CHF Consulted cardiology  Ac on chr renal failure Worsened since yesterday   Pulmonary hypertension -See CHF -At some point in the future may want to restart Imdur  Pulmonary edema -See CHF  Essential hypertension -See CHF  CAD native artery -With the symptoms ongoing for more than 2 months with gradual decline, his current presentation Does not appear to be any cardiac event at present.  Continue aspirin 81 mg daily and Plavix. 75 mg daily  BPH (benign prostatic hyperplasia) Continue Flomax.  Dyslipidemia -Restart Zocor 20 mg QHS  Protein calorie malnutrition/Anorexia -Regular diet Pt requires calories. Told wife to bring whatever she thinks he will eat  -Continue Megace 800 mg Daily  Leukocytosis -Most likely secondary to hemoconcentration as all lines have risen, however given patient's tenuous condition have low threshold to begin septic workup again.   Plan of care Per previous records patient's wife has been met by the palliative care as well as the primary physician's and expressed interest for DO NOT RESUSCITATE. However today patient's wife wants to keep the patient on full code. Had  a long conversation with patient's 2 sons, wife and other family members explained about the patient's poor prognosis considering multiorgan failure, gradual decline. Currently patient is full code  -Per wife  [facility].    Code Status: FULL Family Communication: Wife present at time of exam Disposition Plan: [facility]     Consultants: Dr.Gene Domingo Cocking (palliative care)   Procedure/Significant Events: 7/5 echocardiogram;- Left ventricle: mild focalbasal hypertrophy of the septum.-LVEF=35%- 40%. akinesis of the apicalanteroseptal, anterolateral, and apical myocardium. - Left atrium: moderately dilated.- Pulmonary arteries: PA peak pressure: 49 mm Hg (S).   Culture 9/27 blood left AC/positive Diphtheroids (Corynebacterium) 9/27 blood right AC NEGATIVE 9/27 MRSA by PCR negative   Antibiotics: Levaquin 9/27 >>stopped 9/28 Vanc 9/27 >> stopped 9/30 Primaxin 9/29>> stopped 10/6   DVT prophylaxis: subcutaneous Heparin    Devices    LINES / TUBES:      Continuous Infusions: . sodium chloride 10 mL/hr at 08/19/15 0600    Objective: VITAL SIGNS: Temp: 97.9 F (36.6 C) (10/08 0533) Temp Source: Oral (10/08 0533) BP: 118/74 mmHg (10/08 0825) Pulse Rate: 152 (10/08 0825) SPO2; FIO2:   Intake/Output Summary (Last 24 hours) at 08/21/15 0918 Last data filed at 08/21/15 2355  Gross per 24 hour  Intake 1421.67 ml  Output      0 ml  Net 1421.67 ml     Exam: General:  A/O  4 NAD, sitting in chair comfortably, chronic respiratory failure, currently on [place]  Eyes: Negative headache, eye pain, double vision,negative scleral hemorrhage ENT: Negative Runny nose, negative ear pain, negative gingival bleeding, Neck:  Negative scars, masses, torticollis, lymphadenopathy, JVD Lungs: clear to auscultation bilateral negative wheezes or crackles Cardiovascular: Irregular irregular rhythm and rate without murmur gallop or rub normal S1 and S2 Abdomen:negative abdominal pain, nondistended, positive soft, bowel sounds, no rebound, no ascites, no appreciable mass Extremities: No significant cyanosis, clubbing, positive bilateral  pedal edema to hips 1-2+ Psychiatric:  Negative depression, negative anxiety, negative fatigue, negative mania  Neurologic:  Cranial nerves II through XII intact, tongue/uvula midline, all extremities muscle strength 5/5, sensation intact throughout, negative dysarthria, negative expressive aphasia, negative receptive aphasia.   Data Reviewed: Basic Metabolic Panel:  Recent Labs Lab 08/16/15 0220 08/17/15 0301 08/19/15 0020 08/20/15 0855 08/21/15 0358  NA 138 139 136 140 141  K 4.0 4.0 3.7 4.1 4.0  CL 83* 86* 83* 87* 86*  CO2 47* 45* 46* 47* 46*  GLUCOSE 91 101* 128* 139* 104*  BUN 50* 46* 43* 55* 65*  CREATININE 1.65* 1.48* 1.54* 1.89* 2.24*  CALCIUM 8.7* 8.4* 7.9* 8.6* 8.6*  MG  --   --   --  1.7 1.9   Liver Function Tests:  Recent Labs Lab 08/15/15 0225 08/16/15 0220 08/20/15 0855 08/21/15 0358  AST 15 14* 20 15  ALT 9* 7* 8* 6*  ALKPHOS 32* 34* 40 38  BILITOT 1.0 1.1 1.2 1.1  PROT 4.9* 4.5* 5.0* 4.7*  ALBUMIN 3.0* 2.7* 2.6* 2.4*   No results for input(s): LIPASE, AMYLASE in the last 168 hours. No results for input(s): AMMONIA in the last 168 hours. CBC:  Recent Labs Lab 08/15/15 0225 08/16/15 0220 08/17/15 0301 08/18/15 0305 08/19/15 0020 08/20/15 0855 08/21/15 0358  WBC 7.1 6.7 12.1* 13.0* 12.3* 17.2* 12.7*  NEUTROABS 6.0 5.7 10.9* 11.2*  --  14.9*  --   HGB 10.9* 10.8* 11.9* 11.8* 11.0* 12.0* 11.3*  HCT 34.6* 35.4* 37.2* 37.8* 36.0* 38.6* 35.6*  MCV 109.1* 108.6* 107.5* 109.2* 108.8* 107.8* 109.2*  PLT 60* 67* 63* 64* 65* 81* 70*   Cardiac Enzymes: No results for input(s): CKTOTAL, CKMB, CKMBINDEX, TROPONINI in the last 168 hours. BNP (last 3 results)  Recent Labs  05/03/15 1450 08/10/15 0001  BNP 379.7* 891.3*    ProBNP (last 3 results)  Recent Labs  07/20/15 1724  PROBNP 856.0*    CBG: No results for input(s): GLUCAP in the last 168 hours.  No results found for this or any previous visit (from the past 240 hour(s)).    Studies:  Recent x-ray studies have been reviewed in detail by the Attending Physician  Scheduled Meds:  Scheduled Meds: . amiodarone  400 mg Oral BID  . arformoterol  15 mcg Nebulization BID  . aspirin  81 mg Oral Daily  . budesonide (PULMICORT) nebulizer solution  0.25 mg Nebulization BID  . clopidogrel  75 mg Oral Daily  . feeding supplement (ENSURE ENLIVE)  237 mL Oral BID BM  . ipratropium  0.5 mg Nebulization Q6H  . megestrol  800 mg Oral Daily  . methylPREDNISolone (SOLU-MEDROL) injection  60 mg Intravenous Q6H  . metoprolol  5 mg Intravenous Once  . metoprolol  5 mg Intravenous Once  . metoprolol tartrate  25 mg Oral BID  . simvastatin  20 mg Oral QHS  . sodium chloride  3 mL Intravenous Q12H    Time spent on care of this patient: 52 mins   Justinn Welter , MD  [facility] Office  [phone] Pager - [phone]  On-Call/Text Page:      [email or web address]      password [email or web address]  If 7PM-7AM, please contact night-coverage [email or web address] Password [email or web address] 08/21/2015, 9:18 AM   LOS: 11 days   Care during the described time interval was provided by me .  I have reviewed this patient's available data, including medical history, events of note, physical examination, and all test results as part of my evaluation. I have personally reviewed and interpreted all radiology studies.

## 2015-08-22 MED ORDER — PREDNISONE 20 MG PO TABS
40.0000 mg | ORAL_TABLET | Freq: Every day | ORAL | Status: DC
  Administered 2015-08-23 – 2015-08-25 (×3): 40 mg via ORAL
  Filled 2015-08-22 (×3): qty 2

## 2015-08-22 NOTE — Progress Notes (Addendum)
PROGRESS NOTE    Anchor Dwan KZO:[id number] DOB: 08-26-1928 DOA: 08/09/2015 PCP: Roxy Manns, MD  HPI/Brief narrative 81 y.o.WM with history of chronic respiratory failure on home oxygen 3 L/m, COPD, CAD, HTN, chronic systolic CHF, HLD, CKD was admitted on 07/20/2015 with the complaints of progressive decline over the last few months, loss of weight of 60 pounds, chronic productive cough and shortness of breath. At baseline patient uses a walker. Patient has been having gradual decline. Had 3 falls since June/2016.      Assessment/Plan:  Acute on chronic respiratory failure with hypoxia and hypercapnia Combination of factors: Decompensated CHF, COPD exacerbation, pulmonary hypertension, possible cardio renal syndrome. Patient was initially diuresed: As the patient's creatinine was trending up Lasix was discontinued Treated underlying COPD exacerbation with DuoNeb nebs and Solu-Medrol. -Per wife is on 3 L O2 via [place] at home 24 hours a day - Acute respiratory failure resolved.  Community acquired pneumonia - Completed 10 days of antibiotics on 10/6  COPD exacerbation -Treated with oxygen, bronchodilators, IV Solu-Medrol and Brovana. Completed a course of antibiotics. Exacerbation seems to have resolved. Transition IV Solu-Medrol to oral prednisone taper.   Diptheroids in blood cx -Blood cultures positive Diphtheroids (Corynebacterium). -Most c/w contaminant - no further w/u at this time   Acute on chronic systolic CHF/pulmonary hypertension -Echo 7/16: LVEF 35-40 percent - Treated with IV Lasix but BUN/creatinine started trending up and hence held. Euvolemic clinically. As per cardiology follow-up, consider restarting oral Lasix tomorrow - Intake output charting probably not accurate.  Newly diagnosed Afib w/ RVR > NSR -New onset this admission - Loaded with IV amiodarone on 08/15/15 which was converted to oral amiodarone on 10/6  - Cardiology following. From records, not on  anticoagulants secondary to thrombocytopenia. Continued on home Plavix and aspirin  - Was in A. fib with RVR 10/8 AM but reverted to sinus rhythm.  - As per cardiology, continue amiodarone 400 MG twice a day until 10/13 then switch to 200 MG daily. Also on metoprolol 25 MG every 6 hours-transition to Toprol-XL 10/10   Acute on stage III chronic kidney disease - Baseline creatinine may be in the 1.6-1.8 range - Creatinine worsened from 1.8 on 10/7 to 2.24 on 10/8 and diuretics were held -Follow-up creatinine in a.m.   Essential hypertension - Controlled  CAD native artery - Asymptomatic  - Continue aspirin 81 mg daily and Plavix. 75 mg daily  BPH Continue Flomax.  Dyslipidemia - Continue 20 mg QHS  Protein calorie malnutrition/Anorexia -Continue Megace 800 mg Daily  Chronic anemia - Possibly of chronic disease. Stable  Thrombocytopenia - Unclear etiology but stable  Leukocytosis - May be from steroids.   Adult failure to thrive/Plan of care Per previous records patient's wife has been met by the palliative care as well as the primary physician's and expressed interest for DO NOT RESUSCITATE. However patient's wife wants to keep the patient on full code. Previous rounding [facility] MD's had long conversation with patient's 2 sons, wife and other family members explained about the patient's poor prognosis considering multiorgan failure, gradual decline. Currently patient is full code  Possible dementia    DVT prophylaxis: SCD's  Code Status: Full Family Communication: Discussed with spouse at bedside  Disposition Plan: DC to SNF possibly in the next 2 days.    Consultants:  Cardiology  Palliative care medicine  Procedures:  None  Antibiotics:  Levaquin 9/27 >>stopped 9/28  Vanc 9/27 >> stopped 9/30  Primaxin 9/29>> stopped 10/6  Subjective: Complaints of generalized weakness. Denies dyspnea, cough or chest pain. As per spouse, intermittent confusion-not  new.  Objective: Filed Vitals:   08/22/15 0455 08/22/15 0508 08/22/15 0806 08/22/15 1031  BP: 106/53 114/61  109/64  Pulse: 87 84  75  Temp: 98.8 F (37.1 C)     TempSrc: Oral     Resp: 18   18  Height:      Weight: 91.7 kg (202 lb 2.6 oz)     SpO2: 93%  94% 97%    Intake/Output Summary (Last 24 hours) at 08/22/15 1226 Last data filed at 08/22/15 1033  Gross per 24 hour  Intake    973 ml  Output    426 ml  Net    547 ml   Filed Weights   08/19/15 0424 08/20/15 0350 08/22/15 0455  Weight: 86.8 kg (191 lb 5.8 oz) 87.1 kg (192 lb 0.3 oz) 91.7 kg (202 lb 2.6 oz)     Exam:  General exam: pleasant elderly male, chronically ill-looking lying comfortably propped up in bed.  Respiratory system: Clear. No increased work of breathing. Cardiovascular system: S1 & S2 heard, RRR. No JVD, murmurs, gallops, clicks or pedal edema. telemetry: Reverted to sinus rhythm from A. fib on 10/8 and has remained in sinus rhythm.  Gastrointestinal system: Abdomen is nondistended, soft and nontender. Normal bowel sounds heard. Central nervous system: Alert and oriented to person and place . No focal neurological deficits. Extremities: Symmetric 5 x 5 power.   Data Reviewed: Basic Metabolic Panel:  Recent Labs Lab 08/16/15 0220 08/17/15 0301 08/19/15 0020 08/20/15 0855 08/21/15 0358  NA 138 139 136 140 141  K 4.0 4.0 3.7 4.1 4.0  CL 83* 86* 83* 87* 86*  CO2 47* 45* 46* 47* 46*  GLUCOSE 91 101* 128* 139* 104*  BUN 50* 46* 43* 55* 65*  CREATININE 1.65* 1.48* 1.54* 1.89* 2.24*  CALCIUM 8.7* 8.4* 7.9* 8.6* 8.6*  MG  --   --   --  1.7 1.9   Liver Function Tests:  Recent Labs Lab 08/16/15 0220 08/20/15 0855 08/21/15 0358  AST 14* 20 15  ALT 7* 8* 6*  ALKPHOS 34* 40 38  BILITOT 1.1 1.2 1.1  PROT 4.5* 5.0* 4.7*  ALBUMIN 2.7* 2.6* 2.4*   No results for input(s): LIPASE, AMYLASE in the last 168 hours. No results for input(s): AMMONIA in the last 168 hours. CBC:  Recent Labs Lab  08/16/15 0220 08/17/15 0301 08/18/15 0305 08/19/15 0020 08/20/15 0855 08/21/15 0358  WBC 6.7 12.1* 13.0* 12.3* 17.2* 12.7*  NEUTROABS 5.7 10.9* 11.2*  --  14.9*  --   HGB 10.8* 11.9* 11.8* 11.0* 12.0* 11.3*  HCT 35.4* 37.2* 37.8* 36.0* 38.6* 35.6*  MCV 108.6* 107.5* 109.2* 108.8* 107.8* 109.2*  PLT 67* 63* 64* 65* 81* 70*   Cardiac Enzymes: No results for input(s): CKTOTAL, CKMB, CKMBINDEX, TROPONINI in the last 168 hours. BNP (last 3 results)  Recent Labs  07/20/15 1724  PROBNP 856.0*   CBG: No results for input(s): GLUCAP in the last 168 hours.  No results found for this or any previous visit (from the past 240 hour(s)).         Studies: No results found.      Scheduled Meds: . amiodarone  400 mg Oral BID  . arformoterol  15 mcg Nebulization BID  . aspirin  81 mg Oral Daily  . budesonide (PULMICORT) nebulizer solution  0.25 mg Nebulization BID  . clopidogrel  75  mg Oral Daily  . dextromethorphan-guaiFENesin  1 tablet Oral BID  . feeding supplement (ENSURE ENLIVE)  237 mL Oral BID BM  . megestrol  800 mg Oral Daily  . methylPREDNISolone (SOLU-MEDROL) injection  60 mg Intravenous Q6H  . metoprolol tartrate  25 mg Oral Q6H  . simvastatin  20 mg Oral QHS  . sodium chloride  3 mL Intravenous Q12H   Continuous Infusions: . sodium chloride 10 mL/hr at 08/19/15 0600    Principal Problem:   Acute on chronic respiratory failure with hypoxia (HCC) Active Problems:   Essential hypertension   Coronary atherosclerosis   Chronic respiratory failure with hypercapnia (HCC)   BPH (benign prostatic hyperplasia)   COPD (chronic obstructive pulmonary disease) (HCC)   Acute on chronic systolic CHF (congestive heart failure) (HCC)   Dyslipidemia   Anorexia   Loss of weight   CAP (community acquired pneumonia)   Palliative care encounter   DNR (do not resuscitate) discussion   Pulmonary hypertension ([place])   Acute pulmonary edema (HCC)   CAD in native artery    Protein-calorie malnutrition ([place])   COPD exacerbation ([place])   Atrial fibrillation with RVR ([place])    Time spent:  109 minutes  Caili Escalera, MD, FACP, FHM. [facility] Pager [phone]  If 7PM-7AM, please contact night-coverage [email or web address] Password [email or web address] 08/22/2015, 12:26 PM    LOS: 12 days

## 2015-08-22 NOTE — Progress Notes (Signed)
Patient ID: Bill Walker, male   DOB: 12-Jan-1928, 81 y.o.   MRN: [id number]     Subjective:   No complaints today   Objective:   Temp:  [98.2 F (36.8 C)-99.1 F (37.3 C)] 98.8 F (37.1 C) (10/09 0455) Pulse Rate:  [76-94] 84 (10/09 0508) Resp:  [18-19] 18 (10/09 0455) BP: (106-121)/(53-61) 114/61 mmHg (10/09 0508) SpO2:  [93 %-97 %] 94 % (10/09 0806) Weight:  [202 lb 2.6 oz (91.7 kg)] 202 lb 2.6 oz (91.7 kg) (10/09 0455) Last BM Date: 08/19/15  Filed Weights   08/19/15 0424 08/20/15 0350 08/22/15 0455  Weight: 191 lb 5.8 oz (86.8 kg) 192 lb 0.3 oz (87.1 kg) 202 lb 2.6 oz (91.7 kg)    Intake/Output Summary (Last 24 hours) at 08/22/15 1001 Last data filed at 08/22/15 0940  Gross per 24 hour  Intake    970 ml  Output    426 ml  Net    544 ml    Telemetry: Afib overnight, back in NSR this AM  Exam:  General: NAD  Resp: CTAB  Cardiac: RRR, no m/r/g, no jvd  GI: abdomen soft, NT, ND  MSK: no LE edema  Neuro: no focal deficits  Psych: appropriate affect  Lab Results:  Basic Metabolic Panel:  Recent Labs Lab 08/19/15 0020 08/20/15 0855 08/21/15 0358  NA 136 140 141  K 3.7 4.1 4.0  CL 83* 87* 86*  CO2 46* 47* 46*  GLUCOSE 128* 139* 104*  BUN 43* 55* 65*  CREATININE 1.54* 1.89* 2.24*  CALCIUM 7.9* 8.6* 8.6*  MG  --  1.7 1.9    Liver Function Tests:  Recent Labs Lab 08/16/15 0220 08/20/15 0855 08/21/15 0358  AST 14* 20 15  ALT 7* 8* 6*  ALKPHOS 34* 40 38  BILITOT 1.1 1.2 1.1  PROT 4.5* 5.0* 4.7*  ALBUMIN 2.7* 2.6* 2.4*    CBC:  Recent Labs Lab 08/19/15 0020 08/20/15 0855 08/21/15 0358  WBC 12.3* 17.2* 12.7*  HGB 11.0* 12.0* 11.3*  HCT 36.0* 38.6* 35.6*  MCV 108.8* 107.8* 109.2*  PLT 65* 81* 70*    Cardiac Enzymes: No results for input(s): CKTOTAL, CKMB, CKMBINDEX, TROPONINI in the last 168 hours.  BNP:  Recent Labs  07/20/15 1724  PROBNP 856.0*    Coagulation: No results for input(s): INR in the last 168  hours.  ECG:   Medications:   Scheduled Medications: . amiodarone  400 mg Oral BID  . arformoterol  15 mcg Nebulization BID  . aspirin  81 mg Oral Daily  . budesonide (PULMICORT) nebulizer solution  0.25 mg Nebulization BID  . clopidogrel  75 mg Oral Daily  . dextromethorphan-guaiFENesin  1 tablet Oral BID  . feeding supplement (ENSURE ENLIVE)  237 mL Oral BID BM  . megestrol  800 mg Oral Daily  . methylPREDNISolone (SOLU-MEDROL) injection  60 mg Intravenous Q6H  . metoprolol tartrate  25 mg Oral Q6H  . simvastatin  20 mg Oral QHS  . sodium chloride  3 mL Intravenous Q12H     Infusions: . sodium chloride 10 mL/hr at 08/19/15 0600     PRN Medications:  acetaminophen **OR** acetaminophen, levalbuterol, ondansetron **OR** ondansetron (ZOFRAN) IV     Assessment/Plan    1. Acute on chronic systolic HF - echo 05/2015 LVEF 35-40% - he is down 10 lbs since his clinic visit with cardiology in late August. Uptrending Cr and BUN would suggests he is becoming hypovolemic. Exam is not consistent with significnat  volume overload. I/Os are incomplete. - do not see need for diuretics at this time. Follow labs pending renal function would consider restarting his oral lasix tomorrow.   2. COPD exacerbation - management per primary team  3. Afib - new onset this admission -Patient is on amio  bid as well as metoprolol  bid. - he was loaded with IV amio 08/15/15, converted to oral 10/6. Would continue  bid until 10/13, then   - from notes has not been on anticoag due to low platelets, he has been continued on his home plavix and ASA.  - with COPD, systolic HF, and renal disease difficult to select ideal agents. His severe SOB and hypoxia create signicant demand for tachycardia as well.  - back in NSR this AM rates 70s. Would continue short acting lopressor today, if hemodynamics remain stable would transition to Toprol XL tomorrow given his systolic  dysfunction       Dina Rich, M.D.

## 2015-08-23 ENCOUNTER — Inpatient Hospital Stay (HOSPITAL_COMMUNITY): Payer: Medicare Other

## 2015-08-23 DIAGNOSIS — J410 Simple chronic bronchitis: Secondary | ICD-10-CM

## 2015-08-23 DIAGNOSIS — I480 Paroxysmal atrial fibrillation: Secondary | ICD-10-CM

## 2015-08-23 DIAGNOSIS — J810 Acute pulmonary edema: Secondary | ICD-10-CM

## 2015-08-23 DIAGNOSIS — I2510 Atherosclerotic heart disease of native coronary artery without angina pectoris: Secondary | ICD-10-CM

## 2015-08-23 DIAGNOSIS — J9612 Chronic respiratory failure with hypercapnia: Secondary | ICD-10-CM

## 2015-08-23 DIAGNOSIS — J189 Pneumonia, unspecified organism: Secondary | ICD-10-CM

## 2015-08-23 DIAGNOSIS — Z7189 Other specified counseling: Secondary | ICD-10-CM

## 2015-08-23 DIAGNOSIS — I5020 Unspecified systolic (congestive) heart failure: Secondary | ICD-10-CM | POA: Insufficient documentation

## 2015-08-23 LAB — CBC
HEMATOCRIT: 32.7 % — AB (ref 39.0–52.0)
HEMOGLOBIN: 10.2 g/dL — AB (ref 13.0–17.0)
MCH: 33.6 pg (ref 26.0–34.0)
MCHC: 31.2 g/dL (ref 30.0–36.0)
MCV: 107.6 fL — AB (ref 78.0–100.0)
Platelets: 95 10*3/uL — ABNORMAL LOW (ref 150–400)
RBC: 3.04 MIL/uL — ABNORMAL LOW (ref 4.22–5.81)
RDW: 14.1 % (ref 11.5–15.5)
WBC: 12 10*3/uL — ABNORMAL HIGH (ref 4.0–10.5)

## 2015-08-23 LAB — BASIC METABOLIC PANEL
ANION GAP: 8 (ref 5–15)
BUN: 86 mg/dL — ABNORMAL HIGH (ref 6–20)
CHLORIDE: 83 mmol/L — AB (ref 101–111)
CO2: 47 mmol/L — AB (ref 22–32)
Calcium: 9 mg/dL (ref 8.9–10.3)
Creatinine, Ser: 2.52 mg/dL — ABNORMAL HIGH (ref 0.61–1.24)
GFR calc Af Amer: 25 mL/min — ABNORMAL LOW (ref >60)
GFR calc non Af Amer: 22 mL/min — ABNORMAL LOW (ref >60)
GLUCOSE: 147 mg/dL — AB (ref 65–99)
POTASSIUM: 4.9 mmol/L (ref 3.5–5.1)
Sodium: 138 mmol/L (ref 135–145)

## 2015-08-23 LAB — GLUCOSE, CAPILLARY: Glucose-Capillary: 134 mg/dL — ABNORMAL HIGH (ref 65–99)

## 2015-08-23 MED ORDER — FUROSEMIDE 10 MG/ML IJ SOLN
40.0000 mg | Freq: Once | INTRAMUSCULAR | Status: AC
  Administered 2015-08-23: 40 mg via INTRAVENOUS
  Filled 2015-08-23: qty 4

## 2015-08-23 NOTE — Progress Notes (Signed)
Subjective: Very sleepy.  Objective: Vital signs in last 24 hours: Temp:  [97.6 F (36.4 C)-97.9 F (36.6 C)] 97.9 F (36.6 C) (10/10 0424) Pulse Rate:  [73-79] 73 (10/10 0424) Resp:  [18-22] 21 (10/10 0424) BP: (109-121)/(51-64) 112/54 mmHg (10/10 0424) SpO2:  [85 %-100 %] 95 % (10/10 0856) FiO2 (%):  [40 %] 40 % (10/10 0856) Weight:  [192 lb 0.3 oz (87.1 kg)] 192 lb 0.3 oz (87.1 kg) (10/10 0424) Last BM Date: 08/22/15  Intake/Output from previous day: 10/09 0701 - 10/10 0700 In: 983 [P.O.:980; I.V.:3] Out: 701 [Urine:700; Stool:1] Intake/Output this shift:    Medications Scheduled Meds: . amiodarone  400 mg Oral BID  . arformoterol  15 mcg Nebulization BID  . aspirin  81 mg Oral Daily  . budesonide (PULMICORT) nebulizer solution  0.25 mg Nebulization BID  . clopidogrel  75 mg Oral Daily  . dextromethorphan-guaiFENesin  1 tablet Oral BID  . feeding supplement (ENSURE ENLIVE)  237 mL Oral BID BM  . megestrol  800 mg Oral Daily  . metoprolol tartrate  25 mg Oral Q6H  . predniSONE  40 mg Oral Q breakfast  . simvastatin  20 mg Oral QHS  . sodium chloride  3 mL Intravenous Q12H   Continuous Infusions:  PRN Meds:.acetaminophen **OR** [DISCONTINUED] acetaminophen, levalbuterol, ondansetron **OR** [DISCONTINUED] ondansetron (ZOFRAN) IV  PE: General appearance: alert, cooperative, no distress and Very sleepy Lungs: decreased BS and crackles on the right. bronchial BS on the left Heart: regular rate and rhythm and 1/6 sys MM Abdomen: +BS, nontender Extremities: 2+ lower and upper ext edema.  Pulses: 2+ and symmetric Skin: warm and dry Neurologic: Grossly normal  Lab Results:   Recent Labs  08/21/15 0358 08/23/15 0334  WBC 12.7* 12.0*  HGB 11.3* 10.2*  HCT 35.6* 32.7*  PLT 70* 95*   BMET  Recent Labs  08/21/15 0358 08/23/15 0334  NA 141 138  K 4.0 4.9  CL 86* 83*  CO2 46* 47*  GLUCOSE 104* 147*  BUN 65* 86*  CREATININE 2.24* 2.52*  CALCIUM 8.6*  9.0     Assessment/Plan 81 y.o.male history of ICM, chronic systolic HF with LVEF 35-40%, CAD with prior stenting as documented below and last cath 04/2015 as documented below, HL, EtOH abuse, CKD III, COPD on home O2, RV dysfunction, admitted with SOB.  Principal Problem:   Acute on chronic respiratory failure with hypoxia (HCC) Active Problems:   Essential hypertension   Coronary atherosclerosis   Chronic respiratory failure with hypercapnia (HCC)   BPH (benign prostatic hyperplasia)   COPD (chronic obstructive pulmonary disease) (HCC)   Acute on chronic systolic CHF (congestive heart failure) (HCC)   Dyslipidemia   Anorexia   Loss of weight   CAP (community acquired pneumonia)   Palliative care encounter   DNR (do not resuscitate) discussion   Pulmonary hypertension (HCC)   Acute pulmonary edema (HCC)   CAD in native artery   Protein-calorie malnutrition (HCC)   COPD exacerbation (HCC)   Atrial fibrillation with RVR (HCC)  1. Acute on chronic systolic HF Net fluids: +0.3L/+0.6L.   Chart weight is stable, however, he has 2+ pitting edema in legs and arms.  Coarse crackles on the right.   SCr continues to increase; 2.24>>2.52 .Marland Kitchen Uptrending Cr and BUN would suggests he is becoming hypovolemic but he appears overloaded on exam.    Not currently on a diuretic.  Check CXR then consider adding diuretic  2. COPD exacerbation -  management per primary team  3. Afib - new onset this admission.  Back in NSR.   - Patient is on amio  bid as well as metoprolol  bid. - he was loaded with IV amio 08/15/15, converted to oral 10/6.  - from notes has not been on anticoag due to low platelets, he has been continued on his home plavix and ASA.  - with COPD, systolic HF, and renal disease difficult to select ideal agents. His severe SOB and hypoxia create signicant demand for tachycardia as well.  Lopressor increased to  q 6 hours yesterday.  No changes to meds.    LOS: 13 days      HAGER, BRYAN PA-C 08/23/2015 9:44 AM   Agree with note written by [facility]  Pt admitted with Afib/CHF. Was on PO lasix 80/40 as OP. EF 40% with WMA in LAD distrib. Cath in July by TK--->> Med Rx. On Amio load. Currently in NSR. BUN/SCr have steadily increased. Diuretics on hold. He has 2+ pitting edema and increased JVP. His labs suggest that he's dry but exam suggests otherwise. Agree with no OAC given thrombocytopenia.  Nanetta Batty 08/23/2015 11:18 AM

## 2015-08-23 NOTE — Care Management Important Message (Signed)
Important Message  Patient Details  Name: Bill Walker MRN: [id number] Date of Birth: 1928-10-28   Medicare Important Message Given:  Yes-fourth notification given    Orson Aloe 08/23/2015, 1:59 PM

## 2015-08-23 NOTE — Progress Notes (Signed)
No change in current d/c plan for d/c to [facility] when medically stable.  CSW will continue to monitor and will assist with d/c when stable.  Lorri Frederick. Jaci Lazier, [place] [phone]  .

## 2015-08-23 NOTE — Progress Notes (Addendum)
PROGRESS NOTE    Bill Walker YIR:[id number] DOB: 06/02/1928 DOA: 08/09/2015 PCP: Loura Pardon, MD  HPI/Brief narrative 81 y.o.WM with history of chronic respiratory failure on home oxygen 3 L/m, COPD, CAD, HTN, chronic systolic CHF, HLD, CKD was admitted on 07/20/2015 with the complaints of progressive decline over the last few months, loss of weight of 60 pounds, chronic productive cough and shortness of breath. At baseline patient uses a walker. Patient has been having gradual decline. Had 3 falls since June/2016.    Assessment/Plan:  Acute on chronic respiratory failure with hypoxia and hypercapnia Combination of factors: Decompensated CHF, COPD exacerbation, pulmonary hypertension, possible cardio renal syndrome. Patient was initially diuresed: As the patient's creatinine was trending up Lasix was discontinued Treated underlying COPD exacerbation with DuoNeb nebs and Solu-Medrol. -Per wife is on 3 L O2 via [place] at home 24 hours a day - Acute respiratory failure resolved.  Community acquired pneumonia - Completed 10 days of antibiotics on 10/6. Treated  COPD exacerbation -Treated with oxygen, bronchodilators, IV Solu-Medrol and Brovana. Completed a course of antibiotics. Exacerbation seems to have resolved. Transitioned IV Solu-Medrol to oral prednisone taper.   Diptheroids in blood cx -Blood cultures positive Diphtheroids (Corynebacterium). -Most c/w contaminant - no further w/u at this time   Acute on chronic systolic CHF/pulmonary hypertension -Echo 7/16: LVEF 35-40 percent - Treated with IV Lasix but BUN/creatinine started trending up and hence held.  - Intake output charting probably not accurate. -  has features of volume overload (2+ pitting leg edema and increased JVP and chest x-ray consistent with pulmonary edema)  - Starting back diuretics today. Monitor. Cardiology following.  Newly diagnosed Afib w/ RVR > NSR -New onset this admission - Loaded with IV  amiodarone on 08/15/15 which was converted to oral amiodarone on 10/6  - Cardiology following. From records, not on anticoagulants secondary to thrombocytopenia. Continued on home Plavix and aspirin  - Was in A. fib with RVR 10/8 AM but reverted to sinus rhythm.  - As per cardiology, continue amiodarone 400 MG twice a day until 10/13 then switch to 200 MG daily. Also on metoprolol 25 MG every 6 hours-transition to Toprol-XL 10/10   Acute on stage III chronic kidney disease - Baseline creatinine may be in the 1.6-1.8 range - Creatinine worsened from 1.8 on 10/7 to 2.>2.5  and diuretics were held. May be cardiorenal syndrome.  -  diuretics being resumed 10/10. Follow BMP in a.m.   Essential hypertension - Controlled  CAD native artery - Asymptomatic  - Continue aspirin 81 mg daily and Plavix 75 mg daily  BPH Continue Flomax.  Dyslipidemia - Continue 20 mg QHS  Protein calorie malnutrition/Anorexia -Continue Megace 800 mg Daily  Chronic anemia - Possibly of chronic disease. Stable  Thrombocytopenia - Unclear etiology but stable  Leukocytosis - May be from steroids.   Adult failure to thrive/Plan of care Per previous records patient's wife has been met by the palliative care as well as the primary physician's and expressed interest for DO NOT RESUSCITATE. However patient's wife wants to keep the patient on full code. Previous rounding [facility] MD's had long conversation with patient's 2 sons, wife and other family members explained about the patient's poor prognosis considering multiorgan failure, gradual decline. Currently patient is full code  Possible dementia    DVT prophylaxis: SCD's  Code Status: Full Family Communication: Discussed with spouse at bedside  Disposition Plan: DC to SNF when medically stable    Consultants:  Cardiology  Palliative care medicine  Procedures:  None  Antibiotics:  Levaquin 9/27 >>stopped 9/28  Vanc 9/27 >> stopped 9/30  Primaxin  9/29>> stopped 10/6  Subjective:  no new complaints reported. Denies dyspnea.   Objective: Filed Vitals:   08/23/15 0841 08/23/15 0856 08/23/15 1000 08/23/15 1227  BP:   118/61 113/55  Pulse:   72 72  Temp:    98.6 F (37 C)  TempSrc:    Oral  Resp:    18  Height:      Weight:      SpO2: 85% 95% 100% 97%    Intake/Output Summary (Last 24 hours) at 08/23/15 1337 Last data filed at 08/23/15 0900  Gross per 24 hour  Intake    740 ml  Output    500 ml  Net    240 ml   Filed Weights   08/20/15 0350 08/22/15 0455 08/23/15 0424  Weight: 87.1 kg (192 lb 0.3 oz) 91.7 kg (202 lb 2.6 oz) 87.1 kg (192 lb 0.3 oz)     Exam:  General exam: pleasant elderly male, chronically ill-looking lying comfortably propped up in bed.  Respiratory system: Clear. No increased work of breathing. Cardiovascular system: S1 & S2 heard, RRR. No JVD, murmurs, gallops, clicks. 2+ pedal edema. Telemetry: Reverted to sinus rhythm from A. fib on 10/8 and has remained in sinus rhythm.  Gastrointestinal system: Abdomen is nondistended, soft and nontender. Normal bowel sounds heard. Central nervous system: Alert and oriented to person and place . No focal neurological deficits. Extremities: Symmetric 5 x 5 power.   Data Reviewed: Basic Metabolic Panel:  Recent Labs Lab 08/17/15 0301 08/19/15 0020 08/20/15 0855 08/21/15 0358 08/23/15 0334  NA 139 136 140 141 138  K 4.0 3.7 4.1 4.0 4.9  CL 86* 83* 87* 86* 83*  CO2 45* 46* 47* 46* 47*  GLUCOSE 101* 128* 139* 104* 147*  BUN 46* 43* 55* 65* 86*  CREATININE 1.48* 1.54* 1.89* 2.24* 2.52*  CALCIUM 8.4* 7.9* 8.6* 8.6* 9.0  MG  --   --  1.7 1.9  --    Liver Function Tests:  Recent Labs Lab 08/20/15 0855 08/21/15 0358  AST 20 15  ALT 8* 6*  ALKPHOS 40 38  BILITOT 1.2 1.1  PROT 5.0* 4.7*  ALBUMIN 2.6* 2.4*   No results for input(s): LIPASE, AMYLASE in the last 168 hours. No results for input(s): AMMONIA in the last 168 hours. CBC:  Recent  Labs Lab 08/17/15 0301 08/18/15 0305 08/19/15 0020 08/20/15 0855 08/21/15 0358 08/23/15 0334  WBC 12.1* 13.0* 12.3* 17.2* 12.7* 12.0*  NEUTROABS 10.9* 11.2*  --  14.9*  --   --   HGB 11.9* 11.8* 11.0* 12.0* 11.3* 10.2*  HCT 37.2* 37.8* 36.0* 38.6* 35.6* 32.7*  MCV 107.5* 109.2* 108.8* 107.8* 109.2* 107.6*  PLT 63* 64* 65* 81* 70* 95*   Cardiac Enzymes: No results for input(s): CKTOTAL, CKMB, CKMBINDEX, TROPONINI in the last 168 hours. BNP (last 3 results)  Recent Labs  07/20/15 1724  PROBNP 856.0*   CBG: No results for input(s): GLUCAP in the last 168 hours.  No results found for this or any previous visit (from the past 240 hour(s)).         Studies: Dg Chest 2 View  08/23/2015   CLINICAL DATA:  Dyspnea, hypoxia.  EXAM: CHEST  2 VIEW  COMPARISON:  August 15, 2015.  FINDINGS: Stable cardiomegaly with central pulmonary vascular congestion is noted. Stable bilateral pleural effusions are noted.  Stable interstitial densities are noted throughout both lungs most consistent with edema or possibly pneumonia. Bony thorax is unremarkable.  IMPRESSION: Stable cardiomegaly and probable bilateral pulmonary edema with associated pleural effusions.   Electronically Signed   By: Marijo Conception, M.D.   On: 08/23/2015 11:59        Scheduled Meds: . amiodarone  400 mg Oral BID  . arformoterol  15 mcg Nebulization BID  . aspirin  81 mg Oral Daily  . budesonide (PULMICORT) nebulizer solution  0.25 mg Nebulization BID  . clopidogrel  75 mg Oral Daily  . dextromethorphan-guaiFENesin  1 tablet Oral BID  . feeding supplement (ENSURE ENLIVE)  237 mL Oral BID BM  . furosemide  40 mg Intravenous Once  . megestrol  800 mg Oral Daily  . metoprolol tartrate  25 mg Oral Q6H  . predniSONE  40 mg Oral Q breakfast  . simvastatin  20 mg Oral QHS  . sodium chloride  3 mL Intravenous Q12H   Continuous Infusions:    Principal Problem:   Acute on chronic respiratory failure with hypoxia  (HCC) Active Problems:   Essential hypertension   Coronary atherosclerosis   Chronic respiratory failure with hypercapnia (HCC)   BPH (benign prostatic hyperplasia)   COPD (chronic obstructive pulmonary disease) (HCC)   Acute on chronic systolic CHF (congestive heart failure) (HCC)   Dyslipidemia   Anorexia   Loss of weight   CAP (community acquired pneumonia)   Palliative care encounter   DNR (do not resuscitate) discussion   Pulmonary hypertension ([place])   Acute pulmonary edema (HCC)   CAD in native artery   Protein-calorie malnutrition ([place])   COPD exacerbation ([place])   Atrial fibrillation with RVR ([place])   Systolic congestive heart failure ([place])    Time spent:  30 minutes  Leiam Hopwood, MD, FACP, FHM. [facility] Pager [phone]  If 7PM-7AM, please contact night-coverage [email or web address] Password [email or web address] 08/23/2015, 1:37 PM    LOS: 13 days

## 2015-08-24 ENCOUNTER — Inpatient Hospital Stay (HOSPITAL_COMMUNITY): Payer: Medicare Other

## 2015-08-24 DIAGNOSIS — R627 Adult failure to thrive: Secondary | ICD-10-CM

## 2015-08-24 DIAGNOSIS — N183 Chronic kidney disease, stage 3 unspecified: Secondary | ICD-10-CM | POA: Insufficient documentation

## 2015-08-24 DIAGNOSIS — N179 Acute kidney failure, unspecified: Secondary | ICD-10-CM | POA: Insufficient documentation

## 2015-08-24 DIAGNOSIS — R601 Generalized edema: Secondary | ICD-10-CM | POA: Insufficient documentation

## 2015-08-24 DIAGNOSIS — E875 Hyperkalemia: Secondary | ICD-10-CM | POA: Insufficient documentation

## 2015-08-24 LAB — BASIC METABOLIC PANEL
ANION GAP: 10 (ref 5–15)
ANION GAP: 10 (ref 5–15)
BUN: 101 mg/dL — ABNORMAL HIGH (ref 6–20)
BUN: 105 mg/dL — ABNORMAL HIGH (ref 6–20)
CALCIUM: 9.1 mg/dL (ref 8.9–10.3)
CALCIUM: 8.8 mg/dL — AB (ref 8.9–10.3)
CO2: 43 mmol/L — ABNORMAL HIGH (ref 22–32)
CO2: 44 mmol/L — ABNORMAL HIGH (ref 22–32)
Chloride: 84 mmol/L — ABNORMAL LOW (ref 101–111)
Chloride: 85 mmol/L — ABNORMAL LOW (ref 101–111)
Creatinine, Ser: 2.9 mg/dL — ABNORMAL HIGH (ref 0.61–1.24)
Creatinine, Ser: 3.04 mg/dL — ABNORMAL HIGH (ref 0.61–1.24)
GFR calc non Af Amer: 17 mL/min — ABNORMAL LOW (ref >60)
GFR, EST AFRICAN AMERICAN: 21 mL/min — AB (ref >60)
GFR, EST AFRICAN AMERICAN: 20 mL/min — AB (ref >60)
GFR, EST NON AFRICAN AMERICAN: 18 mL/min — AB (ref >60)
Glucose, Bld: 116 mg/dL — ABNORMAL HIGH (ref 65–99)
Glucose, Bld: 160 mg/dL — ABNORMAL HIGH (ref 65–99)
Potassium: 6 mmol/L — ABNORMAL HIGH (ref 3.5–5.1)
Potassium: 5.2 mmol/L — ABNORMAL HIGH (ref 3.5–5.1)
SODIUM: 137 mmol/L (ref 135–145)
SODIUM: 139 mmol/L (ref 135–145)

## 2015-08-24 LAB — OSMOLALITY, URINE: OSMOLALITY UR: 350 mosm/kg — AB (ref 390–1090)

## 2015-08-24 MED ORDER — SODIUM CHLORIDE 0.9 % IJ SOLN: 3.0000 mL | INTRAMUSCULAR | Status: DC | PRN

## 2015-08-24 MED ORDER — SODIUM CHLORIDE 0.9 % IV SOLN: 250.0000 mL | INTRAVENOUS | Status: DC | PRN

## 2015-08-24 MED ORDER — SODIUM CHLORIDE 0.9 % IV SOLN
INTRAVENOUS | Status: DC
  Administered 2015-08-25: 06:00:00 via INTRAVENOUS

## 2015-08-24 MED ORDER — ASPIRIN 81 MG PO CHEW
81.0000 mg | CHEWABLE_TABLET | ORAL | Status: AC
  Administered 2015-08-25: 81 mg via ORAL
  Filled 2015-08-24: qty 1

## 2015-08-24 MED ORDER — FUROSEMIDE 10 MG/ML IJ SOLN
80.0000 mg | Freq: Two times a day (BID) | INTRAMUSCULAR | Status: DC
  Administered 2015-08-24: 80 mg via INTRAVENOUS
  Filled 2015-08-24: qty 8

## 2015-08-24 MED ORDER — SODIUM POLYSTYRENE SULFONATE 15 GM/60ML PO SUSP
30.0000 g | Freq: Once | ORAL | Status: AC
  Administered 2015-08-24: 30 g via ORAL
  Filled 2015-08-24: qty 120

## 2015-08-24 MED ORDER — SODIUM CHLORIDE 0.9 % IJ SOLN
3.0000 mL | Freq: Two times a day (BID) | INTRAMUSCULAR | Status: DC
  Administered 2015-08-24: 3 mL via INTRAVENOUS

## 2015-08-24 NOTE — Progress Notes (Signed)
When RT came to give breathing tx to pt, O2 sat 70s, RT changed to venturi mask at 8LPM 40% sats above 90s. Pt in no acute distress. Schorr,NP made aware.

## 2015-08-24 NOTE — Progress Notes (Signed)
Physical Therapy Treatment Patient Details Name: Bill Walker MRN: [id number] DOB: Jun 08, 1928 Today's Date: 08/24/2015    History of Present Illness Bill Walker is a 81 y.o. male with Past medical history of COPD chronic respiratory failure on chronic oxygen, coronary artery disease, essential hypertension, chronic systolic CHF, dyslipidemia, chronic kidney disease. Admitted with SOB, progressive weakness, and loss of weight . Dx with acute on chronic CHF and respiratory failure    PT Comments    Patient in bed, agreeable to participate in PT today. Limited verbal engagement with therapy today, family reports confusion has not improved much. Patient was able to ambulate, transfer, and perform exercise as described below. No previous goals met due to recent decline, goals updated appropriately. Patient will benefit from continued PT to progress independence with transfers and safe ambulation.    Follow Up Recommendations  SNF;Supervision/Assistance - 24 hour     Equipment Recommendations  None recommended by PT    Recommendations for Other Services       Precautions / Restrictions Precautions Precautions: Fall Precaution Comments: incontinent,watch sats Restrictions Weight Bearing Restrictions: No    Mobility  Bed Mobility Overal bed mobility: Needs Assistance Bed Mobility: Supine to Sit     Supine to sit: Mod assist     General bed mobility comments: cues for sequence with use of rail, assist to pivot to EOB and elevate trunk with assist to bring legs off of bed  Transfers Overall transfer level: Needs assistance   Transfers: Sit to/from Stand Sit to Stand: Mod assist         General transfer comment: cues for sequence, hand placement and balance with mod assist for anterior translation and to rise from surface. Max assist to sit as pt with difficulty  following multmimodal commands  Ambulation/Gait Ambulation/Gait assistance: Mod assist;+2  safety/equipment Ambulation Distance (Feet): 10 Feet Assistive device: Rolling walker (2 wheeled) Gait Pattern/deviations: Step-through pattern;Decreased stride length;Trunk flexed;Wide base of support   Gait velocity interpretation: Below normal speed for age/gender General Gait Details: pt with trunk flexed, leaning left and mod assist for balance with max cues and chair to follow for safety. Pt fatigued quickly but unable to state, required verbal and tactile cues to stop walking and sit down.    Stairs            Wheelchair Mobility    Modified Rankin (Stroke Patients Only)       Balance Overall balance assessment: Needs assistance Sitting-balance support: Feet supported;Bilateral upper extremity supported Sitting balance-Leahy Scale: Poor Sitting balance - Comments: Trunk flexed. Required verbal and tactile cues to relax into chair.   Standing balance support: Bilateral upper extremity supported Standing balance-Leahy Scale: Poor Standing balance comment: Required use of RW for standing support. Trunk thoroughly flexed, VC's for upright posture.                    Cognition Arousal/Alertness: Awake/alert Behavior During Therapy: Flat affect Overall Cognitive Status: Impaired/Different from baseline Area of Impairment: Orientation;Attention;Safety/judgement;Problem solving Orientation Level: Disoriented to;Time;Place;Situation Current Attention Level: Sustained     Safety/Judgement: Decreased awareness of safety;Decreased awareness of deficits   Problem Solving: Slow processing;Decreased initiation;Difficulty sequencing General Comments: continued decline in cognition with difficulty following commands today    Exercises General Exercises - Upper Extremity Elbow Flexion: AROM;Both;15 reps;Seated Wrist Flexion: AROM;Both;10 reps;Seated Wrist Extension: AROM;Both;10 reps;Seated Composite Extension: AROM;Both;5 reps;Seated General Exercises - Lower  Extremity Long Arc Quad: AROM;Both;10 reps;Seated (Lacking full ROM.) Hip Flexion/Marching: AAROM;Both;10 reps;Seated  General Comments General comments (skin integrity, edema, etc.): Bil UE's swollen, pitting edema from SCD's noted throughout Bil LE.      Pertinent Vitals/Pain Pain Assessment: No/denies pain    Home Living                      Prior Function            PT Goals (current goals can now be found in the care plan section) Acute Rehab PT Goals PT Goal Formulation: With patient/family Time For Goal Achievement: 09/07/15 Progress towards PT goals: Goals downgraded-see care plan    Frequency  Min 3X/week    PT Plan Current plan remains appropriate    Co-evaluation             End of Session Equipment Utilized During Treatment: Gait belt;Oxygen (6L O2) Activity Tolerance: Patient limited by fatigue Patient left: in chair;with call bell/phone within reach;with chair alarm set;with family/visitor present     Time:  -     Charges:  $Therapeutic Activity: 8-22 mins                    G CodesRoanna Epley, SPT [phone] 08/24/2015, 12:16 PM

## 2015-08-24 NOTE — Progress Notes (Signed)
OT Cancellation Note  Patient Details Name: Bill Walker MRN: [id number] DOB: 04-17-1928   Cancelled Treatment:    Reason Eval/Treat Not Completed: Patient at procedure or test/ unavailable (Ultrasound). Will check back for OT treatment as time allows.   Markelle Asaro , MS, OTR/L, CLT Pager: [phone]  08/24/2015, 2:08 PM

## 2015-08-24 NOTE — Progress Notes (Signed)
Initial Nutrition Assessment  DOCUMENTATION CODES:   Not applicable  INTERVENTION:  Continue Ensure Enlive po BID, each supplement provides 350 kcal and 20 grams of protein Provide Magic cup ice cream with dinner, provides 290 kcal and 9 grams of protein   NUTRITION DIAGNOSIS:   Inadequate oral intake related to chronic illness as evidenced by per patient/family report.   GOAL:   Patient will meet greater than or equal to 90% of their needs   MONITOR:   PO intake, Labs, Weight trends, Skin, I & O's  REASON FOR ASSESSMENT:   Low [place]    ASSESSMENT:   81 y.o. male with Past medical history of COPD chronic respiratory failure on chronic oxygen, coronary artery disease, essential hypertension, chronic systolic CHF, dyslipidemia, chronic kidney disease.The patient was brought in by wife as he has been progressively declining over last few months  Pt lethargic and confused at time of visit; his wife at bedside assisted in providing history. Wife reports that patient has been eating very poorly but, better than PTA. PTA patient was drinking 1-2 Ensure supplements daily. Per nursing notes, pt is eating 50-75% of most meals, 0-25% of some. Wife reports some meals pt is only eating ice cream. Weight history shows that patient los from 207 lbs to 184 lbs from August to September- 11% weight loss. Pt's weight has trended back up the past 2 weeks.    Labs: high potassium, low chloride, high BUN/creatinine, low GFR, low hemoglobin  Diet Order:  Diet regular Room service appropriate?: Yes; Fluid consistency:: Thin  Skin:  Reviewed, no issues  Last BM:  10/11  Height:   Ht Readings from Last 1 Encounters:  08/10/15  (1.803 m)    Weight:   Wt Readings from Last 1 Encounters:  08/24/15 197 lb 12 oz (89.7 kg)    Ideal Body Weight:  78.2 kg  BMI:  Body mass index is 27.59 kg/(m^2).  Estimated Nutritional Needs:   Kcal:  2100-2300  Protein:  110-125 grams  Fluid:   2.1-2.3 L/day  EDUCATION NEEDS:   No education needs identified at this time  Dorothea Ogle RD, LDN Inpatient Clinical Dietitian Pager: [phone] After Hours Pager: [phone]

## 2015-08-24 NOTE — Progress Notes (Signed)
PROGRESS NOTE    Bill Walker TWS:[id number] DOB: 1928/04/29 DOA: 08/09/2015 PCP: Loura Pardon, MD  HPI/Brief narrative 81 y.o.WM with history of chronic respiratory failure on home oxygen 3 L/m, COPD, CAD, HTN, chronic systolic CHF, HLD, CKD was admitted on 07/20/2015 with the complaints of progressive decline over the last few months, loss of weight of 60 pounds, chronic productive cough and shortness of breath. At baseline patient uses a walker. Patient has been having gradual decline. Had 3 falls since June/2016. His hospitalization has been complicated by new onset paroxysmal A. fib with RVR, acute on chronic kidney disease which is limiting treatment of his decompensated CHF/possible cardiorenal syndrome. Cardiology continues to follow. Nephrology consulted and recommend palliative to be really consulted for input due to decline and poor prognosis-called palliative.   Assessment/Plan:  Acute on chronic respiratory failure with hypoxia and hypercapnia Combination of factors: Decompensated CHF, COPD exacerbation, pulmonary hypertension, possible cardio renal syndrome. Patient was initially diuresed: As the patient's creatinine was trending up Lasix was discontinued Treated underlying COPD exacerbation with DuoNeb nebs and Solu-Medrol. -Per wife is on 3 L O2 via [place] at home 24 hours a day - Acute respiratory failure resolved.  Community acquired pneumonia - Completed 10 days of antibiotics on 10/6. Treated  COPD exacerbation -Treated with oxygen, bronchodilators, IV Solu-Medrol and Brovana. Completed a course of antibiotics. Exacerbation seems to have resolved. Transitioned IV Solu-Medrol to oral prednisone taper.   Diptheroids in blood cx -Blood cultures positive Diphtheroids (Corynebacterium). -Most c/w contaminant - no further w/u at this time   Acute on chronic systolic CHF/pulmonary hypertension -Echo 7/16: LVEF 35-40 percent - Treated with IV Lasix but BUN/creatinine started  trending up and hence held.  - Intake output charting probably not accurate. -  has features of volume overload (2+ pitting leg edema and increased JVP and chest x-ray consistent with pulmonary edema)  - Starting back diuretics 10/10 (Lasix 20 mg IV 1 dose). Monitor. Cardiology following. Patient hyperkalemic and creatinine worsening. Nephrology consulted by cardiology.  Newly diagnosed paroxysmal A. fib with RVR -New onset this admission - Loaded with IV amiodarone on 08/15/15 which was converted to oral amiodarone on 10/6  - Cardiology following. From records, not on anticoagulants secondary to thrombocytopenia. Continued on home Plavix and aspirin  - Was in A. fib with RVR 10/8 AM but reverted to sinus rhythm.  - As per cardiology, continue amiodarone 400 MG twice a day until 10/13 then switch to 200 MG daily. Also on metoprolol 25 MG every 6 hours - Has reverted back to A. fib with RVR in the 100s on 10/11 AM.  Acute on stage III chronic kidney disease - Baseline creatinine may be in the 1.6-1.8 range - Creatinine worsened from 1.8 on 10/7 to 2.>2.5 and diuretics were held. May be cardiorenal syndrome.  -  Received a dose of Lasix IV 20 mg 1 on 10/10 and creatinine worsened to 2.9.  - Nephrology consulted and recommend palliative care input. He is not a candidate for dialysis.  Hyperkalemia - Secondary to worsening renal functions. Status post Kayexalate and high-dose Lasix started at 80 mg IV twice a day. Follow BMP.  Essential hypertension - Controlled  CAD native artery - Asymptomatic  - Continue aspirin 81 mg daily and Plavix 75 mg daily  BPH Continue Flomax.  Dyslipidemia - Continue 20 mg QHS  Protein calorie malnutrition/Anorexia -Continue Megace 800 mg Daily  Chronic anemia - Possibly of chronic disease. Stable  Thrombocytopenia - Unclear etiology  but stable  Leukocytosis - May be from steroids.   Adult failure to thrive/Plan of care Per previous records  patient's wife has been met by the palliative care as well as the primary physician's and expressed interest for DO NOT RESUSCITATE. However patient's wife wants to keep the patient on full code. Previous rounding [facility] MD's had long conversation with patient's 2 sons, wife and other family members explained about the patient's poor prognosis considering multiorgan failure, gradual decline. Currently patient is full code  Possible dementia  Anasarca - Multifactorial secondary to hypoalbuminemia, decompensated CHF, worsening renal insufficiency. Worsening renal function on diuresis. Lasix dose increased 10/11. Follow clinically.   DVT prophylaxis: SCD's  Code Status: Full Family Communication: None at bedside today. Disposition Plan: DC to SNF when medically stable    Consultants:  Cardiology  Palliative care medicine  Procedures:  None  Antibiotics:  Levaquin 9/27 >>stopped 9/28  Vanc 9/27 >> stopped 9/30  Primaxin 9/29>> stopped 10/6  Subjective: Denied complaints. Lethargic.  Objective: Filed Vitals:   08/24/15 0631 08/24/15 0743 08/24/15 0958 08/24/15 1134  BP: 121/57  103/59   Pulse: 74  102 93  Temp: 98.3 F (36.8 C)     TempSrc: Oral     Resp: 19     Height:      Weight: 89.7 kg (197 lb 12 oz)     SpO2: 94% 97%  95%    Intake/Output Summary (Last 24 hours) at 08/24/15 1516 Last data filed at 08/24/15 1200  Gross per 24 hour  Intake    537 ml  Output    200 ml  Net    337 ml   Filed Weights   08/22/15 0455 08/23/15 0424 08/24/15 0631  Weight: 91.7 kg (202 lb 2.6 oz) 87.1 kg (192 lb 0.3 oz) 89.7 kg (197 lb 12 oz)     Exam:  General exam: pleasant elderly male, chronically ill-looking lying comfortably supine in bed.  Respiratory system: Reduced breath sounds in the bases. Rest of lung fields clear to auscultation. No increased work of breathing. Cardiovascular system: S1 & S2 heard, RRR. No JVD, murmurs, gallops, clicks. 2+ pedal edema. Telemetry:  Has reverted back to A. fib with RVR in the low 100s on 10/11 at 7:40 AM.  Gastrointestinal system: Abdomen is nondistended, soft and nontender. Normal bowel sounds heard. Central nervous system: Alert and oriented to person and place . No focal neurological deficits. Extremities: Symmetric 5 x 5 power.   Data Reviewed: Basic Metabolic Panel:  Recent Labs Lab 08/19/15 0020 08/20/15 0855 08/21/15 0358 08/23/15 0334 08/24/15 0514  NA 136 140 141 138 137  K 3.7 4.1 4.0 4.9 6.0*  CL 83* 87* 86* 83* 84*  CO2 46* 47* 46* 47* 43*  GLUCOSE 128* 139* 104* 147* 116*  BUN 43* 55* 65* 86* 101*  CREATININE 1.54* 1.89* 2.24* 2.52* 2.90*  CALCIUM 7.9* 8.6* 8.6* 9.0 9.1  MG  --  1.7 1.9  --   --    Liver Function Tests:  Recent Labs Lab 08/20/15 0855 08/21/15 0358  AST 20 15  ALT 8* 6*  ALKPHOS 40 38  BILITOT 1.2 1.1  PROT 5.0* 4.7*  ALBUMIN 2.6* 2.4*   No results for input(s): LIPASE, AMYLASE in the last 168 hours. No results for input(s): AMMONIA in the last 168 hours. CBC:  Recent Labs Lab 08/18/15 0305 08/19/15 0020 08/20/15 0855 08/21/15 0358 08/23/15 0334  WBC 13.0* 12.3* 17.2* 12.7* 12.0*  NEUTROABS 11.2*  --  14.9*  --   --   HGB 11.8* 11.0* 12.0* 11.3* 10.2*  HCT 37.8* 36.0* 38.6* 35.6* 32.7*  MCV 109.2* 108.8* 107.8* 109.2* 107.6*  PLT 64* 65* 81* 70* 95*   Cardiac Enzymes: No results for input(s): CKTOTAL, CKMB, CKMBINDEX, TROPONINI in the last 168 hours. BNP (last 3 results)  Recent Labs  07/20/15 1724  PROBNP 856.0*   CBG:  Recent Labs Lab 08/23/15 1712  GLUCAP 134*    No results found for this or any previous visit (from the past 240 hour(s)).         Studies: Dg Chest 2 View  08/23/2015   CLINICAL DATA:  Dyspnea, hypoxia.  EXAM: CHEST  2 VIEW  COMPARISON:  August 15, 2015.  FINDINGS: Stable cardiomegaly with central pulmonary vascular congestion is noted. Stable bilateral pleural effusions are noted. Stable interstitial densities are  noted throughout both lungs most consistent with edema or possibly pneumonia. Bony thorax is unremarkable.  IMPRESSION: Stable cardiomegaly and probable bilateral pulmonary edema with associated pleural effusions.   Electronically Signed   By: Marijo Conception, M.D.   On: 08/23/2015 11:59   US Renal  08/24/2015   CLINICAL DATA:  Acute renal failure  EXAM: RENAL / URINARY TRACT ULTRASOUND COMPLETE  COMPARISON:  None.  FINDINGS: Right Kidney:  Length: 11.1 cm. Echogenicity within normal limits. There is mild cortical thinning. No hydronephrosis visualized. There is a 0.9 x 0.9 x 1 cm cyst.  Left Kidney:  Length: 11.1 cm. Echogenicity within normal limits. There is mild cortical thinning. No mass or hydronephrosis visualized.  Bladder:  Appears normal for degree of bladder distention.  IMPRESSION: Mild cortical renal thinning bilaterally. Small cyst in the right kidney.   Electronically Signed   By: Abelardo Diesel M.D.   On: 08/24/2015 14:59        Scheduled Meds: . amiodarone  400 mg Oral BID  . arformoterol  15 mcg Nebulization BID  . aspirin  81 mg Oral Daily  . budesonide (PULMICORT) nebulizer solution  0.25 mg Nebulization BID  . clopidogrel  75 mg Oral Daily  . dextromethorphan-guaiFENesin  1 tablet Oral BID  . feeding supplement (ENSURE ENLIVE)  237 mL Oral BID BM  . furosemide  80 mg Intravenous BID  . megestrol  800 mg Oral Daily  . metoprolol tartrate  25 mg Oral Q6H  . predniSONE  40 mg Oral Q breakfast  . simvastatin  20 mg Oral QHS  . sodium chloride  3 mL Intravenous Q12H   Continuous Infusions:    Principal Problem:   Acute on chronic respiratory failure with hypoxia (HCC) Active Problems:   Essential hypertension   Coronary atherosclerosis   Chronic respiratory failure with hypercapnia (HCC)   BPH (benign prostatic hyperplasia)   COPD (chronic obstructive pulmonary disease) (HCC)   Acute on chronic systolic CHF (congestive heart failure) (HCC)   Dyslipidemia    Anorexia   Loss of weight   CAP (community acquired pneumonia)   Palliative care encounter   DNR (do not resuscitate) discussion   Pulmonary hypertension ([place])   Acute pulmonary edema (HCC)   CAD in native artery   Protein-calorie malnutrition ([place])   COPD exacerbation ([place])   Atrial fibrillation with RVR (HCC)   Systolic congestive heart failure (HCC)   Paroxysmal atrial fibrillation ([place])    Time spent:  30 minutes  Sarrinah Gardin, MD, FACP, FHM. [facility] Pager [phone]  If 7PM-7AM, please contact night-coverage [email or web address] Password [email or web address] 08/24/2015, 3:16  PM    LOS: 14 days

## 2015-08-24 NOTE — Consult Note (Signed)
Reason for Consult:  AKI/CKD Referring Physician: Waymon Amato, MD  Bill Walker is an 81 y.o. male.  HPI: Pt is an 81yo WM with PMH sig for CAD, ischemic cardiomyopathy, chronic systolic CHF, COPD on chronic O2, and CKD stage 3-4 (baseline Scr 1.7-2.1) who was admitted on 08/09/15 with increasing lower extremity edema, SOB, anorexia, weight loss, FTT, and decompensated CHF and was started on IV diuretics with resultant increase in BUN/Cr over the last 4 days.  We have been consulted to further evaluate and manage his AKI/CKD.  He sees Dr. Allena Katz in our office but has missed several appointments related to recurrent hospitalizations for decompensated CHF and CAD.  The trend in Scr is seen below.  Palliative care has been consulted and last saw patient on 08/15/15 with his wife.  He has continued to decline physically and clinically and now appears to have cardiorenal syndrome.  Pt is aware that dialysis is not possible/appropriate.  He was made a DNR earlier in this admission.  Trend in Creatinine:  CREATININE, SER  Date/Time Value Ref Range Status  08/24/2015 05:14 AM 2.90* 0.61 - 1.24 mg/dL Final  40/98/1191 47:82 AM 2.52* 0.61 - 1.24 mg/dL Final  95/62/1308 65:78 AM 2.24* 0.61 - 1.24 mg/dL Final  46/96/2952 84:13 AM 1.89* 0.61 - 1.24 mg/dL Final  24/40/1027 25:36 AM 1.54* 0.61 - 1.24 mg/dL Final  64/40/3474 25:95 AM 1.48* 0.61 - 1.24 mg/dL Final  63/87/5643 32:95 AM 1.65* 0.61 - 1.24 mg/dL Final  18/84/1660 63:01 AM 1.84* 0.61 - 1.24 mg/dL Final  60/08/9322 55:73 AM 2.13* 0.61 - 1.24 mg/dL Final  22/12/5425 06:23 AM 2.25* 0.61 - 1.24 mg/dL Final  76/28/3151 76:16 AM 1.99* 0.61 - 1.24 mg/dL Final  07/37/1062 69:48 AM 1.89* 0.61 - 1.24 mg/dL Final  54/62/7035 00:93 AM 1.71* 0.61 - 1.24 mg/dL Final  81/82/9937 16:96 AM 1.60* 0.61 - 1.24 mg/dL Final  78/93/8101 75:10 PM 1.28 0.40 - 1.50 mg/dL Final  25/85/2778 24:23 PM 1.85* 0.40 - 1.50 mg/dL Final  53/61/4431 54:00 AM 1.85* 0.61 - 1.24 mg/dL  Final  86/76/1950 93:26 AM 2.06* 0.61 - 1.24 mg/dL Final  71/24/5809 98:33 AM 2.16* 0.61 - 1.24 mg/dL Final  82/50/5397 67:34 AM 1.96* 0.61 - 1.24 mg/dL Final  19/37/9024 09:73 AM 1.71* 0.61 - 1.24 mg/dL Final  53/29/9242 68:34 AM 1.60* 0.61 - 1.24 mg/dL Final  19/62/2297 98:92 AM 1.83* 0.61 - 1.24 mg/dL Final  11/94/1740 81:44 PM 1.43* 0.61 - 1.24 mg/dL Final  81/85/6314 97:02 AM 1.40 0.40 - 1.50 mg/dL Final  63/78/5885 02:77 AM 1.5 0.4 - 1.5 mg/dL Final  41/28/7867 67:20 AM 1.7* 0.4 - 1.5 mg/dL Final  94/70/9628 36:62 PM 1.7* 0.4 - 1.5 mg/dL Final  94/76/5465 03:54 AM 1.7* 0.4 - 1.5 mg/dL Final  65/68/1275 17:00 AM 1.6* 0.4 - 1.5 mg/dL Final  17/49/4496 75:91 AM 1.41 0.4 - 1.5 mg/dL Final  63/84/6659 93:57 AM 1.29 0.4 - 1.5 mg/dL Final  01/77/9390 30:09 AM 1.24 0.4 - 1.5 mg/dL Final  23/30/0762 26:33 AM 1.45 0.4 - 1.5 mg/dL Final  35/45/6256 38:93 AM 1.57* 0.4 - 1.5 mg/dL Final  73/42/8768 11:57 AM 1.65* 0.4 - 1.5 mg/dL Final  26/20/3559 74:16 AM 1.70* 0.4 - 1.5 mg/dL Final  38/45/3646 80:32 AM 2.00* 0.4 - 1.5 mg/dL Final  11/05/8249 03:70 PM 2.13* 0.4 - 1.5 mg/dL Final  48/88/9169 45:03 AM 2.25* 0.4 - 1.5 mg/dL Final  88/82/8003 49:17 AM 2.39* 0.4 - 1.5 mg/dL Final  91/50/5697 94:80 PM 2.28*  0.4 - 1.5 mg/dL Final  16/08/9603 54:09 AM 2.30* 0.4 - 1.5 mg/dL Final  81/19/1478 29:56 AM 2.20* 0.4 - 1.5 mg/dL Final  21/30/8657 84:69 AM 3.10* 0.4 - 1.5 mg/dL Final  62/95/2841 32:44 PM 4.59* 0.4 - 1.5 mg/dL Final  11/15/7251 66:44 PM 1.75* 0.40 - 1.50 mg/dL Final  03/47/4259 56:38 AM 1.4 0.4-1.5 mg/dL Final  75/64/3329 51:88 AM 1.43 0.4 - 1.5 mg/dL Final  41/66/0630 16:01 PM 1.38 0.4 - 1.5 mg/dL Final  09/32/3557 32:20 AM 1.4 0.4 - 1.5 mg/dL Final  25/42/7062 37:62 PM 1.6* 0.4-1.5 mg/dL Final    PMH:   Past Medical History  Diagnosis Date  . Ischemic cardiomyopathy     a. 02/2005 EF 32% (myoview report).  . Hypertension   . Coronary artery disease     a. s/p prior MI w/ RCA  stenting;  b. 03/2011 Cath: LM 20d, LAD 40p/100p, LCX 40p, L->L collats to distal LAD, RCA dominant, patent prox stent, 66m ISR, 50-46m, 50-60d, 60d-->Med Rx;  c. 04/2015 NSTEMI/Cath: LM 45, LAD 80/1m, 100d (L->L collats), D2 99ost, OM1 60, RCA patent stent prox, 63m, 70d, RPAV1/2 70->Med Rx.  Marland Kitchen Hyperlipidemia   . Vitamin B12 deficiency   . History of pneumonia   . Thrombocytopenia   . Cor pulmonale   . Hearing loss   . Alcohol abuse, unspecified   . Tobacco use disorder   . CKD (chronic kidney disease), stage III   . Osteoarthritis   . Gout   . Diverticulosis of colon   . COPD (chronic obstructive pulmonary disease)   . Anemia   . Bronchiectasis     CT Chest 07/07/09 bilateral lower lobe medial bronchiectasis  . Chronic systolic CHF (congestive heart failure)     a. 02/2005 EF 32% (myoview report).    PSH:   Past Surgical History  Procedure Laterality Date  . Cardiac catheterization      (catheterization 2006 demonstrated an   occluded LAD.  There is a proximal stent in the RCA which was   patent.  He had distal 75% and 95% RCA lesions.  Initially it was  thought that he would have PCI but he was managed medically).  . Mri      Lumbar disk disease 06/1999  . Esophagogastroduodenoscopy      gastritis 12/1999  . Cardiac catheterization N/A 05/04/2015    Procedure: Left Heart Cath and Coronary Angiography;  Surgeon: Lennette Bihari, MD; LAD under percent, D2 99%, CFX patent, OM1 60% RCA 70%, previous stent is patent, no LV gram, 100 mL dye used    Allergies:  Allergies  Allergen Reactions  . Doxazosin Mesylate Rash    rash  . Penicillins Other (See Comments)    REACTION: unknown reaction    Medications:   Prior to Admission medications   Medication Sig Start Date End Date Taking? Authorizing Provider  amLODipine (NORVASC) 5 MG tablet Take 5 mg by mouth daily. 07/02/15  Yes Historical Provider, MD  aspirin 81 MG tablet Take 1 tablet (81 mg total) by mouth daily. 05/09/15  Yes  Ok Anis, NP  calcium carbonate (TUMS - DOSED IN MG ELEMENTAL CALCIUM) 500 MG chewable tablet Chew 1 tablet by mouth daily.   Yes Historical Provider, MD  cholecalciferol (VITAMIN D) 1000 UNITS tablet Take 1 tablet (1,000 Units total) by mouth daily. 04/01/13  Yes Wendall Stade, MD  clopidogrel (PLAVIX) 75 MG tablet TAKE ONE TABLET BY MOUTH ONCE DAILY 07/15/15  Yes Idamae Schuller  A Tower, MD  diphenhydrAMINE (SOMINEX) 25 MG tablet Take 50 mg by mouth at bedtime as needed for sleep.    Yes Historical Provider, MD  furosemide (LASIX) 80 MG tablet TAKE 1 TABLET BY MOUTH DAILY & TAKE 1/2 TAB BY MOUTH IN THE EVENINGS ON Monday, Wed & Friday ONLY.   Yes Historical Provider, MD  isosorbide mononitrate (IMDUR) 30 MG 24 hr tablet TAKE TWO TABLETS BY MOUTH ONCE DAILY 06/21/15  Yes Judy Pimple, MD  metoprolol succinate (TOPROL-XL) 100 MG 24 hr tablet Take 1 tablet (100 mg total) by mouth daily. Take with or immediately following a meal. 07/14/15  Yes Wendall Stade, MD  nitroGLYCERIN (NITROSTAT) 0.4 MG SL tablet Place 0.4 mg under the tongue every 5 (five) minutes as needed for chest pain (up to 3 doses).   Yes Historical Provider, MD  ranolazine (RANEXA) 500 MG 12 hr tablet Take 1 tablet (500 mg total) by mouth 2 (two) times daily. 05/09/15  Yes Ok Anis, NP  simvastatin (ZOCOR) 20 MG tablet TAKE ONE TABLET BY MOUTH AT BEDTIME 07/05/15  Yes Judy Pimple, MD  tamsulosin (FLOMAX) 0.4 MG CAPS capsule Take 1 capsule (0.4 mg total) by mouth daily. 04/27/15  Yes Judy Pimple, MD  tiotropium (SPIRIVA HANDIHALER) 18 MCG inhalation capsule INHALE ONE CAPSULE BY MOUTH ONCE DAILY 02/08/15  Yes Judy Pimple, MD  vitamin B-12 (CYANOCOBALAMIN) 1000 MCG tablet Take 1,000 mcg by mouth daily.   Yes Historical Provider, MD  NON FORMULARY Oxygen- 3 lpm 24/7    Historical Provider, MD    Inpatient medications: . amiodarone  400 mg Oral BID  . arformoterol  15 mcg Nebulization BID  . aspirin  81 mg Oral Daily  .  budesonide (PULMICORT) nebulizer solution  0.25 mg Nebulization BID  . clopidogrel  75 mg Oral Daily  . dextromethorphan-guaiFENesin  1 tablet Oral BID  . feeding supplement (ENSURE ENLIVE)  237 mL Oral BID BM  . megestrol  800 mg Oral Daily  . metoprolol tartrate  25 mg Oral Q6H  . predniSONE  40 mg Oral Q breakfast  . simvastatin  20 mg Oral QHS  . sodium chloride  3 mL Intravenous Q12H    Discontinued Meds:   Medications Discontinued During This Encounter  Medication Reason  . amLODipine (NORVASC) tablet 5 mg   . levofloxacin (LEVAQUIN) IVPB 750 mg   . furosemide (LASIX) injection 60 mg   . methylPREDNISolone sodium succinate (SOLU-MEDROL) 125 mg/2 mL injection 60 mg   . furosemide (LASIX) injection 60 mg   . ipratropium-albuterol (DUONEB) 0.5-2.5 (3) MG/3ML nebulizer solution 3 mL   . heparin injection 5,000 Units   . 0.9 %  sodium chloride infusion   . albuterol (PROVENTIL) (2.5 MG/3ML) 0.083% nebulizer solution 2.5 mg   . ipratropium-albuterol (DUONEB) 0.5-2.5 (3) MG/3ML nebulizer solution 3 mL   . levofloxacin (LEVAQUIN) IVPB 750 mg   . metoprolol succinate (TOPROL-XL) 24 hr tablet 100 mg   . isosorbide mononitrate (IMDUR) 24 hr tablet 60 mg   . vancomycin (VANCOCIN) 1,250 mg in sodium chloride 0.9 % 250 mL IVPB   . diltiazem (CARDIZEM) 100 mg in dextrose 5 % 100 mL (1 mg/mL) infusion   . metoprolol (LOPRESSOR) injection 10 mg   . albuterol (PROVENTIL) (2.5 MG/3ML) 0.083% nebulizer solution 2.5 mg   . ipratropium-albuterol (DUONEB) 0.5-2.5 (3) MG/3ML nebulizer solution 3 mL   . methylPREDNISolone sodium succinate (SOLU-MEDROL) 125 mg/2 mL injection 60 mg   .  ranolazine (RANEXA) 12 hr tablet 500 mg   . furosemide (LASIX) injection 40 mg   . metoprolol (LOPRESSOR) injection 15 mg   . hydrALAZINE (APRESOLINE) injection 5 mg   . simvastatin (ZOCOR) tablet 20 mg   . tamsulosin (FLOMAX) capsule 0.4 mg   . metoprolol (LOPRESSOR) injection 20 mg   . levalbuterol (XOPENEX)  nebulizer solution 0.63 mg   . ipratropium (ATROVENT) nebulizer solution 0.5 mg   . dextromethorphan-guaiFENesin (MUCINEX DM) 30-600 MG per 12 hr tablet 1 tablet   . metoprolol (LOPRESSOR) injection 10 mg   . amiodarone (NEXTERONE PREMIX) 360 MG/200ML (1.8 mg/mL) IV infusion   . metoprolol (LOPRESSOR) injection 15 mg   . ipratropium (ATROVENT) nebulizer solution 0.5 mg   . levalbuterol (XOPENEX) nebulizer solution 0.63 mg   . levalbuterol (XOPENEX) nebulizer solution 0.63 mg   . metoprolol (LOPRESSOR) injection 10 mg   . furosemide (LASIX) injection 20 mg   . metoprolol (LOPRESSOR) injection 5 mg Completed Course  . metoprolol tartrate (LOPRESSOR) tablet 25 mg   . ipratropium (ATROVENT) nebulizer solution 0.5 mg   . 0.9 %  sodium chloride infusion   . methylPREDNISolone sodium succinate (SOLU-MEDROL) 125 mg/2 mL injection 60 mg   . acetaminophen (TYLENOL) suppository 650 mg   . ondansetron (ZOFRAN) injection 4 mg     Social History:  reports that he quit smoking about 18 years ago. He does not have any smokeless tobacco history on file. He reports that he does not drink alcohol or use illicit drugs.  Family History:   Family History  Problem Relation Age of Onset  . Lymphoma Brother   . Prostate cancer Brother     Pertinent items are noted in HPI. Weight change: 2.6 kg (5 lb 11.7 oz)  Intake/Output Summary (Last 24 hours) at 08/24/15 1240 Last data filed at 08/24/15 1200  Gross per 24 hour  Intake    777 ml  Output    200 ml  Net    577 ml   BP 103/59 mmHg  Pulse 93  Temp(Src) 98.3 F (36.8 C) (Oral)  Resp 19  Ht 5\' 11"  (1.803 m)  Wt 89.7 kg (197 lb 12 oz)  BMI 27.59 kg/m2  SpO2 95% Filed Vitals:   08/24/15 0631 08/24/15 0743 08/24/15 0958 08/24/15 1134  BP: 121/57  103/59   Pulse: 74  102 93  Temp: 98.3 F (36.8 C)     TempSrc: Oral     Resp: 19     Height:      Weight: 89.7 kg (197 lb 12 oz)     SpO2: 94% 97%  95%     General appearance: cachectic,  fatigued, mild distress, pale and tachypnic Head: Normocephalic, without obvious abnormality, atraumatic Resp: diminished breath sounds bibasilar and rales bilaterally Cardio: regular rate and rhythm and no rub GI: soft, non-tender; bowel sounds normal; no masses,  no organomegaly Extremities: edema 1+  Labs: Basic Metabolic Panel:  Recent Labs Lab 08/19/15 0020 08/20/15 0855 08/21/15 0358 08/23/15 0334 08/24/15 0514  NA 136 140 141 138 137  K 3.7 4.1 4.0 4.9 6.0*  CL 83* 87* 86* 83* 84*  CO2 46* 47* 46* 47* 43*  GLUCOSE 128* 139* 104* 147* 116*  BUN 43* 55* 65* 86* 101*  CREATININE 1.54* 1.89* 2.24* 2.52* 2.90*  ALBUMIN  --  2.6* 2.4*  --   --   CALCIUM 7.9* 8.6* 8.6* 9.0 9.1   Liver Function Tests:  Recent Labs Lab 08/20/15 0855  08/21/15 0358  AST 20 15  ALT 8* 6*  ALKPHOS 40 38  BILITOT 1.2 1.1  PROT 5.0* 4.7*  ALBUMIN 2.6* 2.4*   No results for input(s): LIPASE, AMYLASE in the last 168 hours. No results for input(s): AMMONIA in the last 168 hours. CBC:  Recent Labs Lab 08/18/15 0305 08/19/15 0020 08/20/15 0855 08/21/15 0358 08/23/15 0334  WBC 13.0* 12.3* 17.2* 12.7* 12.0*  NEUTROABS 11.2*  --  14.9*  --   --   HGB 11.8* 11.0* 12.0* 11.3* 10.2*  HCT 37.8* 36.0* 38.6* 35.6* 32.7*  MCV 109.2* 108.8* 107.8* 109.2* 107.6*  PLT 64* 65* 81* 70* 95*   PT/INR: @LABRCNTIP (inr:5) Cardiac Enzymes: )No results for input(s): CKTOTAL, CKMB, CKMBINDEX, TROPONINI in the last 168 hours. CBG:  Recent Labs Lab 08/23/15 1712  GLUCAP 134*    Iron Studies: No results for input(s): IRON, TIBC, TRANSFERRIN, FERRITIN in the last 168 hours.  Xrays/Other Studies: Dg Chest 2 View  08/23/2015   CLINICAL DATA:  Dyspnea, hypoxia.  EXAM: CHEST  2 VIEW  COMPARISON:  August 15, 2015.  FINDINGS: Stable cardiomegaly with central pulmonary vascular congestion is noted. Stable bilateral pleural effusions are noted. Stable interstitial densities are noted throughout both lungs  most consistent with edema or possibly pneumonia. Bony thorax is unremarkable.  IMPRESSION: Stable cardiomegaly and probable bilateral pulmonary edema with associated pleural effusions.   Electronically Signed   By: Lupita Raider, M.D.   On: 08/23/2015 11:59     Assessment/Plan: 1.  AKI/CKD with hyperkalemia- in setting of decompensated CHF and increased diuretic use.  Not candidate for dialysis.  Continue with conservative therapy, continued diuresis for patient comfort.  Ask palliative care to come back and discuss clinical decline with family and consider moving towards comfort care. 2. Hyperkalemia- treat with kayexalate and lasix.  Follow k levels 3. Acute on Chronic CHF due to ischemic cardiomyopathy- increase diuretics for patient comfort 4. CAD 5. FTT- declining overall condition with multiple end-stage disease processes. 6. Protein/calorie malnutrition 7. Disposition- overall prognosis is poor.  Recommend transitioning to comfort measures.  Please contact Palliative care of the recent decline in clinical status to meet with wife/family.   Sahana Boyland A 08/24/2015, 12:40 PM

## 2015-08-24 NOTE — Progress Notes (Addendum)
Subjective: He denies abd pain but says his lower back hurts.    Objective: Vital signs in last 24 hours: Temp:  [98.3 F (36.8 C)-98.6 F (37 C)] 98.3 F (36.8 C) (10/11 0631) Pulse Rate:  [65-102] 93 (10/11 1134) Resp:  [18-19] 19 (10/11 0631) BP: (103-122)/(54-59) 103/59 mmHg (10/11 0958) SpO2:  [94 %-98 %] 95 % (10/11 1134) FiO2 (%):  [40 %] 40 % (10/11 0743) Weight:  [197 lb 12 oz (89.7 kg)] 197 lb 12 oz (89.7 kg) (10/11 0631) Last BM Date: 08/24/15  Intake/Output from previous day: 10/10 0701 - 10/11 0700 In: 780 [P.O.:780] Out: 200 [Urine:200] Intake/Output this shift: Total I/O In: 237 [P.O.:237] Out: -   Medications Scheduled Meds: . amiodarone  400 mg Oral BID  . arformoterol  15 mcg Nebulization BID  . aspirin  81 mg Oral Daily  . budesonide (PULMICORT) nebulizer solution  0.25 mg Nebulization BID  . clopidogrel  75 mg Oral Daily  . dextromethorphan-guaiFENesin  1 tablet Oral BID  . feeding supplement (ENSURE ENLIVE)  237 mL Oral BID BM  . megestrol  800 mg Oral Daily  . metoprolol tartrate  25 mg Oral Q6H  . predniSONE  40 mg Oral Q breakfast  . simvastatin  20 mg Oral QHS  . sodium chloride  3 mL Intravenous Q12H   Continuous Infusions:  PRN Meds:.acetaminophen **OR** [DISCONTINUED] acetaminophen, levalbuterol, ondansetron **OR** [DISCONTINUED] ondansetron (ZOFRAN) IV  PE: General appearance: alert, cooperative and no distress Lungs: clear to auscultation bilaterally Heart: irregularly irregular rhythm Abdomen: +BS, soft, nontender Extremities: 3+ anasarca Pulses: 2+ and symmetric Skin: Warm and dry Neurologic: Grossly normal  Lab Results:   Recent Labs  08/23/15 0334  WBC 12.0*  HGB 10.2*  HCT 32.7*  PLT 95*   BMET  Recent Labs  08/23/15 0334 08/24/15 0514  NA 138 137  K 4.9 6.0*  CL 83* 84*  CO2 47* 43*  GLUCOSE 147* 116*  BUN 86* 101*  CREATININE 2.52* 2.90*  CALCIUM 9.0 9.1     Assessment/Plan  Principal  Problem:   Acute on chronic respiratory failure with hypoxia (HCC) Active Problems:   Essential hypertension   Coronary atherosclerosis   Chronic respiratory failure with hypercapnia (HCC)   BPH (benign prostatic hyperplasia)   COPD (chronic obstructive pulmonary disease) (HCC)   Acute on chronic systolic CHF (congestive heart failure) (HCC)   Dyslipidemia   Anorexia   Loss of weight   CAP (community acquired pneumonia)   Palliative care encounter   DNR (do not resuscitate) discussion   Pulmonary hypertension (HCC)   Acute pulmonary edema (HCC)   CAD in native artery   Protein-calorie malnutrition (HCC)   COPD exacerbation (HCC)   Atrial fibrillation with RVR (HCC)   Systolic congestive heart failure (HCC)   Paroxysmal atrial fibrillation (HCC)  1. Acute on chronic systolic HF Net fluids: +0.6L/+1.2L. UOP only <[id number]n10Toll <MEASUREMENTS[id number]Lin6Tol<MEASUREMENT[place]16109cIllinoisIndi32anaLin1Tol<MEASUREMENT[place]16109c91IllinoisIndi48anai2(Lin7Tol<MEASUREMENT[place]16109c(838PIllinoisIndi50anar5Lin8Tol<MEASUREMENT[place]16109IllinoisIndi41ana83Lin2Tol<MEASUREMENT[place]16109c31ThIllinoisIndi60anamLin3Toll<MEASUREMENT[place]16109cIllinoisIndi12ana83Lin3Tol<MEASUREMENT[place]16109c3IllinoisIndi74anaLin6Tol<MEASUREMENT[place]16109cIllinoisIndi25ana6Lin8Tol<MEASUREMENTByron16109c3IllinoisIndi77anLin3Toll <MEASUREMENT[place]16109c(57QuIllinoisIndi82anaeLin8Tol<MEASUREMENT[place]16109cIllinoisIndi73ana3Lin4Toll<MEASUREMENT[place]16109c(20PopponesseIllinoisIndi73ana Lin1Tol<MEASUREMENT[place]16109IllinoisIndi15ana5Lin3Toll 629Charm Ringsght is stable, however, he has 2+ pitting anasarca. Coarse crackles on the right.CXR yesterday showed bilateral pulmonary edema with associated pleural effusions.  One dose IV lasix given.  SCr continues to increase; 2.24>>2.52>>2.9.  Potassium 6.0. Uptrending Cr and BUN would suggests he is becoming hypovolemic but he appears overloaded on exam. Anasarca from low albumin?    2. COPD exacerbation - management per primary team  3. Afib - new onset this admission. he was in NSR up until ~0800hrs.  Now in flutter.  - Continue amio 400mg  bid as well as metoprolol   bid. - he was loaded with IV amio 08/15/15, converted to oral 10/6.  - from notes has not been on anticoag due to low platelets, he has been continued on his home plavix and ASA.  - with COPD, systolic HF, and renal disease difficult to select ideal agents. His severe SOB and hypoxia create signicant demand for tachycardia as well.  Lopressor increased to  q 6 hours yesterday. No changes to meds.   4.   Hyperkalemia Kayexalate given.  5.  Acute on Chronic kidney disease SCr  continues to worsen 2.24>>2.52>>2.9.  UOP is charted at .  May be intravascularly dry.  ? Prerenal from CHF.  EF 20-25%.  Anasarca is present and worsening.  Low albumin.  Will check renal US, urine osmolality and bladder scan.  I spoke with Dr. Arrie Aran for consult.  Will start IV lasix 80bid.     6. Hypoalbuminemia    LOS: 14 days    HAGER, BRYAN PA-C 08/24/2015 11:55 AM   Agree with note written by Jones Skene PAC  Pt with clear vol overload. SCr steadily increasing with diuresis. I have spoken with Dr. Shirlee Latch and we both agree that he would benefit from a Right heart cath to determine volume status, C.I. And possibility of short course inotropic Rx. He appears to be back in afib today.  Nanetta Batty 08/24/2015 1:30 PM

## 2015-08-25 ENCOUNTER — Encounter (HOSPITAL_COMMUNITY)
Admission: EM | Disposition: A | Discharge status: Hospice | Payer: Self-pay | Source: Home / Self Care | Attending: Internal Medicine

## 2015-08-25 DIAGNOSIS — Z515 Encounter for palliative care: Secondary | ICD-10-CM | POA: Insufficient documentation

## 2015-08-25 DIAGNOSIS — R601 Generalized edema: Secondary | ICD-10-CM

## 2015-08-25 DIAGNOSIS — J441 Chronic obstructive pulmonary disease with (acute) exacerbation: Secondary | ICD-10-CM

## 2015-08-25 DIAGNOSIS — N179 Acute kidney failure, unspecified: Secondary | ICD-10-CM

## 2015-08-25 DIAGNOSIS — N183 Chronic kidney disease, stage 3 (moderate): Secondary | ICD-10-CM

## 2015-08-25 LAB — RENAL FUNCTION PANEL
ANION GAP: 11 (ref 5–15)
Albumin: 2.9 g/dL — ABNORMAL LOW (ref 3.5–5.0)
BUN: 96 mg/dL — ABNORMAL HIGH (ref 6–20)
CALCIUM: 8.8 mg/dL — AB (ref 8.9–10.3)
CO2: 45 mmol/L — AB (ref 22–32)
Chloride: 81 mmol/L — ABNORMAL LOW (ref 101–111)
Creatinine, Ser: 2.94 mg/dL — ABNORMAL HIGH (ref 0.61–1.24)
GFR calc Af Amer: 21 mL/min — ABNORMAL LOW (ref >60)
GFR calc non Af Amer: 18 mL/min — ABNORMAL LOW (ref >60)
GLUCOSE: 130 mg/dL — AB (ref 65–99)
Phosphorus: 4 mg/dL (ref 2.5–4.6)
Potassium: 5 mmol/L (ref 3.5–5.1)
SODIUM: 137 mmol/L (ref 135–145)

## 2015-08-25 LAB — CBC
HCT: 36 % — ABNORMAL LOW (ref 39.0–52.0)
HEMOGLOBIN: 11.2 g/dL — AB (ref 13.0–17.0)
MCH: 33.7 pg (ref 26.0–34.0)
MCHC: 31.1 g/dL (ref 30.0–36.0)
MCV: 108.4 fL — AB (ref 78.0–100.0)
Platelets: 122 10*3/uL — ABNORMAL LOW (ref 150–400)
RBC: 3.32 MIL/uL — ABNORMAL LOW (ref 4.22–5.81)
RDW: 13.9 % (ref 11.5–15.5)
WBC: 13.6 10*3/uL — ABNORMAL HIGH (ref 4.0–10.5)

## 2015-08-25 LAB — PROTIME-INR
INR: 1.1 (ref 0.00–1.49)
PROTHROMBIN TIME: 14.4 s (ref 11.6–15.2)

## 2015-08-25 SURGERY — RIGHT HEART CATH

## 2015-08-25 MED ORDER — ACETAMINOPHEN 325 MG PO TABS: 650.0000 mg | ORAL_TABLET | Freq: Four times a day (QID) | ORAL | Status: DC | PRN

## 2015-08-25 MED ORDER — ONDANSETRON 4 MG PO TBDP
4.0000 mg | ORAL_TABLET | Freq: Four times a day (QID) | ORAL | Status: DC | PRN
  Filled 2015-08-25: qty 1

## 2015-08-25 MED ORDER — FUROSEMIDE 10 MG/ML IJ SOLN
80.0000 mg | INTRAMUSCULAR | Status: AC
Start: 2015-08-25 — End: 2015-08-25
  Administered 2015-08-25: 80 mg via INTRAVENOUS
  Filled 2015-08-25: qty 8

## 2015-08-25 MED ORDER — BIOTENE DRY MOUTH MT LIQD: 15.0000 mL | OROMUCOSAL | Status: DC | PRN

## 2015-08-25 MED ORDER — ACETAMINOPHEN 650 MG RE SUPP: 650.0000 mg | Freq: Four times a day (QID) | RECTAL | Status: DC | PRN

## 2015-08-25 MED ORDER — LORAZEPAM 2 MG/ML PO CONC: 1.0000 mg | ORAL | Status: DC | PRN

## 2015-08-25 MED ORDER — GLYCOPYRROLATE 0.2 MG/ML IJ SOLN
0.2000 mg | INTRAMUSCULAR | Status: DC | PRN
  Filled 2015-08-25: qty 1

## 2015-08-25 MED ORDER — HYDROMORPHONE HCL 1 MG/ML IJ SOLN: 0.5000 mg | INTRAMUSCULAR | Status: DC | PRN

## 2015-08-25 MED ORDER — HALOPERIDOL LACTATE 2 MG/ML PO CONC
0.5000 mg | ORAL | Status: DC | PRN
  Filled 2015-08-25: qty 0.3

## 2015-08-25 MED ORDER — HALOPERIDOL 0.5 MG PO TABS
0.5000 mg | ORAL_TABLET | ORAL | Status: DC | PRN
  Filled 2015-08-25: qty 1

## 2015-08-25 MED ORDER — HALOPERIDOL LACTATE 5 MG/ML IJ SOLN: 0.5000 mg | INTRAMUSCULAR | Status: DC | PRN

## 2015-08-25 MED ORDER — FUROSEMIDE 10 MG/ML IJ SOLN: 80.0000 mg | Freq: Two times a day (BID) | INTRAMUSCULAR | Status: DC

## 2015-08-25 MED ORDER — GLYCOPYRROLATE 1 MG PO TABS
1.0000 mg | ORAL_TABLET | ORAL | Status: DC | PRN
  Filled 2015-08-25: qty 1

## 2015-08-25 MED ORDER — POLYVINYL ALCOHOL 1.4 % OP SOLN
1.0000 [drp] | Freq: Four times a day (QID) | OPHTHALMIC | Status: DC | PRN
  Filled 2015-08-25: qty 15

## 2015-08-25 MED ORDER — METOLAZONE 2.5 MG PO TABS: 2.5000 mg | ORAL_TABLET | Freq: Once | ORAL | Status: DC

## 2015-08-25 MED ORDER — ONDANSETRON HCL 4 MG/2ML IJ SOLN: 4.0000 mg | Freq: Four times a day (QID) | INTRAMUSCULAR | Status: DC | PRN

## 2015-08-25 MED ORDER — LORAZEPAM 1 MG PO TABS: 1.0000 mg | ORAL_TABLET | ORAL | Status: DC | PRN

## 2015-08-25 MED ORDER — HYDROMORPHONE HCL 1 MG/ML IJ SOLN: 0.3000 mg | INTRAMUSCULAR | Status: DC | PRN

## 2015-08-25 MED ORDER — LORAZEPAM 2 MG/ML IJ SOLN
1.0000 mg | INTRAMUSCULAR | Status: DC | PRN
Start: 2015-08-25 — End: 2015-08-26

## 2015-08-25 MED ORDER — HYDROMORPHONE HCL 1 MG/ML IJ SOLN
0.3000 mg | INTRAMUSCULAR | Status: DC | PRN
  Filled 2015-08-25: qty 1

## 2015-08-25 NOTE — Progress Notes (Signed)
PROGRESS NOTE  Bill Walker MGQ:[id number] DOB: January 14, 1928 DOA: 08/09/2015 PCP: Bill Pardon, MD   HPI/Brief narrative 81 y.o.WM with history of chronic respiratory failure on home oxygen 3 L/m, COPD, CAD, HTN, chronic systolic CHF, HLD, CKD was admitted on 07/20/2015 with the complaints of progressive decline over the last few months, loss of weight of 60 pounds, chronic productive cough and shortness of breath. At baseline patient uses a walker. Patient has been having gradual decline. Had 3 falls since June/2016. His hospitalization has been complicated by new onset paroxysmal A. fib with RVR, acute on chronic kidney disease which is limiting treatment of his decompensated CHF/possible cardiorenal syndrome. Cardiology continues to follow. Nephrology consulted and recommend palliative to be really consulted for input due to decline and poor prognosis-called palliative.   Assessment/Plan:  Acute on chronic respiratory failure with hypoxia and hypercapnia Combination of factors: Decompensated CHF, COPD exacerbation, pulmonary hypertension, possible cardio renal syndrome. Patient was initially diuresed: As the patient's creatinine was trending up Lasix was discontinued Treated underlying COPD exacerbation with DuoNeb nebs and Solu-Medrol. -08/25/2015--clinical status declined in the last 12 hours--oxygen saturation in the 70s overnight, placed on Venturi mask -no longer active issue as focus of patient's care transitioned to that focused on comfort  Community acquired pneumonia - Completed 10 days of antibiotics on 10/6. Treated --no longer active issue as focus of patient's care transitioned to that focused on comfort  COPD exacerbation -Treated with oxygen, bronchodilators, IV Solu-Medrol and Brovana. Completed a course of antibiotics. Exacerbation seems to have resolved. Transitioned IV Solu-Medrol to oral prednisone taper.  -no longer active issue as focus of patient's care  transitioned to that focused on comfort  Diptheroids in blood cx -Blood cultures positive Diphtheroids (Corynebacterium). -Most c/w contaminant - no further w/u at this time  --no longer active issue as focus of patient's care transitioned to that focused on comfort  Acute on chronic systolic CHF/pulmonary hypertension -Echo 7/16: LVEF 35-40 percent - Treated with IV Lasix but BUN/creatinine started trending up and hence held.  - Intake output charting probably not accurate. - has features of volume overload (2+ pitting leg edema and increased JVP and chest x-ray consistent with pulmonary edema)  - Starting back diuretics 10/10 (Lasix 20 mg IV 1 dose). Monitor. Cardiology following. Patient hyperkalemic and creatinine worsening.  -Nephrology consulted by cardiology. --no longer active issue as focus of patient's care transitioned to that focused on comfort  Newly diagnosed paroxysmal A. fib with RVR -New onset this admission - Loaded with IV amiodarone on 08/15/15 which was converted to oral amiodarone on 10/6  - Cardiology following. From records, not on anticoagulants secondary to thrombocytopenia. Continued on home Plavix and aspirin  - Was in A. fib with RVR 10/8 AM but reverted to sinus rhythm.  - As per cardiology, continue amiodarone 400 MG twice a day until 10/13 then switch to 200 MG daily. Also on metoprolol 25 MG every 6 hours - Has reverted back to A. fib with RVR in the 100s on 10/11 AM. --no longer active issue as focus of patient's care transitioned to that focused on comfort  Acute on stage III chronic kidney disease - Baseline creatinine may be in the 1.6-1.8 range - Creatinine worsened from 1.8 on 10/7 to 2.>2.5 and diuretics were held. May be cardiorenal syndrome.  - Received a dose of Lasix IV 20 mg 1 on 10/10 and creatinine worsened to 2.9.  - Nephrology consulted  and recommend palliative care input. He is not a candidate for dialysis. --no longer active  issue as focus of patient's care transitioned to that focused on comfort  Hyperkalemia - Secondary to worsening renal functions. Status post Kayexalate and high-dose Lasix started at 80 mg IV twice a day.  --no longer active issue as focus of patient's care transitioned to that focused on comfort  Essential hypertension - Controlled --no longer active issue as focus of patient's care transitioned to that focused on comfort CAD native artery - Asymptomatic  - Continue aspirin 81 mg daily and Plavix 75 mg daily -no longer active issue as focus of patient's care transitioned to that focused on comfort  BPH Continue Flomax. -no longer active issue as focus of patient's care transitioned to that focused on comfort Dyslipidemia - Continue 20 mg QHS  Protein calorie malnutrition/Anorexia -Continue Megace 800 mg Daily  Chronic anemia - Possibly of chronic disease. Stable  Thrombocytopenia - Unclear etiology but stable  Leukocytosis - May be from steroids.   Adult failure to thrive/Plan of care Per previous records patient's wife has been met by the palliative care as well as the primary physician's and expressed interest for DO NOT RESUSCITATE. However patient's wife wants to keep the patient on full code. Previous rounding [facility] MD's had long conversation with patient's 2 sons, wife and other family members explained about the patient's poor prognosis considering multiorgan failure, gradual decline.  -08/25/2015--long discussion with the patient's wife and son at the bedside--discussed the patient's poor prognosis and continued clinical decline despite maximal therapy; case discussed with heart failure team -Long discussion regarding the patient's trajectory over the last several weeks and continue progressive decline; patient continues to  decline clinically with continued signs of multiorgan system failure despite maximal medical therapy  -Cardiology and nephrology have recommended DO  NOT RESUSCITATE with focus on comfort care  -pt's son and wife acknowledge that pt is continuing to decline and showing distress and struggling to breath this am -Wife and son have decided to make patient DO NOT RESUSCITATE with focus on comfort care -Discontinue all blood draws, discontinue radiographs -Discontinue all nonessential medications  Anasarca - Multifactorial secondary to hypoalbuminemia, decompensated CHF, worsening renal insufficiency. Worsening renal function on diuresis. Lasix dose increased 10/11. Follow clinically.   DVT prophylaxis: SCD's  Code Status: Full Family Communication: son and wife-- Disposition Plan: expect in-hospital death Total time--60 min      Procedures/Studies: Dg Chest 2 View  08/23/2015  CLINICAL DATA:  Dyspnea, hypoxia. EXAM: CHEST  2 VIEW COMPARISON:  August 15, 2015. FINDINGS: Stable cardiomegaly with central pulmonary vascular congestion is noted. Stable bilateral pleural effusions are noted. Stable interstitial densities are noted throughout both lungs most consistent with edema or possibly pneumonia. Bony thorax is unremarkable. IMPRESSION: Stable cardiomegaly and probable bilateral pulmonary edema with associated pleural effusions. Electronically Signed   By: Marijo Conception, M.D.   On: 08/23/2015 11:59   Dg Chest 2 View  08/10/2015  CLINICAL DATA:  Shortness of breath EXAM: CHEST  2 VIEW COMPARISON:  07/20/2015 FINDINGS: Progressive bibasilar opacity suggesting pneumonia. Small bilateral pleural effusion. Interstitial coarsening with cephalized blood flow, increased from prior. Chronic cardiomegaly. Background of hyperinflation bronchitic changes. IMPRESSION: 1. Progressive bibasilar airspace disease, primarily concerning for pneumonia. 2. Superimposed CHF pattern. 3. Background COPD. Electronically Signed   By: Monte Fantasia M.D.   On: 08/10/2015 00:53   Ct Head Wo Contrast  08/10/2015  CLINICAL DATA:  Increasing shortness of breath,  weakness and decreased urinary output. Oxygen desaturation. History of alcohol and tobacco use, chronic kidney disease, thrombocytopenia, hypertension. EXAM: CT HEAD WITHOUT CONTRAST TECHNIQUE: Contiguous axial images were obtained from the base of the skull through the vertex without intravenous contrast. COMPARISON:  CT head May 05, 2015 FINDINGS: Moderate to severe ventriculomegaly, on the basis of global parenchymal brain volume loss, unchanged from prior imaging. No intraparenchymal hemorrhage, mass effect nor midline shift. Patchy supratentorial white matter hypodensities are less than expected for patient's age and though non-specific suggest sequelae of chronic small vessel ischemic disease. No acute large vascular territory infarcts. No abnormal extra-axial fluid collections. Basal cisterns are patent. Moderate calcific atherosclerosis of the carotid siphons. No skull fracture. The included ocular globes and orbital contents are non-suspicious. Status post bilateral ocular lens implants. Near complete opacification LEFT sphenoid sinus, small RIGHT sphenoid sinus air-fluid level. IMPRESSION: No acute intracranial process. Stable chronic changes including moderate to severe global brain atrophy. Worsening LEFT greater than RIGHT sphenoid sinusitis. Electronically Signed   By: Elon Alas M.D.   On: 08/10/2015 00:31   US Renal  08/24/2015  CLINICAL DATA:  Acute renal failure EXAM: RENAL / URINARY TRACT ULTRASOUND COMPLETE COMPARISON:  None. FINDINGS: Right Kidney: Length: 11.1 cm. Echogenicity within normal limits. There is mild cortical thinning. No hydronephrosis visualized. There is a 0.9 x 0.9 x 1 cm cyst. Left Kidney: Length: 11.1 cm. Echogenicity within normal limits. There is mild cortical thinning. No mass or hydronephrosis visualized. Bladder: Appears normal for degree of bladder distention. IMPRESSION: Mild cortical renal thinning bilaterally. Small cyst in the right kidney. Electronically  Signed   By: Abelardo Diesel M.D.   On: 08/24/2015 14:59   Dg Chest Port 1 View  08/15/2015  CLINICAL DATA:  Pneumonia EXAM: PORTABLE CHEST 1 VIEW COMPARISON:  08/13/2015 FINDINGS: Bilateral airspace disease right greater than left. Mild improvement in right-sided airspace disease. Moderate right pleural effusion also unchanged. Small left effusion. Bibasilar atelectasis Cardiac enlargement with vascular congestion. IMPRESSION: Bilateral airspace disease right greater than left. Mild improvement on the right. Findings could be due to congestive heart failure and edema versus pneumonia. Electronically Signed   By: Franchot Gallo M.D.   On: 08/15/2015 07:10   Dg Chest Port 1 View  08/13/2015  CLINICAL DATA:  Pneumonia EXAM: PORTABLE CHEST 1 VIEW COMPARISON:  08/10/2015 FINDINGS: Right greater than left hazy airspace consolidation is increased since previously. Posteriorly tracking moderate right and small left pleural effusions are noted. Mild enlargement of cardiomediastinal silhouette is identified with central vascular congestion. Hiatal hernia reidentified versus left hemidiaphragmatic eventration. IMPRESSION: Increased bibasilar hazy airspace opacities which could represent pneumonia although alveolar edema or other filling processes such as ARDS could appear similar. Electronically Signed   By: Conchita Paris M.D.   On: 08/13/2015 07:22        Subjective:  patient is in moderate distress this morning on Venturi mask. Not able to answer questions appropriately at this time. No signs of vomiting or uncontrolled pain.   Objective: Filed Vitals:   08/24/15 1941 08/24/15 1942 08/24/15 2050 08/25/15 0500  BP:  122/55  120/67  Pulse:  76  101  Temp:  97.8 F (36.6 C)  98.1 F (36.7 C)  TempSrc:  Oral  Oral  Resp:  20  20  Height:      Weight:    90.4 kg (199 lb 4.7 oz)  SpO2: 97% 96% 92% 92%    Intake/Output Summary (Last 24 hours) at 08/25/15  0854 Last data filed at 08/24/15 1800  Gross  per 24 hour  Intake   1644 ml  Output    550 ml  Net   1094 ml   Weight change: 0.7 kg (1 lb 8.7 oz) Exam:   General:  Pt is alert, follows commands appropriately, not in acute distress  HEENT: No icterus, No thrush, No neck mass, [place]/AT  Cardiovascular: IRRR, S1/S2, no rubs, no gallops +JVD  Respiratory: Bilateral crackles   Abdomen: Soft/+BS, non tender, non distended, no guarding  Extremities: 3+ LE edema, No lymphangitis, No petechiae, No rashes, no synovitis  Data Reviewed: Basic Metabolic Panel:  Recent Labs Lab 08/20/15 0855 08/21/15 0358 08/23/15 0334 08/24/15 0514 08/24/15 1554 08/25/15 0348  NA 140 141 138 137 139 137  K 4.1 4.0 4.9 6.0* 5.2* 5.0  CL 87* 86* 83* 84* 85* 81*  CO2 47* 46* 47* 43* 44* 45*  GLUCOSE 139* 104* 147* 116* 160* 130*  BUN 55* 65* 86* 101* 105* 96*  CREATININE 1.89* 2.24* 2.52* 2.90* 3.04* 2.94*  CALCIUM 8.6* 8.6* 9.0 9.1 8.8* 8.8*  MG 1.7 1.9  --   --   --   --   PHOS  --   --   --   --   --  4.0   Liver Function Tests:  Recent Labs Lab 08/20/15 0855 08/21/15 0358 08/25/15 0348  AST 20 15  --   ALT 8* 6*  --   ALKPHOS 40 38  --   BILITOT 1.2 1.1  --   PROT 5.0* 4.7*  --   ALBUMIN 2.6* 2.4* 2.9*   No results for input(s): LIPASE, AMYLASE in the last 168 hours. No results for input(s): AMMONIA in the last 168 hours. CBC:  Recent Labs Lab 08/19/15 0020 08/20/15 0855 08/21/15 0358 08/23/15 0334 08/25/15 0348  WBC 12.3* 17.2* 12.7* 12.0* 13.6*  NEUTROABS  --  14.9*  --   --   --   HGB 11.0* 12.0* 11.3* 10.2* 11.2*  HCT 36.0* 38.6* 35.6* 32.7* 36.0*  MCV 108.8* 107.8* 109.2* 107.6* 108.4*  PLT 65* 81* 70* 95* 122*   Cardiac Enzymes: No results for input(s): CKTOTAL, CKMB, CKMBINDEX, TROPONINI in the last 168 hours. BNP: Invalid input(s): POCBNP CBG:  Recent Labs Lab 08/23/15 1712  GLUCAP 134*    No results found for this or any previous visit (from the past 240 hour(s)).   Scheduled Meds: . amiodarone   400 mg Oral BID  . arformoterol  15 mcg Nebulization BID  . budesonide (PULMICORT) nebulizer solution  0.25 mg Nebulization BID  . clopidogrel  75 mg Oral Daily  . furosemide  80 mg Intravenous BID  . metolazone  2.5 mg Oral Once  . sodium chloride  3 mL Intravenous Q12H  . sodium chloride  3 mL Intravenous Q12H   Continuous Infusions: . sodium chloride 10 mL/hr at 08/25/15 0627     Jurni Cesaro, DO  [facility] Pager [phone]  If 7PM-7AM, please contact night-coverage [email or web address] Password [email or web address] 08/25/2015, 8:54 AM   LOS: 15 days

## 2015-08-25 NOTE — Progress Notes (Signed)
Subjective: Over night O2 sats dropped to 70s. Oxygen increased to 70% but weaned to 40%.  Poor appetite. Wife considering NCB.    Objective: Vital signs in last 24 hours: Temp:  [97.8 F (36.6 C)-98.1 F (36.7 C)] 98.1 F (36.7 C) (10/12 0500) Pulse Rate:  [76-102] 101 (10/12 0500) Resp:  [20-22] 20 (10/12 0500) BP: (103-122)/(55-67) 120/67 mmHg (10/12 0500) SpO2:  [70 %-97 %] 92 % (10/12 0500) FiO2 (%):  [40 %] 40 % (10/11 2050) Weight:  [199 lb 4.7 oz (90.4 kg)] 199 lb 4.7 oz (90.4 kg) (10/12 0500) Last BM Date: 08/24/15  Intake/Output from previous day: 10/11 0701 - 10/12 0700 In: 1644 [P.O.:1194; IV Piggyback:450] Out: 550 [Urine:550] Intake/Output this shift:    Medications Scheduled Meds: . amiodarone  400 mg Oral BID  . arformoterol  15 mcg Nebulization BID  . aspirin  81 mg Oral Daily  . budesonide (PULMICORT) nebulizer solution  0.25 mg Nebulization BID  . clopidogrel  75 mg Oral Daily  . dextromethorphan-guaiFENesin  1 tablet Oral BID  . feeding supplement (ENSURE ENLIVE)  237 mL Oral BID BM  . furosemide  80 mg Intravenous STAT  . furosemide  80 mg Intravenous BID  . megestrol  800 mg Oral Daily  . metolazone  2.5 mg Oral Once  . metoprolol tartrate  25 mg Oral Q6H  . predniSONE  40 mg Oral Q breakfast  . simvastatin  20 mg Oral QHS  . sodium chloride  3 mL Intravenous Q12H  . sodium chloride  3 mL Intravenous Q12H   Continuous Infusions: . sodium chloride 10 mL/hr at 08/25/15 0627   PRN Meds:.sodium chloride, acetaminophen **OR** [DISCONTINUED] acetaminophen, levalbuterol, ondansetron **OR** [DISCONTINUED] ondansetron (ZOFRAN) IV, sodium chloride  PE: General appearance: Dyspneic at rest. On 40% oxygen Lungs: Crackles in bases. Shallow breathing.  Heart: irregular rhythm Abdomen: +BS, soft, nontender Extremities: 3+ anasarca R and LLE SCDs.  Pulses: 2+ and symmetric Skin: Warm and dry Neurologic: Oriented to self only  Lab Results:    Recent Labs  08/23/15 0334 08/25/15 0348  WBC 12.0* 13.6*  HGB 10.2* 11.2*  HCT 32.7* 36.0*  PLT 95* 122*   BMET  Recent Labs  08/24/15 0514 08/24/15 1554 08/25/15 0348  NA 137 139 137  K 6.0* 5.2* 5.0  CL 84* 85* 81*  CO2 43* 44* 45*  GLUCOSE 116* 160* 130*  BUN 101* 105* 96*  CREATININE 2.90* 3.04* 2.94*  CALCIUM 9.1 8.8* 8.8*     Assessment/Plan  Principal Problem:   Acute on chronic respiratory failure with hypoxia (HCC) Active Problems:   Essential hypertension   Coronary atherosclerosis   Chronic respiratory failure with hypercapnia (HCC)   BPH (benign prostatic hyperplasia)   COPD (chronic obstructive pulmonary disease) (HCC)   Acute on chronic systolic CHF (congestive heart failure) (HCC)   Dyslipidemia   Anorexia   Loss of weight   CAP (community acquired pneumonia)   Palliative care encounter   DNR (do not resuscitate) discussion   Pulmonary hypertension (HCC)   Acute pulmonary edema (HCC)   CAD in native artery   Protein-calorie malnutrition (HCC)   COPD exacerbation (HCC)   Atrial fibrillation with RVR (HCC)   Systolic congestive heart failure (HCC)   Paroxysmal atrial fibrillation (HCC)   Acute renal failure superimposed on stage 3 chronic kidney disease (HCC)   Failure to thrive in adult   Hyperkalemia   Anasarca  1. A/C Respiratory Failure Over night requiring increased  oxygenation for O2 sats in the 70s. Now on 40% but dyspneic at rest.  2. A/C Systolic HF Today he was scheduled for RHC however to due declining respiratory status.  Volume status clearly elevated. Dyspneic at rest. Give 80 mg IV lasix now and 2.5 mg metolazone . Continue lasix 80 mg twice a day.  Needs palliative care input as he will likely benefit from Hospice.   2. COPD exacerbation  management per primary team 3. A fib new onset This admit was loaded on amiodarone and converted to NSR. Today he is back in A fib with controlled rate. Stop lopressor with acute  decompensation.  Has not been on anticoagulants due to low platelets. Need to address after decision made regarding code status.  4.   Hyperkalemia Kayexalate given 10/11. Todays K 5.0  5.  A/ C CKD Per Dr Arrie Aranoladonato needs palliative care for goals of care. Not a candidate for HD.  6. Hypoalbuminemia  I talked to Dr Tat regarding above. His wife is considering NCB.  Cancel RHC due to declining status. Palliative Care for goals of care--> Hospice.     LOS: 15 days    Amy Clegg NP_C   08/25/2015 8:00 AM  Patient seen with NP, agree with the above note.    Mr Herbert MoorsSwaim diuresed poorly yesterday, he is very volume overloaded with BUN/creatinine remaining quite elevated.  Somewhat confused this morning, on facemask for oxygen.  Dr Arrie Aranoladonato laid out his poor prognosis yesterday in conversation with wife and patient, and this morning they tell me that he has opted for comfort care only.  They do not want RHC or inotropes.  I think this is reasonable at this point.  Palliative care is coming to talk with him this morning. Now DNR.   Would continue IV Lasix and will give dose of metolazone.    Marca AnconaDalton Jassiel Flye 08/25/2015 9:07 AM

## 2015-08-25 NOTE — Progress Notes (Signed)
Consultation Note Date: 08/25/2015   Patient Name: Bill Walker  DOB: Nov 18, 1927  MRN: [id number]  Age / Sex: 81 y.o., male   PCP: Abner Greenspan, MD Referring Physician: Orson Eva, MD  Reason for Consultation: Establishing goals of care and Terminal care  Palliative Care Assessment and Plan Summary of Established Goals of Care and Medical Treatment Preferences   Clinical Assessment/Narrative: 81 y.o.WM with history of chronic respiratory failure on home oxygen 3 L/m, COPD, CAD, HTN, chronic systolic CHF, HLD, CKD was admitted on 07/20/2015 with the complaints of progressive decline over the last few months, loss of weight of 60 pounds, chronic productive cough and shortness of breath. Hospitalization has been complicated by new onset paroxysmal A. fib with RVR, acute on chronic kidney disease which is limiting treatment of his decompensated CHF/possible cardiorenal syndrome. Cardiology and nephrology consulted and recommendation to have palliative re-engage with family.  I met with the patient, his wife, and his son. They report speaking with Dr. Carles Collet today and have good understanding of his prognosis. We reviewed prior conversations about end-of-life wishes and discussed his treatment course to this point in time.  The family is in agreement that based upon his current situation, his wishes would be to focus on being as comfortable as possible. Mr. Bill Walker was awake and alert but seemed intermittently confused. No acute distress with nonrebreather in place. I reviewed with him the plan to ensure that he had any medications needed to ensure his comfort and he nodded in agreement with this. While he is awake and able to precipitate in some conversation, he does remain confused.  His family reports that everything is been discussed with him in great detail by themselves and Dr. Carles Collet and he does endorse understanding that plan moving forward is to focus only on his comfort.  They also feel that this would  be in line with his prior expressed wishes for a natural death.  - Plan to focus on comfort care. Is high likelihood this will be a terminal admission. - If patient stabilizes, could consider transfer to residential hospice facility. I mentioned this briefly with family but we did not discuss in any detail. They are in agreement to seeing how he does overnight and readdress tomorrow if it appears he may be be clinically stable to consider transfer.  Symptom Management: Pain or Shortness of breath: Currently well controlled.   - Plan for addition of Dilaudid 0.3 mg IV on as-needed basis. I would have low threshold to titrate this up, but he has not required any opioids over the last several days, is currently comfortable, and is awake and alert and able to interact with family at this time. Based on this, we'll plan to start slowly with medications but escalate as needed to ensure his comfort.  Agitation/anxiety: Currently not an issue for patient.   - Plan for addition of Ativan as needed for anxiety - Plan for addition of Haldol as needed for agitation  Nausea: Currently not an issue for patient. - Plan for addition of Zofran as needed for nausea or vomiting  Secretions: Currently not an issue for patient.   - Plan for addition of Robinul as needed  Bowel regimen:   - Plan for Dulcolax as needed  Prognosis: Hours - Days  Discharge Planning:  There is a high likelihood this will be a terminal admission. I'm very concerned that he will continue decompensate quickly.  If, however, he is stable enough tomorrow to consider transfer, I  told his family we may want to consider trying to have him transferred to residential hospice facility (based upon his clinical course overnight).       Chief Complaint/History of Present Illness:   Patient remains confused.  He denies any pain shortness of breath nausea or vomiting. He denies any other complaints  Primary Diagnoses  Present on Admission:   . COPD (chronic obstructive pulmonary disease) ([place]) . Essential hypertension . Chronic respiratory failure with hypercapnia ([place]) . Acute on chronic systolic CHF (congestive heart failure) ([place]) . Coronary atherosclerosis . Dyslipidemia . BPH (benign prostatic hyperplasia) . Anorexia . Loss of weight . CAP (community acquired pneumonia) . Palliative care encounter . DNR (do not resuscitate) discussion . Acute on chronic respiratory failure with hypoxia ([place]) . Pulmonary hypertension ([place]) . Acute pulmonary edema ([facility]) . CAD in native artery . Protein-calorie malnutrition ([place]) . COPD exacerbation ([place]) . Atrial fibrillation with RVR ([place])  Palliative Review of Systems: Denies complaints I have reviewed the medical record, interviewed the patient and family, and examined the patient. The following aspects are pertinent.  Past Medical History  Diagnosis Date  . Ischemic cardiomyopathy     a. 02/2005 EF 32% (myoview report).  . Hypertension   . Coronary artery disease     a. s/p prior MI w/ RCA stenting;  b. 03/2011 Cath: LM 20d, LAD 40p/100p, LCX 40p, L->L collats to distal LAD, RCA dominant, patent prox stent, 17m ISR, 50-73m, 50-60d, 60d-->Med Rx;  c. 04/2015 NSTEMI/Cath: LM 45, LAD 80/10m, 100d (L->L collats), D2 99ost, OM1 60, RCA patent stent prox, 100m, 70d, RPAV1/2 70->Med Rx.  Marland Kitchen Hyperlipidemia   . Vitamin B12 deficiency   . History of pneumonia   . Thrombocytopenia   . Cor pulmonale   . Hearing loss   . Alcohol abuse, unspecified   . Tobacco use disorder   . CKD (chronic kidney disease), stage III   . Osteoarthritis   . Gout   . Diverticulosis of colon   . COPD (chronic obstructive pulmonary disease)   . Anemia   . Bronchiectasis     CT Chest 07/07/09 bilateral lower lobe medial bronchiectasis  . Chronic systolic CHF (congestive heart failure)     a. 02/2005 EF 32% (myoview report).   Social History   Social History  . Marital Status: Married    Spouse Name:  N/A  . Number of Children: N/A  . Years of Education: N/A   Occupational History  . Retired    Social History Main Topics  . Smoking status: Former Smoker -- 1.00 packs/day for 48 years    Quit date: 11/13/1996  . Smokeless tobacco: None  . Alcohol Use: No  . Drug Use: No  . Sexual Activity: Not Currently   Other Topics Concern  . None   Social History Narrative   Lives in [place] with his wife      Retired      No regular exercise due to co-morbidities      Cannot walk long distnaces   Family History  Problem Relation Age of Onset  . Lymphoma Brother   . Prostate cancer Brother    Scheduled Meds: . amiodarone  400 mg Oral BID  . arformoterol  15 mcg Nebulization BID  . budesonide (PULMICORT) nebulizer solution  0.25 mg Nebulization BID  . sodium chloride  3 mL Intravenous Q12H  . sodium chloride  3 mL Intravenous Q12H   Continuous Infusions: . sodium chloride 10 mL/hr at 08/25/15 [phone]  PRN Meds:.sodium chloride, acetaminophen **OR** acetaminophen, antiseptic oral rinse, glycopyrrolate **OR** glycopyrrolate **OR** glycopyrrolate, haloperidol **OR** haloperidol **OR** haloperidol lactate, HYDROmorphone (DILAUDID) injection, levalbuterol, LORazepam **OR** LORazepam **OR** LORazepam, ondansetron **OR** ondansetron (ZOFRAN) IV, polyvinyl alcohol, sodium chloride Medications Prior to Admission:  Prior to Admission medications   Medication Sig Start Date End Date Taking? Authorizing Provider  amLODipine (NORVASC) 5 MG tablet Take 5 mg by mouth daily. 07/02/15  Yes Historical Provider, MD  aspirin 81 MG tablet Take 1 tablet (81 mg total) by mouth daily. 05/09/15  Yes Ok Anis, NP  calcium carbonate (TUMS - DOSED IN MG ELEMENTAL CALCIUM) 500 MG chewable tablet Chew 1 tablet by mouth daily.   Yes Historical Provider, MD  cholecalciferol (VITAMIN D) 1000 UNITS tablet Take 1 tablet (1,000 Units total) by mouth daily. 04/01/13  Yes Wendall Stade, MD  clopidogrel  (PLAVIX) 75 MG tablet TAKE ONE TABLET BY MOUTH ONCE DAILY 07/15/15  Yes Judy Pimple, MD  diphenhydrAMINE (SOMINEX) 25 MG tablet Take 50 mg by mouth at bedtime as needed for sleep.    Yes Historical Provider, MD  furosemide (LASIX) 80 MG tablet TAKE 1 TABLET BY MOUTH DAILY & TAKE 1/2 TAB BY MOUTH IN THE EVENINGS ON Monday, Wed & Friday ONLY.   Yes Historical Provider, MD  isosorbide mononitrate (IMDUR) 30 MG 24 hr tablet TAKE TWO TABLETS BY MOUTH ONCE DAILY 06/21/15  Yes Judy Pimple, MD  metoprolol succinate (TOPROL-XL) 100 MG 24 hr tablet Take 1 tablet (100 mg total) by mouth daily. Take with or immediately following a meal. 07/14/15  Yes Wendall Stade, MD  nitroGLYCERIN (NITROSTAT) 0.4 MG SL tablet Place 0.4 mg under the tongue every 5 (five) minutes as needed for chest pain (up to 3 doses).   Yes Historical Provider, MD  ranolazine (RANEXA) 500 MG 12 hr tablet Take 1 tablet (500 mg total) by mouth 2 (two) times daily. 05/09/15  Yes Ok Anis, NP  simvastatin (ZOCOR) 20 MG tablet TAKE ONE TABLET BY MOUTH AT BEDTIME 07/05/15  Yes Judy Pimple, MD  tamsulosin (FLOMAX) 0.4 MG CAPS capsule Take 1 capsule (0.4 mg total) by mouth daily. 04/27/15  Yes Judy Pimple, MD  tiotropium (SPIRIVA HANDIHALER) 18 MCG inhalation capsule INHALE ONE CAPSULE BY MOUTH ONCE DAILY 02/08/15  Yes Judy Pimple, MD  vitamin B-12 (CYANOCOBALAMIN) 1000 MCG tablet Take 1,000 mcg by mouth daily.   Yes Historical Provider, MD  NON FORMULARY Oxygen- 3 lpm 24/7    Historical Provider, MD   Allergies  Allergen Reactions  . Doxazosin Mesylate Rash    rash  . Penicillins Other (See Comments)    REACTION: unknown reaction   CBC:    Component Value Date/Time   WBC 13.6* 08/25/2015 0348   WBC 7.6 12/05/2012 1124   WBC 8.1 09/01/2010 1539   HGB 11.2* 08/25/2015 0348   HGB 10.5* 12/05/2012 1124   HGB 12.6* 09/01/2010 1539   HCT 36.0* 08/25/2015 0348   HCT 31.7* 12/05/2012 1124   HCT 37.4* 09/01/2010 1539   PLT 122*  08/25/2015 0348   PLT 163 12/05/2012 1124   PLT 123* 09/01/2010 1539   MCV 108.4* 08/25/2015 0348   MCV 97.9 12/05/2012 1124   MCV 93 09/01/2010 1539   NEUTROABS 14.9* 08/20/2015 0855   NEUTROABS 5.8 12/05/2012 1124   NEUTROABS 6.6* 09/01/2010 1539   LYMPHSABS 1.0 08/20/2015 0855   LYMPHSABS 1.0 12/05/2012 1124   LYMPHSABS 0.8* 09/01/2010 1539  MONOABS 1.3* 08/20/2015 0855   MONOABS 0.6 12/05/2012 1124   EOSABS 0.0 08/20/2015 0855   EOSABS 0.2 12/05/2012 1124   EOSABS 0.1 09/01/2010 1539   BASOSABS 0.0 08/20/2015 0855   BASOSABS 0.0 12/05/2012 1124   BASOSABS 0.0 09/01/2010 1539   Comprehensive Metabolic Panel:    Component Value Date/Time   NA 137 08/25/2015 0348   NA 145 12/16/2009 1105   K 5.0 08/25/2015 0348   K 4.2 12/16/2009 1105   CL 81* 08/25/2015 0348   CL 102 12/16/2009 1105   CO2 45* 08/25/2015 0348   CO2 35* 12/16/2009 1105   BUN 96* 08/25/2015 0348   BUN 27* 12/16/2009 1105   CREATININE 2.94* 08/25/2015 0348   CREATININE 1.7* 12/16/2009 1105   GLUCOSE 130* 08/25/2015 0348   GLUCOSE 119* 12/16/2009 1105   CALCIUM 8.8* 08/25/2015 0348   CALCIUM 8.5 12/16/2009 1105   AST 15 08/21/2015 0358   AST 21 03/17/2009 1301   ALT 6* 08/21/2015 0358   ALT 9* 03/17/2009 1301   ALKPHOS 38 08/21/2015 0358   ALKPHOS 61 03/17/2009 1301   BILITOT 1.1 08/21/2015 0358   BILITOT 0.80 03/17/2009 1301   PROT 4.7* 08/21/2015 0358   PROT 5.8* 03/17/2009 1301   ALBUMIN 2.9* 08/25/2015 0348   ALBUMIN 3.3 03/17/2009 1301    Physical Exam: Vital Signs: BP 120/67 mmHg  Pulse 101  Temp(Src) 98.1 F (36.7 C) (Oral)  Resp 20  Ht $R'5\' 11"'bd$  (1.803 m)  Wt 90.4 kg (199 lb 4.7 oz)  BMI 27.81 kg/m2  SpO2 92% SpO2: SpO2: 92 % O2 Device: O2 Device: Venturi Mask O2 Flow Rate: O2 Flow Rate (L/min): 8 L/min Intake/output summary:  Intake/Output Summary (Last 24 hours) at 08/25/15 0956 Last data filed at 08/24/15 1800  Gross per 24 hour  Intake   1167 ml  Output    550 ml  Net     617 ml   LBM: Last BM Date: 08/24/15 Baseline Weight: Weight: 83.462 kg (184 lb) Most recent weight: Weight: 90.4 kg (199 lb 4.7 oz)  Exam Findings:   General: Pt is alert but confused, follows commands appropriately, not in acute distress, wearing nonrebreather  HEENT: No icterus, No thrush  Cardiovascular: Irregular +JVD  Respiratory: Bilateral crackles   Abdomen: Soft/+BS, non tender, non distended, no guarding  Extremities: 3+ LE edema, no rash         Palliative Performance Scale: 20             Additional Data Reviewed: Recent Labs     08/23/15  0334   08/24/15  1554  08/25/15  0348  WBC  12.0*   --    --   13.6*  HGB  10.2*   --    --   11.2*  PLT  95*   --    --   122*  NA  138   < >  139  137  BUN  86*   < >  105*  96*  CREATININE  2.52*   < >  3.04*  2.94*   < > = values in this interval not displayed.     Time In: 1915 Time Out: 1945 Time Total: 30 Greater than 50%  of this time was spent counseling and coordinating care related to the above assessment and plan.  Signed by: Micheline Rough, MD  Micheline Rough, MD  08/25/2015, 9:56 AM  Please contact Palliative Medicine Team phone at [phone] for questions and concerns.

## 2015-08-25 NOTE — Progress Notes (Signed)
Patient ID: Bill Walker, male   DOB: 1927-12-22, 81 y.o.   MRN: [id number] S:decision to transition to CMO noted O:BP 120/67 mmHg  Pulse 101  Temp(Src) 98.1 F (36.7 C) (Oral)  Resp 20  Ht  (1.803 m)  Wt 90.4 kg (199 lb 4.7 oz)  BMI 27.81 kg/m2  SpO2 92%  Intake/Output Summary (Last 24 hours) at 08/25/15 0834 Last data filed at 08/24/15 1800  Gross per 24 hour  Intake   1644 ml  Output    550 ml  Net   1094 ml   Intake/Output: I/O last 3 completed shifts: In: 1704 [P.O.:1254; IV Piggyback:450] Out: 750 [Urine:750]  Intake/Output this shift:    Weight change: 0.7 kg (1 lb 8.7 oz) DGU:YQIHK, elderly WM in mild distress VQQ:VZDGL Ext:+edema   Recent Labs Lab 08/19/15 0020 08/20/15 0855 08/21/15 0358 08/23/15 0334 08/24/15 0514 08/24/15 1554 08/25/15 0348  NA 136 140 141 138 137 139 137  K 3.7 4.1 4.0 4.9 6.0* 5.2* 5.0  CL 83* 87* 86* 83* 84* 85* 81*  CO2 46* 47* 46* 47* 43* 44* 45*  GLUCOSE 128* 139* 104* 147* 116* 160* 130*  BUN 43* 55* 65* 86* 101* 105* 96*  CREATININE 1.54* 1.89* 2.24* 2.52* 2.90* 3.04* 2.94*  ALBUMIN  --  2.6* 2.4*  --   --   --  2.9*  CALCIUM 7.9* 8.6* 8.6* 9.0 9.1 8.8* 8.8*  PHOS  --   --   --   --   --   --  4.0  AST  --  20 15  --   --   --   --   ALT  --  8* 6*  --   --   --   --    Liver Function Tests:  Recent Labs Lab 08/20/15 0855 08/21/15 0358 08/25/15 0348  AST 20 15  --   ALT 8* 6*  --   ALKPHOS 40 38  --   BILITOT 1.2 1.1  --   PROT 5.0* 4.7*  --   ALBUMIN 2.6* 2.4* 2.9*   No results for input(s): LIPASE, AMYLASE in the last 168 hours. No results for input(s): AMMONIA in the last 168 hours. CBC:  Recent Labs Lab 08/19/15 0020 08/20/15 0855 08/21/15 0358 08/23/15 0334 08/25/15 0348  WBC 12.3* 17.2* 12.7* 12.0* 13.6*  NEUTROABS  --  14.9*  --   --   --   HGB 11.0* 12.0* 11.3* 10.2* 11.2*  HCT 36.0* 38.6* 35.6* 32.7* 36.0*  MCV 108.8* 107.8* 109.2* 107.6* 108.4*  PLT 65* 81* 70* 95* 122*   Cardiac  Enzymes: No results for input(s): CKTOTAL, CKMB, CKMBINDEX, TROPONINI in the last 168 hours. CBG:  Recent Labs Lab 08/23/15 1712  GLUCAP 134*    Iron Studies: No results for input(s): IRON, TIBC, TRANSFERRIN, FERRITIN in the last 72 hours. Studies/Results: Dg Chest 2 View  08/23/2015  CLINICAL DATA:  Dyspnea, hypoxia. EXAM: CHEST  2 VIEW COMPARISON:  August 15, 2015. FINDINGS: Stable cardiomegaly with central pulmonary vascular congestion is noted. Stable bilateral pleural effusions are noted. Stable interstitial densities are noted throughout both lungs most consistent with edema or possibly pneumonia. Bony thorax is unremarkable. IMPRESSION: Stable cardiomegaly and probable bilateral pulmonary edema with associated pleural effusions. Electronically Signed   By: Lupita Raider, M.D.   On: 08/23/2015 11:59   US Renal  08/24/2015  CLINICAL DATA:  Acute renal failure EXAM: RENAL / URINARY TRACT ULTRASOUND COMPLETE COMPARISON:  None.  FINDINGS: Right Kidney: Length: 11.1 cm. Echogenicity within normal limits. There is mild cortical thinning. No hydronephrosis visualized. There is a 0.9 x 0.9 x 1 cm cyst. Left Kidney: Length: 11.1 cm. Echogenicity within normal limits. There is mild cortical thinning. No mass or hydronephrosis visualized. Bladder: Appears normal for degree of bladder distention. IMPRESSION: Mild cortical renal thinning bilaterally. Small cyst in the right kidney. Electronically Signed   By: Sherian ReinWei-Chen  Lin M.D.   On: 08/24/2015 14:59   . amiodarone  400 mg Oral BID  . arformoterol  15 mcg Nebulization BID  . aspirin  81 mg Oral Daily  . budesonide (PULMICORT) nebulizer solution  0.25 mg Nebulization BID  . clopidogrel  75 mg Oral Daily  . dextromethorphan-guaiFENesin  1 tablet Oral BID  . feeding supplement (ENSURE ENLIVE)  237 mL Oral BID BM  . furosemide  80 mg Intravenous STAT  . furosemide  80 mg Intravenous BID  . megestrol  800 mg Oral Daily  . metolazone  2.5 mg Oral  Once  . metoprolol tartrate  25 mg Oral Q6H  . predniSONE  40 mg Oral Q breakfast  . simvastatin  20 mg Oral QHS  . sodium chloride  3 mL Intravenous Q12H  . sodium chloride  3 mL Intravenous Q12H    BMET    Component Value Date/Time   NA 137 08/25/2015 0348   NA 145 12/16/2009 1105   K 5.0 08/25/2015 0348   K 4.2 12/16/2009 1105   CL 81* 08/25/2015 0348   CL 102 12/16/2009 1105   CO2 45* 08/25/2015 0348   CO2 35* 12/16/2009 1105   GLUCOSE 130* 08/25/2015 0348   GLUCOSE 119* 12/16/2009 1105   BUN 96* 08/25/2015 0348   BUN 27* 12/16/2009 1105   CREATININE 2.94* 08/25/2015 0348   CREATININE 1.7* 12/16/2009 1105   CALCIUM 8.8* 08/25/2015 0348   CALCIUM 8.5 12/16/2009 1105   GFRNONAA 18* 08/25/2015 0348   GFRAA 21* 08/25/2015 0348   CBC    Component Value Date/Time   WBC 13.6* 08/25/2015 0348   WBC 7.6 12/05/2012 1124   WBC 8.1 09/01/2010 1539   RBC 3.32* 08/25/2015 0348   RBC 3.23* 12/05/2012 1124   RBC 4.03* 09/01/2010 1539   RBC 3.92* 03/17/2009 1301   HGB 11.2* 08/25/2015 0348   HGB 10.5* 12/05/2012 1124   HGB 12.6* 09/01/2010 1539   HCT 36.0* 08/25/2015 0348   HCT 31.7* 12/05/2012 1124   HCT 37.4* 09/01/2010 1539   PLT 122* 08/25/2015 0348   PLT 163 12/05/2012 1124   PLT 123* 09/01/2010 1539   MCV 108.4* 08/25/2015 0348   MCV 97.9 12/05/2012 1124   MCV 93 09/01/2010 1539   MCH 33.7 08/25/2015 0348   MCH 32.4 12/05/2012 1124   MCH 31.2 09/01/2010 1539   MCHC 31.1 08/25/2015 0348   MCHC 33.1 12/05/2012 1124   MCHC 33.6 09/01/2010 1539   RDW 13.9 08/25/2015 0348   RDW 15.7* 12/05/2012 1124   RDW 14.7* 09/01/2010 1539   LYMPHSABS 1.0 08/20/2015 0855   LYMPHSABS 1.0 12/05/2012 1124   LYMPHSABS 0.8* 09/01/2010 1539   MONOABS 1.3* 08/20/2015 0855   MONOABS 0.6 12/05/2012 1124   EOSABS 0.0 08/20/2015 0855   EOSABS 0.2 12/05/2012 1124   EOSABS 0.1 09/01/2010 1539   BASOSABS 0.0 08/20/2015 0855   BASOSABS 0.0 12/05/2012 1124   BASOSABS 0.0 09/01/2010 1539      Assessment/Plan: 1. AKI/CKD with hyperkalemia- in setting of decompensated CHF and increased  diuretic use. Not candidate for dialysis. Continue with conservative therapy, continued diuresis for patient comfort. Ask palliative care to come back and discuss clinical decline with family and consider moving towards comfort care. 2. Hyperkalemia- treated with kayexalate and lasix.  1. Follow k levels 3. Acute on Chronic CHF due to ischemic cardiomyopathy- increase diuretics for patient comfort 4. CAD 5. FTT- declining overall condition with multiple end-stage disease processes. 6. Protein/calorie malnutrition 7. Disposition- overall prognosis is poor. Transitioning to comfort measures.  Will sign off.  Appreciated palliative care's assistance.   Sonam Huelsmann A

## 2015-08-26 DIAGNOSIS — R0600 Dyspnea, unspecified: Secondary | ICD-10-CM

## 2015-08-26 MED ORDER — LORAZEPAM 2 MG/ML IJ SOLN: 1.0000 mg | INTRAMUSCULAR | Status: AC | PRN

## 2015-08-26 MED ORDER — HALOPERIDOL LACTATE 2 MG/ML PO CONC: 2.0000 mg | ORAL | Status: AC | PRN

## 2015-08-26 MED ORDER — GLYCOPYRROLATE 0.2 MG/ML IJ SOLN: 0.2000 mg | INTRAMUSCULAR | Status: AC | PRN

## 2015-08-26 MED ORDER — HYDROMORPHONE HCL 1 MG/ML IJ SOLN: 0.3000 mg | INTRAMUSCULAR | Status: AC | PRN

## 2015-08-26 NOTE — Clinical Social Work Placement (Signed)
   CLINICAL SOCIAL WORK PLACEMENT  NOTE  Date:  2015-08-30  Patient Details  Name: Bill Walker MRN: [id number] Date of Birth: 08/07/1928  Clinical Social Work is seeking post-discharge placement for this patient at the Skilled  Nursing Facility level of care (*CSW will initial, date and re-position this form in  chart as items are completed):  Yes   Patient/family provided with [facility] Clinical Social Work Department's list of facilities offering this level of care within the geographic area requested by the patient (or if unable, by the patient's family).  Yes   Patient/family informed of their freedom to choose among providers that offer the needed level of care, that participate in Medicare, Medicaid or managed care program needed by the patient, have an available bed and are willing to accept the patient.  Yes   Patient/family informed of [facility]'s ownership interest in [facility] and [facility], as well as of the fact that they are under no obligation to receive care at these facilities.  PASRR submitted to EDS on       PASRR number received on       Existing PASRR number confirmed on 08/16/15     FL2 transmitted to all facilities in geographic area requested by pt/family on 08/16/15     FL2 transmitted to all facilities within larger geographic area on       Patient informed that his/her managed care company has contracts with or will negotiate with certain facilities, including the following:        Yes   Patient/family informed of bed offers received.  Patient chooses bed at  [facility]     Physician recommends and patient chooses bed at      Patient to be transferred to  [facility]) on 04/21/2015.  Patient to be transferred to facility by [facility])     Patient family notified on 04/21/2015 of transfer.  Name of family member notified:   Wife:  Clovis Punna Bigley and Son Tresa MooreRobert Mowers     PHYSICIAN Please prepare priority discharge summary,  including medications, Please sign FL2, Please sign DNR, Please prepare prescriptions     Additional Comment: Patient's care needs continued to deteriorate and MD changed d/c plan to residential hospice.  [facility] and family requested return there. Referral in place and bed offer received.  Ok per MD [facility]. Nursing notified to call report. Patient's wife and son - Molly MaduroRobert are very pleased with d/c plan.  Patient is not alert or aware of surroundings.  No further CSW needs identified. CSW signing off.  Lorri Frederickonna T. Andria RheinCrowder, LCSW [phone]      _______________________________________________ Darylene Pricerowder, Paysen Goza T, LCSW 2015-08-30, 10:48 AM

## 2015-08-26 NOTE — Discharge Summary (Signed)
Physician Discharge Summary  Randeep Biondolillo DJM:[id number] DOB: [birth year] DOA: 08/09/2015  PCP: Loura Pardon, MD  Admit date: 08/09/2015 Discharge date: 08/21/2015  The patient will be discharged to residential hospice with focus on comfort care.  Discharge Diagnoses:  Acute on chronic respiratory failure with hypoxia and hypercapnia Combination of factors: Decompensated CHF, COPD exacerbation, pulmonary hypertension, possible cardio renal syndrome. Patient was initially diuresed: As the patient's creatinine was trending up Lasix was discontinued Treated underlying COPD exacerbation with DuoNeb nebs and Solu-Medrol. -08/25/2015--clinical status declined in the last 12 hours--oxygen saturation in the 70s overnight, placed on Venturi mask -no longer active issue as focus of patient's care transitioned to that focused on comfort -Patient is comfortable presently on 40% Venturi mask without any distress--> maintain current level of oxygenation at residential hospice  -certainly, if patient is more comfortable with nasal cannula this can be changed to nasal cannula 5 L  Community acquired pneumonia - Completed 10 days of antibiotics on 10/6. Treated --no longer active issue as focus of patient's care transitioned to that focused on comfort  COPD exacerbation -Treated with oxygen, bronchodilators, IV Solu-Medrol and Brovana. Completed a course of antibiotics. Exacerbation seems to have resolved. Transitioned IV Solu-Medrol to oral prednisone taper.  -Steroids discontinued as a result of transition of care to focus on comfort  -no longer active issue as focus of patient's care transitioned to that focused on comfort  Diptheroids in blood cx -Blood cultures positive Diphtheroids (Corynebacterium). -Most c/w contaminant - no further w/u at this time  --no longer active issue as focus of patient's care transitioned to that focused on comfort  Acute on chronic systolic CHF/pulmonary  hypertension -Echo 7/16: LVEF 35-40 percent - Treated with IV Lasix but BUN/creatinine started trending up and hence held.  - Intake output charting probably not accurate. - has features of volume overload (2+ pitting leg edema and increased JVP and chest x-ray consistent with pulmonary edema)  - Starting back diuretics 10/10 (Lasix 20 mg IV 1 dose). Monitor. Cardiology following. Patient hyperkalemic and creatinine worsening.  -Nephrology consulted by cardiology. --no longer active issue as focus of patient's care transitioned to that focused on comfort  Newly diagnosed paroxysmal A. fib with RVR -New onset this admission - Loaded with IV amiodarone on 08/15/15 which was converted to oral amiodarone on 10/6  - Cardiology following. From records, not on anticoagulants secondary to thrombocytopenia. Continued on home Plavix and aspirin  - Was in A. fib with RVR 10/8 AM but reverted to sinus rhythm.  - As per cardiology, continue amiodarone 400 MG twice a day until 10/13 then switch to 200 MG daily. Also on metoprolol 25 MG every 6 hours - Has reverted back to A. fib with RVR in the 100s on 10/11 AM. --no longer active issue as focus of patient's care transitioned to that focused on comfort  Acute on stage III chronic kidney disease - Baseline creatinine may be in the 1.6-1.8 range - Creatinine worsened from 1.8 on 10/7 to 2.>2.5 and diuretics were held. May be cardiorenal syndrome.  - Received a dose of Lasix IV 20 mg 1 on 10/10 and creatinine worsened to 2.9.  - Nephrology consulted and recommend palliative care input. He is not a candidate for dialysis. --no longer active issue as focus of patient's care transitioned to that focused on comfort  Hyperkalemia - Secondary to worsening renal functions. Status post Kayexalate and high-dose Lasix started at 80 mg IV twice a day.  --no longer active issue  as focus of patient's care transitioned to that focused on comfort  Essential  hypertension - Controlled --no longer active issue as focus of patient's care transitioned to that focused on comfort CAD native artery - Asymptomatic  - Continue aspirin 81 mg daily and Plavix 75 mg daily -no longer active issue as focus of patient's care transitioned to that focused on comfort  BPH Continue Flomax. -no longer active issue as focus of patient's care transitioned to that focused on comfort Dyslipidemia - Continue 20 mg QHS  Protein calorie malnutrition/Anorexia -Continue Megace 800 mg Daily Acute on chronic respiratory failure with hypoxia and hypercapnia Combination of factors: Decompensated CHF, COPD exacerbation, pulmonary hypertension, possible cardio renal syndrome. Patient was initially diuresed: As the patient's creatinine was trending up Lasix was discontinued Treated underlying COPD exacerbation with DuoNeb nebs and Solu-Medrol. -08/25/2015--clinical status declined in the last 12 hours--oxygen saturation in the 70s overnight, placed on Venturi mask -no longer active issue as focus of patient's care transitioned to that focused on comfort  Community acquired pneumonia - Completed 10 days of antibiotics on 10/6. Treated --no longer active issue as focus of patient's care transitioned to that focused on comfort  COPD exacerbation -Treated with oxygen, bronchodilators, IV Solu-Medrol and Brovana. Completed a course of antibiotics. Exacerbation seems to have resolved. Transitioned IV Solu-Medrol to oral prednisone taper.  -no longer active issue as focus of patient's care transitioned to that focused on comfort  Diptheroids in blood cx -Blood cultures positive Diphtheroids (Corynebacterium). -Most c/w contaminant - no further w/u at this time  --no longer active issue as focus of patient's care transitioned to that focused on comfort  Acute on chronic systolic CHF/pulmonary hypertension -Echo 7/16: LVEF 35-40 percent - Treated with IV Lasix but  BUN/creatinine started trending up and hence held.  - Intake output charting probably not accurate. - has features of volume overload (2+ pitting leg edema and increased JVP and chest x-ray consistent with pulmonary edema)  - Starting back diuretics 10/10 (Lasix 20 mg IV 1 dose). Monitor. Cardiology following. Patient hyperkalemic and creatinine worsening.  -Nephrology consulted by cardiology. --no longer active issue as focus of patient's care transitioned to that focused on comfort  Newly diagnosed paroxysmal A. fib with RVR -New onset this admission - Loaded with IV amiodarone on 08/15/15 which was converted to oral amiodarone on 10/6  - Cardiology following. From records, not on anticoagulants secondary to thrombocytopenia. Continued on home Plavix and aspirin  - Was in A. fib with RVR 10/8 AM but reverted to sinus rhythm.  - As per cardiology, continue amiodarone 400 MG twice a day until 10/13 then switch to 200 MG daily. Also on metoprolol 25 MG every 6 hours - Has reverted back to A. fib with RVR in the 100s on 10/11 AM. --no longer active issue as focus of patient's care transitioned to that focused on comfort  Acute on stage III chronic kidney disease - Baseline creatinine may be in the 1.6-1.8 range - Creatinine worsened from 1.8 on 10/7 to 2.>2.5 and diuretics were held. May be cardiorenal syndrome.  - Received a dose of Lasix IV 20 mg 1 on 10/10 and creatinine worsened to 2.9.  - Nephrology consulted and recommend palliative care input. He is not a candidate for dialysis. --no longer active issue as focus of patient's care transitioned to that focused on comfort  Hyperkalemia - Secondary to worsening renal functions. Status post Kayexalate and high-dose Lasix started at 80 mg IV twice a day.  --  no longer active issue as focus of patient's care transitioned to that focused on comfort  Essential hypertension - Controlled --no longer active issue as focus of  patient's care transitioned to that focused on comfort CAD native artery - Asymptomatic  - Continue aspirin 81 mg daily and Plavix 75 mg daily -no longer active issue as focus of patient's care transitioned to that focused on comfort  BPH Continue Flomax. -no longer active issue as focus of patient's care transitioned to that focused on comfort Dyslipidemia - Continue 20 mg QHS -no longer active issue as focus of patient's care transitioned to that focused on comfort Protein calorie malnutrition/Anorexia -Continue Megace 800 mg Daily -no longer active issue as focus of patient's care transitioned to that focused on comfort Chronic anemia - Possibly of chronic disease. Stable  Thrombocytopenia - due to acute medical condition  Leukocytosis - May be from steroids.   Adult failure to thrive/Plan of care Per previous records patient's wife has been met by the palliative care as well as the primary physician's and expressed interest for DO NOT RESUSCITATE. However patient's wife wants to keep the patient on full code. Previous rounding [facility] MD's had long conversation with patient's 2 sons, wife and other family members explained about the patient's poor prognosis considering multiorgan failure, gradual decline.  -08/25/2015--long discussion with the patient's wife and son at the bedside--discussed the patient's poor prognosis and continued clinical decline despite maximal therapy; case discussed with heart failure team -Long discussion regarding the patient's trajectory over the last several weeks and continue progressive decline; patient continues to decline clinically with continued signs of multiorgan system failure despite maximal medical therapy  -Cardiology and nephrology have recommended DO NOT RESUSCITATE with focus on comfort care  -pt's son and wife acknowledge that pt is continuing to decline and showing distress and struggling to breath this am -Wife and son have decided to  make patient DO NOT RESUSCITATE with focus on comfort care -Discontinue all blood draws, discontinue radiographs -Discontinue all nonessential medications  Anasarca - Multifactorial secondary to hypoalbuminemia, decompensated CHF, worsening renal insufficiency. Worsening renal function on diuresis. Lasix dose increased 10/11. Follow clinically. Chronic anemia - Possibly of chronic disease. Stable  Thrombocytopenia - Unclear etiology but stable  Leukocytosis - May be from steroids.   Adult failure to thrive/Plan of care Per previous records patient's wife has been met by the palliative care as well as the primary physician's and expressed interest for DO NOT RESUSCITATE. However patient's wife wants to keep the patient on full code. Previous rounding [facility] MD's had long conversation with patient's 2 sons, wife and other family members explained about the patient's poor prognosis considering multiorgan failure, gradual decline.  -08/25/2015--long discussion with the patient's wife and son at the bedside--discussed the patient's poor prognosis and continued clinical decline despite maximal therapy; case discussed with heart failure team -Long discussion regarding the patient's trajectory over the last several weeks and continue progressive decline; patient continues to decline clinically with continued signs of multiorgan system failure despite maximal medical therapy  -Cardiology and nephrology have recommended DO NOT RESUSCITATE with focus on comfort care  -pt's son and wife acknowledge that pt is continuing to decline and showing distress and struggling to breath this am -Wife and son have decided to make patient DO NOT RESUSCITATE with focus on comfort care -Discontinue all blood draws, discontinue radiographs -Discontinue all nonessential medications  Anasarca - Multifactorial secondary to hypoalbuminemia, decompensated CHF, worsening renal insufficiency. Worsening renal function on  diuresis. Lasix dose increased 10/11. Follow clinically.  Discharge Condition: stable  Disposition:  residential hospice   Diet:comfort feeds Wt Readings from Last 3 Encounters:  09/04/2015 87.9 kg (193 lb 12.6 oz)  07/20/15 83.462 kg (184 lb)  07/14/15 91.627 kg (202 lb)    History of present illness:  81 y.o.WM with history of chronic respiratory failure on home oxygen 3 L/m, COPD, CAD, HTN, chronic systolic CHF, HLD, CKD was admitted on 07/20/2015 with the complaints of progressive decline over the last few months, loss of weight of 60 pounds, chronic productive cough and shortness of breath. At baseline patient uses a walker. Patient has been having gradual decline. Had 3 falls since June/2016. His hospitalization has been complicated by new onset paroxysmal A. fib with RVR, acute on chronic kidney disease which is limiting treatment of his decompensated CHF/possible cardiorenal syndrome. Cardiology continues to follow. Nephrology consulted and recommend palliative to be really consulted for input due to decline and poor prognosis-called palliative. The patient was seen by Dr. Domingo Cocking. With his assistance and discussion with the family, the patient was transitioned to comfort care. The patient was stabilized and transitioned to residential hospice.  Consultants: Palliative medicine--Dr. Domingo Cocking Cardiology Nephrology  Discharge Exam: Filed Vitals:   09/07/2015 0529  BP: 134/60  Pulse: 99  Temp: 97.9 F (36.6 C)  Resp: 16   Filed Vitals:   08/25/15 1228 08/25/15 2150 09/01/2015 0529 08/22/2015 0748  BP: 98/54 111/61 134/60   Pulse: 95 63 99   Temp: 97.5 F (36.4 C) 97.6 F (36.4 C) 97.9 F (36.6 C)   TempSrc: Oral Oral Oral   Resp: _0 Height:      Weight:   87.9 kg (193 lb 12.6 oz)   SpO2: 98% 93% 91% 90%   General:awake and alert, NAD, pleasant, cooperative Cardiovascular: IRRR, no rub, no gallop, no S3 Respiratory: bibasilar crackles Abdomen:soft, nontender,  nondistended, positive bowel sounds Extremities: 1+ LE edema, No lymphangitis, no petechiae  Discharge Instructions      Discharge Instructions    Diet - low sodium heart healthy    Complete by:  As directed      Increase activity slowly    Complete by:  As directed             Medication List    STOP taking these medications        amLODipine 5 MG tablet  Commonly known as:  NORVASC     aspirin 81 MG tablet     calcium carbonate 500 MG chewable tablet  Commonly known as:  TUMS - dosed in mg elemental calcium     cholecalciferol 1000 UNITS tablet  Commonly known as:  VITAMIN D     clopidogrel 75 MG tablet  Commonly known as:  PLAVIX     diphenhydrAMINE 25 MG tablet  Commonly known as:  SOMINEX     furosemide 80 MG tablet  Commonly known as:  LASIX     isosorbide mononitrate 30 MG 24 hr tablet  Commonly known as:  IMDUR     metoprolol succinate 100 MG 24 hr tablet  Commonly known as:  TOPROL-XL     nitroGLYCERIN 0.4 MG SL tablet  Commonly known as:  NITROSTAT     NON FORMULARY     ranolazine 500 MG 12 hr tablet  Commonly known as:  RANEXA     simvastatin 20 MG tablet  Commonly known as:  ZOCOR     tamsulosin  0.4 MG Caps capsule  Commonly known as:  FLOMAX     tiotropium 18 MCG inhalation capsule  Commonly known as:  SPIRIVA HANDIHALER     vitamin B-12 1000 MCG tablet  Commonly known as:  CYANOCOBALAMIN      TAKE these medications        glycopyrrolate 0.2 MG/ML injection  Commonly known as:  ROBINUL  Inject 1 mL (0.2 mg total) into the skin every 4 (four) hours as needed (excessive secretions).     haloperidol 2 MG/ML solution  Commonly known as:  HALDOL  Place 1 mL (2 mg total) under the tongue every 4 (four) hours as needed for agitation (or delirium).     HYDROmorphone 1 MG/ML injection  Commonly known as:  DILAUDID  Inject 0.3 mLs (0.3 mg total) into the vein every 2 (two) hours as needed for severe pain (or dyspnea).     LORazepam 2  MG/ML injection  Commonly known as:  ATIVAN  Inject 0.5 mLs (1 mg total) into the vein every 4 (four) hours as needed for anxiety or sedation.         The results of significant diagnostics from this hospitalization (including imaging, microbiology, ancillary and laboratory) are listed below for reference.    Significant Diagnostic Studies: Dg Chest 2 View  08/23/2015  CLINICAL DATA:  Dyspnea, hypoxia. EXAM: CHEST  2 VIEW COMPARISON:  August 15, 2015. FINDINGS: Stable cardiomegaly with central pulmonary vascular congestion is noted. Stable bilateral pleural effusions are noted. Stable interstitial densities are noted throughout both lungs most consistent with edema or possibly pneumonia. Bony thorax is unremarkable. IMPRESSION: Stable cardiomegaly and probable bilateral pulmonary edema with associated pleural effusions. Electronically Signed   By: Marijo Conception, M.D.   On: 08/23/2015 11:59   Dg Chest 2 View  08/10/2015  CLINICAL DATA:  Shortness of breath EXAM: CHEST  2 VIEW COMPARISON:  07/20/2015 FINDINGS: Progressive bibasilar opacity suggesting pneumonia. Small bilateral pleural effusion. Interstitial coarsening with cephalized blood flow, increased from prior. Chronic cardiomegaly. Background of hyperinflation bronchitic changes. IMPRESSION: 1. Progressive bibasilar airspace disease, primarily concerning for pneumonia. 2. Superimposed CHF pattern. 3. Background COPD. Electronically Signed   By: Monte Fantasia M.D.   On: 08/10/2015 00:53   Ct Head Wo Contrast  08/10/2015  CLINICAL DATA:  Increasing shortness of breath, weakness and decreased urinary output. Oxygen desaturation. History of alcohol and tobacco use, chronic kidney disease, thrombocytopenia, hypertension. EXAM: CT HEAD WITHOUT CONTRAST TECHNIQUE: Contiguous axial images were obtained from the base of the skull through the vertex without intravenous contrast. COMPARISON:  CT head May 05, 2015 FINDINGS: Moderate to severe  ventriculomegaly, on the basis of global parenchymal brain volume loss, unchanged from prior imaging. No intraparenchymal hemorrhage, mass effect nor midline shift. Patchy supratentorial white matter hypodensities are less than expected for patient's age and though non-specific suggest sequelae of chronic small vessel ischemic disease. No acute large vascular territory infarcts. No abnormal extra-axial fluid collections. Basal cisterns are patent. Moderate calcific atherosclerosis of the carotid siphons. No skull fracture. The included ocular globes and orbital contents are non-suspicious. Status post bilateral ocular lens implants. Near complete opacification LEFT sphenoid sinus, small RIGHT sphenoid sinus air-fluid level. IMPRESSION: No acute intracranial process. Stable chronic changes including moderate to severe global brain atrophy. Worsening LEFT greater than RIGHT sphenoid sinusitis. Electronically Signed   By: Elon Alas M.D.   On: 08/10/2015 00:31   US Renal  08/24/2015  CLINICAL DATA:  Acute renal failure  EXAM: RENAL / URINARY TRACT ULTRASOUND COMPLETE COMPARISON:  None. FINDINGS: Right Kidney: Length: 11.1 cm. Echogenicity within normal limits. There is mild cortical thinning. No hydronephrosis visualized. There is a 0.9 x 0.9 x 1 cm cyst. Left Kidney: Length: 11.1 cm. Echogenicity within normal limits. There is mild cortical thinning. No mass or hydronephrosis visualized. Bladder: Appears normal for degree of bladder distention. IMPRESSION: Mild cortical renal thinning bilaterally. Small cyst in the right kidney. Electronically Signed   By: Abelardo Diesel M.D.   On: 08/24/2015 14:59   Dg Chest Port 1 View  08/15/2015  CLINICAL DATA:  Pneumonia EXAM: PORTABLE CHEST 1 VIEW COMPARISON:  08/13/2015 FINDINGS: Bilateral airspace disease right greater than left. Mild improvement in right-sided airspace disease. Moderate right pleural effusion also unchanged. Small left effusion. Bibasilar  atelectasis Cardiac enlargement with vascular congestion. IMPRESSION: Bilateral airspace disease right greater than left. Mild improvement on the right. Findings could be due to congestive heart failure and edema versus pneumonia. Electronically Signed   By: Franchot Gallo M.D.   On: 08/15/2015 07:10   Dg Chest Port 1 View  08/13/2015  CLINICAL DATA:  Pneumonia EXAM: PORTABLE CHEST 1 VIEW COMPARISON:  08/10/2015 FINDINGS: Right greater than left hazy airspace consolidation is increased since previously. Posteriorly tracking moderate right and small left pleural effusions are noted. Mild enlargement of cardiomediastinal silhouette is identified with central vascular congestion. Hiatal hernia reidentified versus left hemidiaphragmatic eventration. IMPRESSION: Increased bibasilar hazy airspace opacities which could represent pneumonia although alveolar edema or other filling processes such as ARDS could appear similar. Electronically Signed   By: Conchita Paris M.D.   On: 08/13/2015 07:22     Microbiology: No results found for this or any previous visit (from the past 240 hour(s)).   Labs: Basic Metabolic Panel:  Recent Labs Lab 08/20/15 0855 08/21/15 0358 08/23/15 0334 08/24/15 0514 08/24/15 1554 08/25/15 0348  NA 140 141 138 137 139 137  K 4.1 4.0 4.9 6.0* 5.2* 5.0  CL 87* 86* 83* 84* 85* 81*  CO2 47* 46* 47* 43* 44* 45*  GLUCOSE 139* 104* 147* 116* 160* 130*  BUN 55* 65* 86* 101* 105* 96*  CREATININE 1.89* 2.24* 2.52* 2.90* 3.04* 2.94*  CALCIUM 8.6* 8.6* 9.0 9.1 8.8* 8.8*  MG 1.7 1.9  --   --   --   --   PHOS  --   --   --   --   --  4.0   Liver Function Tests:  Recent Labs Lab 08/20/15 0855 08/21/15 0358 08/25/15 0348  AST 20 15  --   ALT 8* 6*  --   ALKPHOS 40 38  --   BILITOT 1.2 1.1  --   PROT 5.0* 4.7*  --   ALBUMIN 2.6* 2.4* 2.9*   No results for input(s): LIPASE, AMYLASE in the last 168 hours. No results for input(s): AMMONIA in the last 168  hours. CBC:  Recent Labs Lab 08/20/15 0855 08/21/15 0358 08/23/15 0334 08/25/15 0348  WBC 17.2* 12.7* 12.0* 13.6*  NEUTROABS 14.9*  --   --   --   HGB 12.0* 11.3* 10.2* 11.2*  HCT 38.6* 35.6* 32.7* 36.0*  MCV 107.8* 109.2* 107.6* 108.4*  PLT 81* 70* 95* 122*   Cardiac Enzymes: No results for input(s): CKTOTAL, CKMB, CKMBINDEX, TROPONINI in the last 168 hours. BNP: Invalid input(s): POCBNP CBG:  Recent Labs Lab 08/23/15 1712  GLUCAP 134*    Time coordinating discharge:  Greater than 30 minutes  Signed:  Cledith Kamiya, DO [facility] Pager: [phone] 09/08/2015, 9:23 AM

## 2015-08-26 NOTE — Progress Notes (Signed)
[facility] Report called to nurse.

## 2015-08-26 NOTE — Progress Notes (Signed)
Consultation Note Date: 08/25/2015   Patient Name: Bill Walker  DOB: [birth year]  MRN: [id number]  Age / Sex: 81 y.o., male   PCP: Abner Greenspan, MD Referring Physician: Orson Eva, MD  Reason for Consultation: Establishing goals of care and Terminal care  Palliative Care Assessment and Plan Summary of Established Goals of Care and Medical Treatment Preferences   Clinical Assessment/Narrative: 81 y.o.WM with history of chronic respiratory failure on home oxygen 3 L/m, COPD, CAD, HTN, chronic systolic CHF, HLD, CKD was admitted on 07/20/2015 with the complaints of progressive decline over the last few months, loss of weight of 60 pounds, chronic productive cough and shortness of breath. Hospitalization has been complicated by new onset paroxysmal A. fib with RVR, acute on chronic kidney disease which is limiting treatment of his decompensated CHF/possible cardiorenal syndrome. He is now comfort care only.  I met with the patient and his wife.  She reports that he had episode of coughing overnight, but otherwise has been doing well.  He appears to be stable today compared to yesterday, and his wife is agreeable to pursuing placement at [facility].  He was there once in the past and she reports that he received excellent care there.  - Continue comfort care. Family would like to pursue transfer to [facility] today. - Referral placed to Social Work  Symptom Management: Pain or Shortness of breath: Currently well controlled.   - Continue Dilaudid on as-needed basis.  Agitation/anxiety: Currently not an issue for patient.   - Continue Ativan as needed for anxiety - Continue Haldol as needed for agitation  Nausea: Currently not an issue for patient. - Continue Zofran as needed for nausea or vomiting  Secretions: Currently not an issue for patient.   - Continue Robinul as needed  Bowel regimen:   - Dulcolax as needed  Prognosis: Days. He is alert this morning but reported to be  sleeping more overall.  Based upon his renal failure and acute exacerbation of chronic respiratory failure, his anticipated course is likely in the matter of days.  Discharge Planning:  Pursuing [facility] at [facility] Complaint/History of Present Illness:   Patient remains confused.  He denies any pain shortness of breath nausea or vomiting. He denies any other complaints  Primary Diagnoses  Present on Admission:  . COPD (chronic obstructive pulmonary disease) ([place]) . Essential hypertension . Chronic respiratory failure with hypercapnia ([place]) . Acute on chronic systolic CHF (congestive heart failure) ([place]) . Coronary atherosclerosis . Dyslipidemia . BPH (benign prostatic hyperplasia) . Anorexia . Loss of weight . CAP (community acquired pneumonia) . Palliative care encounter . DNR (do not resuscitate) discussion . Acute on chronic respiratory failure with hypoxia ([place]) . Pulmonary hypertension ([place]) . Acute pulmonary edema ([facility]) . CAD in native artery . Protein-calorie malnutrition ([place]) . COPD exacerbation ([place]) . Atrial fibrillation with RVR ([place])  Palliative Review of Systems: Denies complaints I have reviewed the medical record, interviewed the patient and family, and examined the patient. The following aspects are pertinent.  Past Medical History  Diagnosis Date  . Ischemic cardiomyopathy     a. 02/2005 EF 32% (myoview report).  . Hypertension   . Coronary artery disease     a. s/p prior MI w/ RCA stenting;  b. 03/2011 Cath: LM 20d, LAD 40p/100p, LCX 40p, L->L collats to distal LAD, RCA dominant, patent prox stent, 30mISR, 50-651m50-60d, 60d-->Med Rx;  c. 04/2015 NSTEMI/Cath: LM 45, LAD  80/36m 100d (L->L collats), D2 99ost, OM1 60, RCA patent stent prox, 658m70d, RPAV1/2 70->Med Rx.  . Marland Kitchenyperlipidemia   . Vitamin B12 deficiency   . History of pneumonia   . Thrombocytopenia   . Cor pulmonale   . Hearing loss   . Alcohol abuse, unspecified   .  Tobacco use disorder   . CKD (chronic kidney disease), stage III   . Osteoarthritis   . Gout   . Diverticulosis of colon   . COPD (chronic obstructive pulmonary disease)   . Anemia   . Bronchiectasis     CT Chest 07/07/09 bilateral lower lobe medial bronchiectasis  . Chronic systolic CHF (congestive heart failure)     a. 02/2005 EF 32% (myoview report).   Social History   Social History  . Marital Status: Married    Spouse Name: N/A  . Number of Children: N/A  . Years of Education: N/A   Occupational History  . Retired    Social History Main Topics  . Smoking status: Former Smoker -- 1.00 packs/day for 48 years    Quit date: 11/13/1996  . Smokeless tobacco: None  . Alcohol Use: No  . Drug Use: No  . Sexual Activity: Not Currently   Other Topics Concern  . None   Social History Narrative   [place]      Retired      No regular exercise due to co-morbidities      Cannot walk long distnaces   Family History  Problem Relation Age of Onset  . Lymphoma Brother   . Prostate cancer Brother    Scheduled Meds: . arformoterol  15 mcg Nebulization BID  . budesonide (PULMICORT) nebulizer solution  0.25 mg Nebulization BID  . sodium chloride  3 mL Intravenous Q12H  . sodium chloride  3 mL Intravenous Q12H   Continuous Infusions: . sodium chloride 10 mL/hr at 08/25/15 0627   PRN Meds:.sodium chloride, acetaminophen **OR** acetaminophen, antiseptic oral rinse, glycopyrrolate **OR** glycopyrrolate **OR** glycopyrrolate, haloperidol **OR** haloperidol **OR** haloperidol lactate, HYDROmorphone (DILAUDID) injection, levalbuterol, LORazepam **OR** [DISCONTINUED] LORazepam **OR** LORazepam, ondansetron **OR** ondansetron (ZOFRAN) IV, polyvinyl alcohol, sodium chloride Medications Prior to Admission:  Prior to Admission medications   Medication Sig Start Date End Date Taking? Authorizing Provider  amLODipine (NORVASC) 5 MG tablet Take 5 mg by mouth daily.  07/02/15  Yes Historical Provider, MD  aspirin 81 MG tablet Take 1 tablet (81 mg total) by mouth daily. 05/09/15  Yes ChRogelia MireNP  calcium carbonate (TUMS - DOSED IN MG ELEMENTAL CALCIUM) 500 MG chewable tablet Chew 1 tablet by mouth daily.   Yes Historical Provider, MD  cholecalciferol (VITAMIN D) 1000 UNITS tablet Take 1 tablet (1,000 Units total) by mouth daily. 04/01/13  Yes PeJosue HectorMD  clopidogrel (PLAVIX) 75 MG tablet TAKE ONE TABLET BY MOUTH ONCE DAILY 07/15/15  Yes MaAbner GreenspanMD  diphenhydrAMINE (SOMINEX) 25 MG tablet Take 50 mg by mouth at bedtime as needed for sleep.    Yes Historical Provider, MD  furosemide (LASIX) 80 MG tablet TAKE 1 TABLET BY MOUTH DAILY & TAKE 1/2 TAB BY MOUTH IN THE EVENINGS ON Monday, Wed & Friday ONLY.   Yes Historical Provider, MD  isosorbide mononitrate (IMDUR) 30 MG 24 hr tablet TAKE TWO TABLETS BY MOUTH ONCE DAILY 06/21/15  Yes MaAbner GreenspanMD  metoprolol succinate (TOPROL-XL) 100 MG 24 hr tablet Take 1 tablet (100 mg total) by mouth daily.  Take with or immediately following a meal. 07/14/15  Yes Josue Hector, MD  nitroGLYCERIN (NITROSTAT) 0.4 MG SL tablet Place 0.4 mg under the tongue every 5 (five) minutes as needed for chest pain (up to 3 doses).   Yes Historical Provider, MD  ranolazine (RANEXA) 500 MG 12 hr tablet Take 1 tablet (500 mg total) by mouth 2 (two) times daily. 05/09/15  Yes Rogelia Mire, NP  simvastatin (ZOCOR) 20 MG tablet TAKE ONE TABLET BY MOUTH AT BEDTIME 07/05/15  Yes Abner Greenspan, MD  tamsulosin (FLOMAX) 0.4 MG CAPS capsule Take 1 capsule (0.4 mg total) by mouth daily. 04/27/15  Yes Abner Greenspan, MD  tiotropium (SPIRIVA HANDIHALER) 18 MCG inhalation capsule INHALE ONE CAPSULE BY MOUTH ONCE DAILY 02/08/15  Yes Abner Greenspan, MD  vitamin B-12 (CYANOCOBALAMIN) 1000 MCG tablet Take 1,000 mcg by mouth daily.   Yes Historical Provider, MD  NON FORMULARY Oxygen- 3 lpm 24/7    Historical Provider, MD   Allergies    Allergen Reactions  . Doxazosin Mesylate Rash    rash  . Penicillins Other (See Comments)    REACTION: unknown reaction   CBC:    Component Value Date/Time   WBC 13.6* 08/25/2015 0348   WBC 7.6 12/05/2012 1124   WBC 8.1 09/01/2010 1539   HGB 11.2* 08/25/2015 0348   HGB 10.5* 12/05/2012 1124   HGB 12.6* 09/01/2010 1539   HCT 36.0* 08/25/2015 0348   HCT 31.7* 12/05/2012 1124   HCT 37.4* 09/01/2010 1539   PLT 122* 08/25/2015 0348   PLT 163 12/05/2012 1124   PLT 123* 09/01/2010 1539   MCV 108.4* 08/25/2015 0348   MCV 97.9 12/05/2012 1124   MCV 93 09/01/2010 1539   NEUTROABS 14.9* 08/20/2015 0855   NEUTROABS 5.8 12/05/2012 1124   NEUTROABS 6.6* 09/01/2010 1539   LYMPHSABS 1.0 08/20/2015 0855   LYMPHSABS 1.0 12/05/2012 1124   LYMPHSABS 0.8* 09/01/2010 1539   MONOABS 1.3* 08/20/2015 0855   MONOABS 0.6 12/05/2012 1124   EOSABS 0.0 08/20/2015 0855   EOSABS 0.2 12/05/2012 1124   EOSABS 0.1 09/01/2010 1539   BASOSABS 0.0 08/20/2015 0855   BASOSABS 0.0 12/05/2012 1124   BASOSABS 0.0 09/01/2010 1539   Comprehensive Metabolic Panel:    Component Value Date/Time   NA 137 08/25/2015 0348   NA 145 12/16/2009 1105   K 5.0 08/25/2015 0348   K 4.2 12/16/2009 1105   CL 81* 08/25/2015 0348   CL 102 12/16/2009 1105   CO2 45* 08/25/2015 0348   CO2 35* 12/16/2009 1105   BUN 96* 08/25/2015 0348   BUN 27* 12/16/2009 1105   CREATININE 2.94* 08/25/2015 0348   CREATININE 1.7* 12/16/2009 1105   GLUCOSE 130* 08/25/2015 0348   GLUCOSE 119* 12/16/2009 1105   CALCIUM 8.8* 08/25/2015 0348   CALCIUM 8.5 12/16/2009 1105   AST 15 08/21/2015 0358   AST 21 03/17/2009 1301   ALT 6* 08/21/2015 0358   ALT 9* 03/17/2009 1301   ALKPHOS 38 08/21/2015 0358   ALKPHOS 61 03/17/2009 1301   BILITOT 1.1 08/21/2015 0358   BILITOT 0.80 03/17/2009 1301   PROT 4.7* 08/21/2015 0358   PROT 5.8* 03/17/2009 1301   ALBUMIN 2.9* 08/25/2015 0348   ALBUMIN 3.3 03/17/2009 1301    Physical Exam: Vital Signs:  BP 134/60 mmHg  Pulse 99  Temp(Src) 97.9 F (36.6 C) (Oral)  Resp 16  Ht _0  (1.803 m)  Wt 87.9 kg (193 lb 12.6 oz)  BMI 27.04 kg/m2  SpO2 90% SpO2: SpO2: 90 % O2 Device: O2 Device: Venturi Mask O2 Flow Rate: O2 Flow Rate (L/min): 8 L/min Intake/output summary:   Intake/Output Summary (Last 24 hours) at 08/30/2015 0852 Last data filed at 08/24/2015 0600  Gross per 24 hour  Intake  365.5 ml  Output    951 ml  Net -585.5 ml   LBM: Last BM Date: 08/24/15 Baseline Weight: Weight: 83.462 kg (184 lb) Most recent weight: Weight: 87.9 kg (193 lb 12.6 oz)  Exam Findings:   General: Pt is alert but confused, follows some commands, not in acute distress, wearing nonrebreather  HEENT: No icterus, No thrush  Cardiovascular: Irregular +JVD  Respiratory: Bilateral crackles   Abdomen: Soft/+BS, non tender, non distended, no guarding  Extremities: 3+ LE edema, no rash         Palliative Performance Scale: 20             Additional Data Reviewed: Recent Labs     08/24/15  1554  08/25/15  0348  WBC   --   13.6*  HGB   --   11.2*  PLT   --   122*  NA  139  137  BUN  105*  96*  CREATININE  3.04*  2.94*     Time In: 0820 Time Out: 0850 Time Total: 30 Greater than 50%  of this time was spent counseling and coordinating care related to the above assessment and plan.  Signed by: Micheline Rough, MD  Micheline Rough, MD  08/15/2015, 8:52 AM  Please contact Palliative Medicine Team phone at [phone] for questions and concerns.

## 2015-09-14 DEATH — deceased

## 2015-10-01 IMAGING — NM NM MYOCAR SINGLE W/SPECT W/WALL MOTION & EF
1 series · 6 of 6 positions shown · non-contrast
Comparison: none

[Series 1: rest · 8.28mm/px · 6 of 64 frames shown]
[frame 6/64]
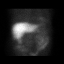
[frame 16/64]
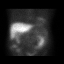
[frame 27/64]
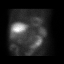
[frame 38/64]
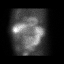
[frame 48/64]
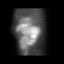
[frame 59/64]
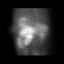

[6 of 6 positions shown; findings below may reference images not displayed]

Canned report from images found in remote index.

Refer to host system for actual result text.

## 2016-01-20 IMAGING — DX DG CHEST 2V
2 series · 2 of 2 positions shown · non-contrast
Comparison: August 15, 2015.

CLINICAL DATA: Dyspnea, hypoxia.

EXAM:
CHEST  2 VIEW

[x chest ap]
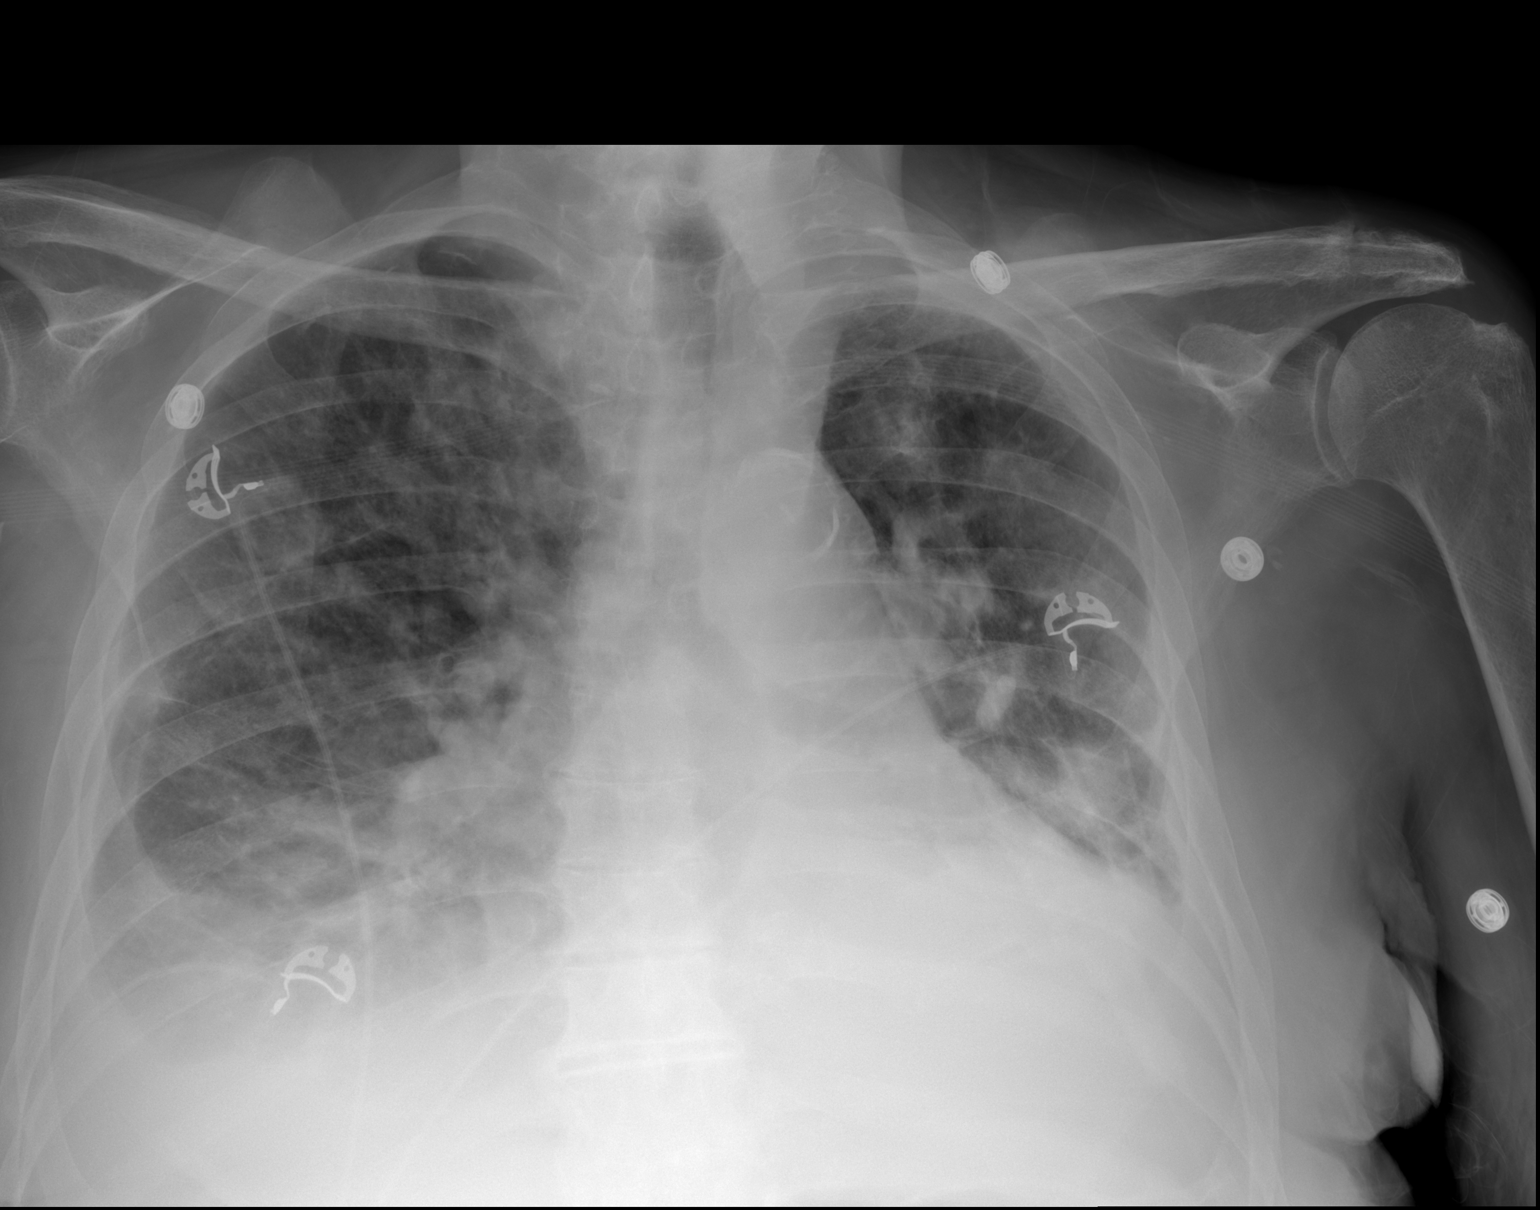

[w chest lat]
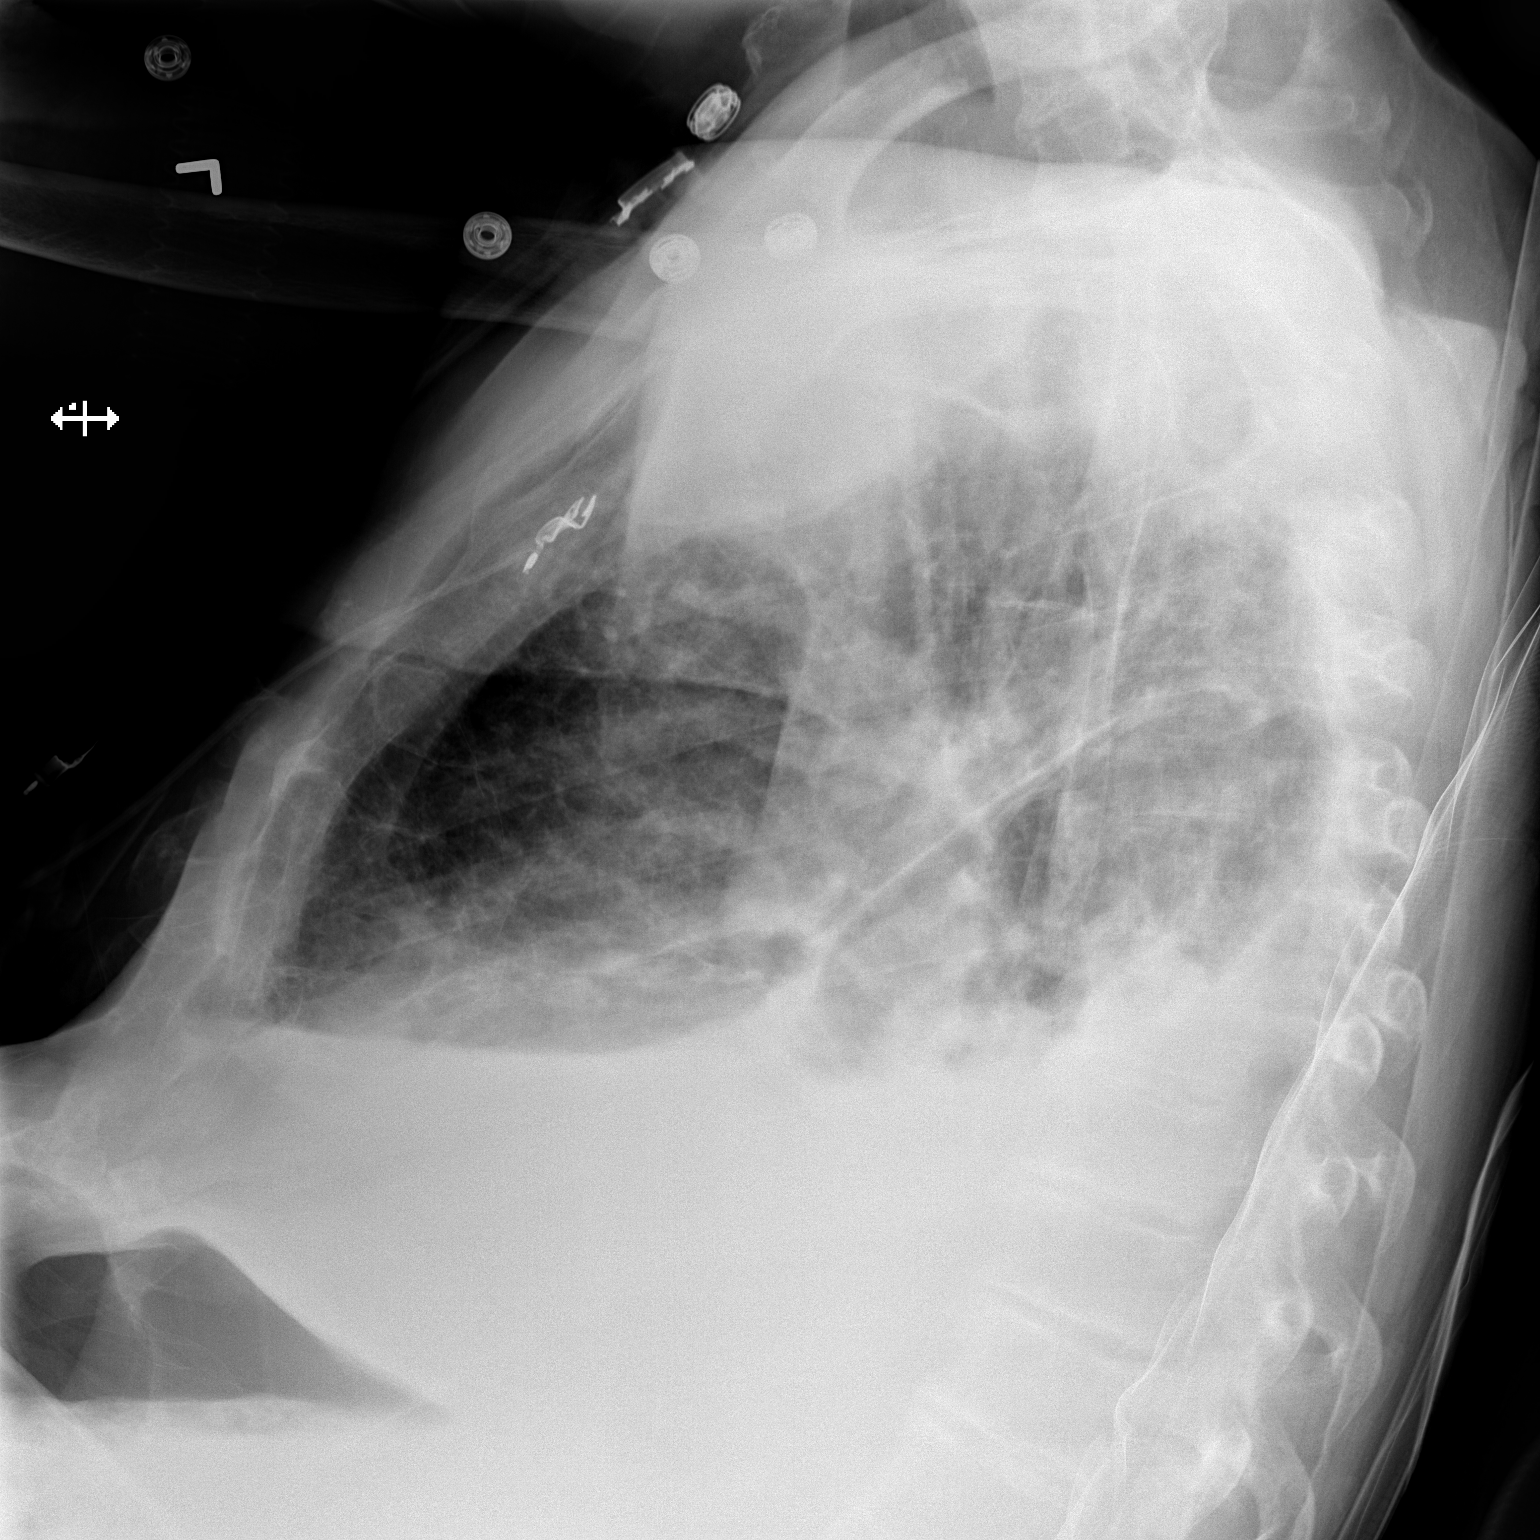

[2 of 2 positions shown; findings below may reference images not displayed]

FINDINGS: Stable cardiomegaly with central pulmonary vascular congestion is
noted. Stable bilateral pleural effusions are noted. Stable
interstitial densities are noted throughout both lungs most
consistent with edema or possibly pneumonia. Bony thorax is
unremarkable.
IMPRESSION: Stable cardiomegaly and probable bilateral pulmonary edema with
associated pleural effusions.
